# Patient Record
Sex: Female | Born: 1970 | Race: White | Hispanic: No | Marital: Married | Smoking: Former smoker
Health system: Southern US, Community
[De-identification: ages and names within clinical notes are randomized; demographics above are authoritative.]

## PROBLEM LIST (undated history)

## (undated) DIAGNOSIS — F329 Major depressive disorder, single episode, unspecified: Secondary | ICD-10-CM

## (undated) DIAGNOSIS — T7840XA Allergy, unspecified, initial encounter: Secondary | ICD-10-CM

## (undated) DIAGNOSIS — E1169 Type 2 diabetes mellitus with other specified complication: Secondary | ICD-10-CM

## (undated) DIAGNOSIS — R519 Headache, unspecified: Secondary | ICD-10-CM

## (undated) DIAGNOSIS — I671 Cerebral aneurysm, nonruptured: Secondary | ICD-10-CM

## (undated) DIAGNOSIS — Q282 Arteriovenous malformation of cerebral vessels: Secondary | ICD-10-CM

## (undated) DIAGNOSIS — F419 Anxiety disorder, unspecified: Secondary | ICD-10-CM

## (undated) DIAGNOSIS — Z8619 Personal history of other infectious and parasitic diseases: Secondary | ICD-10-CM

## (undated) DIAGNOSIS — K219 Gastro-esophageal reflux disease without esophagitis: Secondary | ICD-10-CM

## (undated) DIAGNOSIS — R51 Headache: Secondary | ICD-10-CM

## (undated) DIAGNOSIS — E039 Hypothyroidism, unspecified: Secondary | ICD-10-CM

## (undated) DIAGNOSIS — M179 Osteoarthritis of knee, unspecified: Secondary | ICD-10-CM

## (undated) DIAGNOSIS — E079 Disorder of thyroid, unspecified: Secondary | ICD-10-CM

## (undated) DIAGNOSIS — G4730 Sleep apnea, unspecified: Secondary | ICD-10-CM

## (undated) DIAGNOSIS — G43909 Migraine, unspecified, not intractable, without status migrainosus: Secondary | ICD-10-CM

## (undated) DIAGNOSIS — H5440 Blindness, one eye, unspecified eye: Secondary | ICD-10-CM

## (undated) DIAGNOSIS — F32A Depression, unspecified: Secondary | ICD-10-CM

## (undated) DIAGNOSIS — G629 Polyneuropathy, unspecified: Secondary | ICD-10-CM

## (undated) DIAGNOSIS — I10 Essential (primary) hypertension: Secondary | ICD-10-CM

## (undated) DIAGNOSIS — M1710 Unilateral primary osteoarthritis, unspecified knee: Secondary | ICD-10-CM

## (undated) HISTORY — DX: Allergy, unspecified, initial encounter: T78.40XA

## (undated) HISTORY — PX: OTHER SURGICAL HISTORY: SHX169

## (undated) HISTORY — DX: Polyneuropathy, unspecified: G62.9

## (undated) HISTORY — DX: Personal history of other infectious and parasitic diseases: Z86.19

## (undated) HISTORY — DX: Disorder of thyroid, unspecified: E07.9

## (undated) HISTORY — DX: Migraine, unspecified, not intractable, without status migrainosus: G43.909

---

## 2000-07-26 ENCOUNTER — Encounter: Payer: Self-pay | Admitting: Orthopedic Surgery

## 2000-07-26 ENCOUNTER — Ambulatory Visit (HOSPITAL_COMMUNITY): Admission: RE | Admit: 2000-07-26 | Discharge: 2000-07-26 | Payer: Self-pay | Admitting: Orthopedic Surgery

## 2000-08-06 ENCOUNTER — Encounter: Admission: RE | Admit: 2000-08-06 | Discharge: 2000-09-17 | Payer: Self-pay | Admitting: Internal Medicine

## 2000-12-10 ENCOUNTER — Encounter: Payer: Self-pay | Admitting: Internal Medicine

## 2000-12-10 ENCOUNTER — Ambulatory Visit (HOSPITAL_COMMUNITY): Admission: RE | Admit: 2000-12-10 | Discharge: 2000-12-10 | Payer: Self-pay | Admitting: Internal Medicine

## 2002-01-07 ENCOUNTER — Encounter: Payer: Self-pay | Admitting: Emergency Medicine

## 2002-01-07 ENCOUNTER — Emergency Department (HOSPITAL_COMMUNITY): Admission: EM | Admit: 2002-01-07 | Discharge: 2002-01-07 | Payer: Self-pay | Admitting: Emergency Medicine

## 2002-01-20 ENCOUNTER — Encounter: Admission: RE | Admit: 2002-01-20 | Discharge: 2002-04-04 | Payer: Self-pay | Admitting: Sports Medicine

## 2002-06-07 HISTORY — PX: DILATION AND CURETTAGE OF UTERUS: SHX78

## 2004-01-11 ENCOUNTER — Emergency Department (HOSPITAL_COMMUNITY): Admission: EM | Admit: 2004-01-11 | Discharge: 2004-01-11 | Payer: Self-pay | Admitting: Emergency Medicine

## 2006-05-26 ENCOUNTER — Encounter: Admission: RE | Admit: 2006-05-26 | Discharge: 2006-06-08 | Payer: Self-pay | Admitting: Orthopedic Surgery

## 2009-03-03 LAB — HM DIABETES EYE EXAM

## 2009-11-23 ENCOUNTER — Observation Stay (HOSPITAL_COMMUNITY): Admission: EM | Admit: 2009-11-23 | Discharge: 2009-11-24 | Payer: Self-pay | Admitting: Emergency Medicine

## 2009-11-23 ENCOUNTER — Ambulatory Visit: Payer: Self-pay | Admitting: Family Medicine

## 2009-12-12 ENCOUNTER — Telehealth: Payer: Self-pay | Admitting: Family Medicine

## 2010-04-02 NOTE — Progress Notes (Signed)
  Phone Note From Pharmacy   Caller: Kalman Jewels- Pharmacist @ [facility] Summary of Call: Karrie Meres pharmacist from [place] called about a rx written by you on this patient. Isosobide Mononitrate is written as needed, pharmacist states that that is usually dosed 2 times per day. SAhe was wondering if you wanted the sublingual type that is instant relief or if you are wanting the 2 times a day dose. phone number is [phone]. Any pharmacist can take the rx info. Referre3nce # is [id number]. Initial call taken by: Jimmy Footman, CMA,  December 12, 2009 4:12 PM  Follow-up for Phone Call        I texted md as [facility] has called again & they want an answer asap Follow-up by: Golden Circle RN,  December 13, 2009 3:48 PM    New/Updated Medications: NITROSTAT 0.4 MG SUBL (NITROGLYCERIN) take up to 3 tab q28min for chest pain. Prescriptions: NITROSTAT 0.4 MG SUBL (NITROGLYCERIN) take up to 3 tab q7min for chest pain.  #3 x 0   Entered and Authorized by:   Khamron Gellert de Lawson Radar  MD   Signed by:   Barnabas Lister  MD on 12/14/2009   Method used:   Telephoned to ...         RxID:   [id number]

## 2010-05-16 LAB — URINALYSIS, ROUTINE W REFLEX MICROSCOPIC
Glucose, UA: NEGATIVE mg/dL
Nitrite: NEGATIVE
pH: 8 (ref 5.0–8.0)

## 2010-05-16 LAB — CBC
HCT: 39.5 % (ref 36.0–46.0)
HCT: 41.6 % (ref 36.0–46.0)
Hemoglobin: 12.3 g/dL (ref 12.0–15.0)
MCH: 24.5 pg — ABNORMAL LOW (ref 26.0–34.0)
MCHC: 31.1 g/dL (ref 30.0–36.0)
MCV: 75.4 fL — ABNORMAL LOW (ref 78.0–100.0)
Platelets: 273 10*3/uL (ref 150–400)
RBC: 5.18 MIL/uL — ABNORMAL HIGH (ref 3.87–5.11)
RBC: 5.52 MIL/uL — ABNORMAL HIGH (ref 3.87–5.11)
RDW: 18.5 % — ABNORMAL HIGH (ref 11.5–15.5)
WBC: 9.2 10*3/uL (ref 4.0–10.5)

## 2010-05-16 LAB — POCT CARDIAC MARKERS
CKMB, poc: <1 ng/mL — ABNORMAL LOW (ref 1.0–8.0)
Myoglobin, poc: 101 ng/mL (ref 12–200)
Troponin i, poc: <0.05 ng/mL (ref 0.00–0.09)
Troponin i, poc: <0.05 ng/mL (ref 0.00–0.09)

## 2010-05-16 LAB — RAPID URINE DRUG SCREEN, HOSP PERFORMED
Barbiturates: NOT DETECTED
Benzodiazepines: POSITIVE — AB
Cocaine: NOT DETECTED

## 2010-05-16 LAB — TSH: TSH: 4.678 u[IU]/mL — ABNORMAL HIGH (ref 0.350–4.500)

## 2010-05-16 LAB — CARDIAC PANEL(CRET KIN+CKTOT+MB+TROPI)
CK, MB: 0.5 ng/mL (ref 0.3–4.0)
CK, MB: 0.6 ng/mL (ref 0.3–4.0)
Total CK: 50 U/L (ref 7–177)
Total CK: 57 U/L (ref 7–177)
Troponin I: 0.01 ng/mL (ref 0.00–0.06)

## 2010-05-16 LAB — LIPID PANEL
LDL Cholesterol: 86 mg/dL (ref 0–99)
VLDL: 22 mg/dL (ref 0–40)

## 2010-05-16 LAB — BASIC METABOLIC PANEL
BUN: 12 mg/dL (ref 6–23)
CO2: 25 mEq/L (ref 19–32)
Chloride: 109 mEq/L (ref 96–112)
GFR calc non Af Amer: >60 mL/min (ref >60)
Glucose, Bld: 136 mg/dL — ABNORMAL HIGH (ref 70–99)
Potassium: 4.2 mEq/L (ref 3.5–5.1)
Sodium: 140 mEq/L (ref 135–145)

## 2010-05-16 LAB — POCT I-STAT, CHEM 8
BUN: 10 mg/dL (ref 6–23)
Calcium, Ion: 1.04 mmol/L — ABNORMAL LOW (ref 1.12–1.32)
Chloride: 111 mEq/L (ref 96–112)
Glucose, Bld: 97 mg/dL (ref 70–99)
TCO2: 20 mmol/L (ref 0–100)

## 2010-05-16 LAB — HEMOGLOBIN A1C
Hgb A1c MFr Bld: 6.6 % — ABNORMAL HIGH (ref <5.7)
Mean Plasma Glucose: 143 mg/dL — ABNORMAL HIGH (ref <117)

## 2010-05-16 LAB — DIFFERENTIAL
Eosinophils Absolute: 0.1 10*3/uL (ref 0.0–0.7)
Eosinophils Relative: 1 % (ref 0–5)
Lymphocytes Relative: 24 % (ref 12–46)
Lymphs Abs: 2.1 10*3/uL (ref 0.7–4.0)
Monocytes Absolute: 0.6 10*3/uL (ref 0.1–1.0)

## 2010-05-16 LAB — POCT PREGNANCY, URINE: Preg Test, Ur: NEGATIVE

## 2010-05-16 LAB — D-DIMER, QUANTITATIVE: D-Dimer, Quant: 0.42 ug/mL-FEU (ref 0.00–0.48)

## 2011-07-11 ENCOUNTER — Ambulatory Visit: Payer: Self-pay | Admitting: Endocrinology

## 2011-07-23 ENCOUNTER — Ambulatory Visit: Payer: Self-pay | Admitting: Endocrinology

## 2011-08-01 ENCOUNTER — Ambulatory Visit (INDEPENDENT_AMBULATORY_CARE_PROVIDER_SITE_OTHER): Payer: Managed Care, Other (non HMO) | Admitting: Endocrinology

## 2011-08-01 ENCOUNTER — Encounter: Payer: Self-pay | Admitting: Endocrinology

## 2011-08-01 VITALS — BP 124/72 | HR 97 | Temp 98.0°F | Ht 66.0 in | Wt 356.0 lb

## 2011-08-01 DIAGNOSIS — E039 Hypothyroidism, unspecified: Secondary | ICD-10-CM | POA: Insufficient documentation

## 2011-08-01 DIAGNOSIS — F411 Generalized anxiety disorder: Secondary | ICD-10-CM | POA: Insufficient documentation

## 2011-08-01 DIAGNOSIS — R635 Abnormal weight gain: Secondary | ICD-10-CM

## 2011-08-01 DIAGNOSIS — E559 Vitamin D deficiency, unspecified: Secondary | ICD-10-CM | POA: Insufficient documentation

## 2011-08-01 DIAGNOSIS — H579 Unspecified disorder of eye and adnexa: Secondary | ICD-10-CM

## 2011-08-01 DIAGNOSIS — E1139 Type 2 diabetes mellitus with other diabetic ophthalmic complication: Secondary | ICD-10-CM | POA: Insufficient documentation

## 2011-08-01 LAB — HM MAMMOGRAPHY

## 2011-08-01 MED ORDER — DEXAMETHASONE 1 MG PO TABS: ORAL_TABLET | ORAL | Status: DC

## 2011-08-01 NOTE — Progress Notes (Signed)
Subjective:    Patient ID: Olivia Payne, female    DOB: December 09, 1970, 41 y.o.   MRN: [id number]  HPI pt states 2 years h/o dm, complicated by peripheral sensory neuropathy.  she has never been on insulin.  pt says her diet is good, but exercise is poor.  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  she brings a record of her cbg's which i have reviewed today.  It varies from 150-220.   Pt states few years of intermittent severe anxiety, worse in the context of work, and assoc chest pain.   Past Medical History  Diagnosis Date  . Asthma   . History of chicken pox   . Migraine   . Allergy   . Thyroid disease   . Neuropathy     Past Surgical History  Procedure Date  . Dilation and curettage of uterus 06/07/2002    History   Social History  . Marital Status: Married    Spouse Name: N/A    Number of Children: 2  . Years of Education: N/A   Occupational History  . ANALYST [employer]   Social History Main Topics  . Smoking status: Former Games developer  . Smokeless tobacco: Not on file  . Alcohol Use: No  . Drug Use: No  . Sexually Active: Not on file   Other Topics Concern  . Not on file   Social History Narrative   Caffeine Use-no    Current Outpatient Prescriptions on File Prior to Visit  Medication Sig Dispense Refill  . levothyroxine (SYNTHROID, LEVOTHROID) 50 MCG tablet Take 50 mcg by mouth daily.       . medroxyPROGESTERone (DEPO-PROVERA) 150 MG/ML injection Inject 150 mg into the muscle every 3 (three) months.       . pregabalin (LYRICA) 50 MG capsule Take 50 mg by mouth as needed.      . topiramate (TOPAMAX) 50 MG tablet Take 50 mg by mouth daily.         Allergies  Allergen Reactions  . Latex     Family History  Problem Relation Age of Onset  . Cancer Mother     Breast Cancer  . Diabetes Father   . Heart disease Father   . Heart attack Father 41  . Cancer Paternal Aunt     Ovarian Cancer  . Diabetes Paternal  Grandmother   . Cancer Other     Lung Cancer-Maternal side of family    BP 124/72  Pulse 97  Temp(Src) 98 F (36.7 C) (Oral)  Ht 5\' 6"  (1.676 m)  Wt 356 lb (161.481 kg)  BMI 57.46 kg/m2  SpO2 97%  Review of Systems denies chest pain, sob, n/v, urinary frequency, excessive diaphoresis, memory loss, depression.  She has parestheias of the feet, muscle cramps, rhinorrhea, easy bruising, headache, and blurry vsion.  She has lost weight, due to her efforts.  No menses due to depo-provera.    Objective:   Physical Exam VS: see vs page GEN: no distress.  morbid obesity HEAD: head: no deformity eyes: no periorbital swelling, no proptosis external nose and ears are normal mouth: no lesion seen NECK: supple, thyroid is not enlarged CHEST WALL: no deformity LUNGS:  Clear to auscultation CV: reg rate and rhythm, no murmur ABD: abdomen is soft, nontender.  no hepatosplenomegaly.  not distended.  no hernia MUSCULOSKELETAL: muscle bulk and strength are grossly normal.  no obvious joint swelling.  gait is normal  and steady EXTEMITIES: no deformity.  no ulcer on the feet.  feet are of normal color and temp.  1+ bilat leg edema PULSES: dorsalis pedis intact bilat.  no carotid bruit NEURO:  cn 2-12 grossly intact.   readily moves all 4's.  sensation is intact to touch on the feet, but slightly decreased from normal SKIN:  Normal texture and temperature.  No rash or suspicious lesion is visible.   NODES:  None palpable at the neck PSYCH: alert, oriented x3.  Does not appear anxious nor depressed.  (i reviewed records from dr Reuel Boom).    Assessment & Plan:  DM, with apparently worsening control Chronic weight gain, uncertain etiology Anxiety.  This limits the rx of DM Hypothyroidism.  Well-replaced as of most recent labs from dr Reuel Boom.

## 2011-08-01 NOTE — Patient Instructions (Addendum)
good diet and exercise habits significanly improve the control of your diabetes.  please let me know if you wish to be referred to a dietician.  high blood sugar is very risky to your health.  you should see an eye doctor every year. controlling your blood pressure and cholesterol drastically reduces the damage diabetes does to your body.  this also applies to quitting smoking.  please discuss these with your doctor.  you should take an aspirin every day, unless you have been advised by a doctor not to. check your blood sugar once a day.  vary the time of day when you check, between before the 3 meals, and at bedtime.  also check if you have symptoms of your blood sugar being too high or too low.  please keep a record of the readings and bring it to your next appointment here.  please call us sooner if your blood sugar goes below 70, or if it stays over 200. blood tests are being requested for you today.  You will receive a letter with results. you should do a "dexamethasone suppression test."  for this, you would take dexamethasone 1 mg at 10 pm, then come in for a "cortisol" blood test the next morning before 9 am.  you do not need to be fasting for this test.

## 2011-08-22 ENCOUNTER — Other Ambulatory Visit (INDEPENDENT_AMBULATORY_CARE_PROVIDER_SITE_OTHER): Payer: Managed Care, Other (non HMO)

## 2011-08-22 ENCOUNTER — Other Ambulatory Visit: Payer: Self-pay | Admitting: *Deleted

## 2011-08-22 DIAGNOSIS — R635 Abnormal weight gain: Secondary | ICD-10-CM

## 2011-08-22 DIAGNOSIS — H579 Unspecified disorder of eye and adnexa: Secondary | ICD-10-CM

## 2011-08-22 DIAGNOSIS — E1139 Type 2 diabetes mellitus with other diabetic ophthalmic complication: Secondary | ICD-10-CM

## 2011-08-22 DIAGNOSIS — E559 Vitamin D deficiency, unspecified: Secondary | ICD-10-CM

## 2011-08-22 DIAGNOSIS — E039 Hypothyroidism, unspecified: Secondary | ICD-10-CM

## 2011-08-22 LAB — HEMOGLOBIN A1C: Hgb A1c MFr Bld: 7.5 % — ABNORMAL HIGH (ref 4.6–6.5)

## 2011-08-22 LAB — TSH: TSH: 1.65 u[IU]/mL (ref 0.35–5.50)

## 2011-08-22 LAB — CORTISOL: Cortisol, Plasma: 0.6 ug/dL

## 2011-08-23 LAB — VITAMIN D 25 HYDROXY (VIT D DEFICIENCY, FRACTURES): Vit D, 25-Hydroxy: 21 ng/mL — ABNORMAL LOW (ref 30–89)

## 2011-08-25 ENCOUNTER — Encounter: Payer: Self-pay | Admitting: Endocrinology

## 2011-08-25 ENCOUNTER — Other Ambulatory Visit: Payer: Self-pay | Admitting: Endocrinology

## 2011-08-25 MED ORDER — METFORMIN HCL ER 500 MG PO TB24: 500.0000 mg | ORAL_TABLET | Freq: Every day | ORAL | Status: DC

## 2011-08-26 ENCOUNTER — Other Ambulatory Visit: Payer: Self-pay

## 2011-08-26 MED ORDER — METFORMIN HCL ER 500 MG PO TB24: 500.0000 mg | ORAL_TABLET | Freq: Every day | ORAL | Status: DC

## 2011-08-26 NOTE — Telephone Encounter (Signed)
Rx for Metformin faxed to [facility], pt informed to contact PCP for Lyrica refill, as SAE refuses to refill.

## 2011-08-26 NOTE — Telephone Encounter (Signed)
Pt called requesting Rx refill of Lyrica to treat neuropathy related to Diabetes, please advise.

## 2011-08-26 NOTE — Addendum Note (Signed)
Addended by: Anselm Jungling on: 08/26/2011 11:50 AM   Modules accepted: Orders

## 2011-09-11 ENCOUNTER — Telehealth: Payer: Self-pay | Admitting: *Deleted

## 2011-09-11 NOTE — Telephone Encounter (Signed)
Pt states that her last Cortisol levels are not normal and she wants to know why an ACTH level wasn't done when she came in for her paperwork. Pt was very combative and agitated on the phone and would like a callback from MD explaining this.

## 2011-09-11 NOTE — Telephone Encounter (Signed)
i called pt 09/11/11.  i explained to pt that a normal cortisol the am after decadron is less than 5.  This excludes cushing's, and no further testing is needed.

## 2012-03-17 ENCOUNTER — Encounter (HOSPITAL_COMMUNITY): Payer: Self-pay | Admitting: Emergency Medicine

## 2012-03-17 ENCOUNTER — Emergency Department (INDEPENDENT_AMBULATORY_CARE_PROVIDER_SITE_OTHER): Payer: Managed Care, Other (non HMO)

## 2012-03-17 ENCOUNTER — Emergency Department (HOSPITAL_COMMUNITY)
Admission: EM | Admit: 2012-03-17 | Discharge: 2012-03-17 | Disposition: A | Discharge status: Home / Self Care | Payer: Managed Care, Other (non HMO) | Source: Home / Self Care | Attending: Family Medicine | Admitting: Family Medicine

## 2012-03-17 DIAGNOSIS — J45901 Unspecified asthma with (acute) exacerbation: Secondary | ICD-10-CM

## 2012-03-17 MED ORDER — METHYLPREDNISOLONE SODIUM SUCC 125 MG IJ SOLR
125.0000 mg | Freq: Once | INTRAMUSCULAR | Status: AC
  Administered 2012-03-17: 125 mg via INTRAMUSCULAR

## 2012-03-17 MED ORDER — ALBUTEROL SULFATE (5 MG/ML) 0.5% IN NEBU
INHALATION_SOLUTION | RESPIRATORY_TRACT | Status: AC
  Filled 2012-03-17: qty 1

## 2012-03-17 MED ORDER — MOXIFLOXACIN HCL 400 MG PO TABS: 400.0000 mg | ORAL_TABLET | Freq: Every day | ORAL | Status: DC

## 2012-03-17 MED ORDER — ALBUTEROL SULFATE (5 MG/ML) 0.5% IN NEBU
5.0000 mg | INHALATION_SOLUTION | Freq: Once | RESPIRATORY_TRACT | Status: AC
  Administered 2012-03-17: 5 mg via RESPIRATORY_TRACT

## 2012-03-17 MED ORDER — IPRATROPIUM BROMIDE 0.02 % IN SOLN
0.5000 mg | Freq: Once | RESPIRATORY_TRACT | Status: AC
  Administered 2012-03-17: 0.5 mg via RESPIRATORY_TRACT

## 2012-03-17 MED ORDER — METHYLPREDNISOLONE SODIUM SUCC 125 MG IJ SOLR
INTRAMUSCULAR | Status: AC
  Filled 2012-03-17: qty 2

## 2012-03-17 NOTE — ED Notes (Signed)
Pt c/o asthma/cough since yest Sx include: SOB, vomiting, cough w/yellow mucous, ribs hurting due to cough Denies: fevers, diarrhea  She is alert w/mild discomfort due to cough

## 2012-03-17 NOTE — ED Provider Notes (Signed)
History     CSN: [id number]  Arrival date & time 03/17/12  4098   First MD Initiated Contact with Patient 03/17/12 1908      Chief Complaint  Patient presents with  . Asthma    (Consider location/radiation/quality/duration/timing/severity/associated sxs/prior treatment) Patient is a 42 y.o. female presenting with asthma. The history is provided by the patient.  Asthma This is a new problem. The current episode started more than 2 days ago. The problem has been gradually worsening. Associated symptoms include shortness of breath. Pertinent negatives include no chest pain. The symptoms are aggravated by coughing.    Past Medical History  Diagnosis Date  . Asthma   . History of chicken pox   . Migraine   . Allergy   . Thyroid disease   . Neuropathy     Past Surgical History  Procedure Date  . Dilation and curettage of uterus 06/07/2002    Family History  Problem Relation Age of Onset  . Cancer Mother     Breast Cancer  . Diabetes Father   . Heart disease Father   . Heart attack Father 72  . Cancer Paternal Aunt     Ovarian Cancer  . Diabetes Paternal Grandmother   . Cancer Other     Lung Cancer-Maternal side of family    History  Substance Use Topics  . Smoking status: Former Games developer  . Smokeless tobacco: Not on file  . Alcohol Use: No    OB History    Grav Para Term Preterm Abortions TAB SAB Ect Mult Living                  Review of Systems  Constitutional: Negative.   HENT: Positive for congestion and rhinorrhea. Negative for sore throat.   Respiratory: Positive for cough, shortness of breath and wheezing.   Cardiovascular: Negative for chest pain and leg swelling.  Gastrointestinal: Positive for vomiting. Negative for nausea.    Allergies  Latex  Home Medications   Current Outpatient Rx  Name  Route  Sig  Dispense  Refill  . DEXAMETHASONE 1 MG PO TABS      Take at 10 pm, the night before blood test   1 tablet   0   . LEVOTHYROXINE  SODIUM 50 MCG PO TABS   Oral   Take 50 mcg by mouth daily.          Marland Kitchen LORAZEPAM 1 MG PO TABS   Oral   Take 1 mg by mouth 4 (four) times daily as needed.          Marland Kitchen MEDROXYPROGESTERONE ACETATE 150 MG/ML IM SUSP   Intramuscular   Inject 150 mg into the muscle every 3 (three) months.          . METFORMIN HCL ER 500 MG PO TB24   Oral   Take 1 tablet (500 mg total) by mouth daily with breakfast.   90 tablet   3   . MOXIFLOXACIN HCL 400 MG PO TABS   Oral   Take 1 tablet (400 mg total) by mouth daily. One tab daily   7 tablet   0   . PREGABALIN 50 MG PO CAPS   Oral   Take 50 mg by mouth as needed.         . TOPIRAMATE 50 MG PO TABS   Oral   Take 50 mg by mouth daily.            BP 113/56  Pulse  92  Temp 98.6 F (37 C) (Oral)  Resp 18  SpO2 97%  Physical Exam  Nursing note and vitals reviewed. Constitutional: She is oriented to person, place, and time. She appears well-developed and well-nourished. She appears distressed.       Harsh spasmodic cough.  HENT:  Head: Normocephalic.  Right Ear: External ear normal.  Left Ear: External ear normal.  Mouth/Throat: Oropharynx is clear and moist.  Eyes: Conjunctivae normal are normal. Pupils are equal, round, and reactive to light.  Neck: Normal range of motion. Neck supple.  Cardiovascular: Normal rate, regular rhythm, normal heart sounds and intact distal pulses.   Pulmonary/Chest: She has wheezes. She has rhonchi.  Lymphadenopathy:    She has no cervical adenopathy.  Neurological: She is alert and oriented to person, place, and time.  Skin: Skin is warm and dry.    ED Course  Procedures (including critical care time)  Labs Reviewed - No data to display Dg Chest 2 View  03/17/2012  *RADIOLOGY REPORT*  Clinical Data: Cough.  Asthma.  CHEST - 2 VIEW  Comparison: None.  Findings: Heart size is upper normal.  There is a prominent right paratracheal stripe.  There are no prior chest imaging studies for  comparison.  There is bilateral peribronchial thickening.  The lung volumes appear normal.  No focal airspace opacity, pneumothorax, or pleural effusion.  Negative for pneumomediastinum.  No acute bony abnormality.  IMPRESSION: 1. Prominent right paratracheal stripe.  Considerations include paratracheal lymphadenopathy, prominent vascular structures, or possibly prominent focal atelectasis.  Consider comparison with prior outside chest radiographs if available or short-term follow- up chest radiograph after the patient's current illness resolves. 2.  Bilateral peribronchial thickening.  This can be seen in the setting of asthma, smoking, or bronchitis.   Original Report Authenticated By: Britta Mccreedy, M.D.      1. Asthmatic bronchitis with exacerbation       MDM  X-rays reviewed and report per radiologist. Cough resolved and lungs clear after neb at time of d/c.feels much better.        Linna Hoff, MD 03/17/12 2011

## 2012-03-19 ENCOUNTER — Other Ambulatory Visit (HOSPITAL_COMMUNITY): Payer: Self-pay | Admitting: Interventional Radiology

## 2012-03-19 ENCOUNTER — Encounter (HOSPITAL_COMMUNITY): Payer: Self-pay | Admitting: Pharmacy Technician

## 2012-03-19 DIAGNOSIS — Q2730 Arteriovenous malformation, site unspecified: Secondary | ICD-10-CM

## 2012-03-22 ENCOUNTER — Other Ambulatory Visit: Payer: Self-pay | Admitting: Radiology

## 2012-03-23 ENCOUNTER — Inpatient Hospital Stay (HOSPITAL_COMMUNITY): Admission: RE | Admit: 2012-03-23 | Payer: Managed Care, Other (non HMO) | Source: Ambulatory Visit

## 2012-03-30 ENCOUNTER — Other Ambulatory Visit: Payer: Self-pay | Admitting: Radiology

## 2012-04-01 ENCOUNTER — Other Ambulatory Visit: Payer: Self-pay | Admitting: Radiology

## 2012-04-02 ENCOUNTER — Other Ambulatory Visit (HOSPITAL_COMMUNITY): Payer: Self-pay | Admitting: Interventional Radiology

## 2012-04-02 ENCOUNTER — Encounter (HOSPITAL_COMMUNITY): Payer: Self-pay

## 2012-04-02 ENCOUNTER — Ambulatory Visit (HOSPITAL_COMMUNITY)
Admission: RE | Admit: 2012-04-02 | Discharge: 2012-04-02 | Disposition: A | Discharge status: Home / Self Care | Payer: Managed Care, Other (non HMO) | Source: Ambulatory Visit | Attending: Interventional Radiology | Admitting: Interventional Radiology

## 2012-04-02 DIAGNOSIS — Q2730 Arteriovenous malformation, site unspecified: Secondary | ICD-10-CM

## 2012-04-02 DIAGNOSIS — R51 Headache: Secondary | ICD-10-CM | POA: Insufficient documentation

## 2012-04-02 DIAGNOSIS — Q283 Other malformations of cerebral vessels: Secondary | ICD-10-CM | POA: Insufficient documentation

## 2012-04-02 LAB — BASIC METABOLIC PANEL
CO2: 22 mEq/L (ref 19–32)
Calcium: 9 mg/dL (ref 8.4–10.5)
GFR calc non Af Amer: >90 mL/min (ref >90)
Glucose, Bld: 148 mg/dL — ABNORMAL HIGH (ref 70–99)
Potassium: 3.7 mEq/L (ref 3.5–5.1)
Sodium: 134 mEq/L — ABNORMAL LOW (ref 135–145)

## 2012-04-02 LAB — CBC WITH DIFFERENTIAL/PLATELET
Eosinophils Absolute: 0.2 10*3/uL (ref 0.0–0.7)
Hemoglobin: 13.9 g/dL (ref 12.0–15.0)
Lymphocytes Relative: 30 % (ref 12–46)
Lymphs Abs: 3.6 10*3/uL (ref 0.7–4.0)
MCH: 25 pg — ABNORMAL LOW (ref 26.0–34.0)
MCV: 75.5 fL — ABNORMAL LOW (ref 78.0–100.0)
Monocytes Relative: 7 % (ref 3–12)
Neutrophils Relative %: 62 % (ref 43–77)
Platelets: 246 10*3/uL (ref 150–400)
RBC: 5.55 MIL/uL — ABNORMAL HIGH (ref 3.87–5.11)
WBC: 12 10*3/uL — ABNORMAL HIGH (ref 4.0–10.5)

## 2012-04-02 LAB — PROTIME-INR
INR: 0.96 (ref 0.00–1.49)
Prothrombin Time: 12.7 seconds (ref 11.6–15.2)

## 2012-04-02 LAB — APTT: aPTT: 25 seconds (ref 24–37)

## 2012-04-02 MED ORDER — HYDROCODONE-ACETAMINOPHEN 5-325 MG PO TABS
ORAL_TABLET | ORAL | Status: AC
  Administered 2012-04-02: 1 via ORAL
  Filled 2012-04-02: qty 1

## 2012-04-02 MED ORDER — FENTANYL CITRATE 0.05 MG/ML IJ SOLN
INTRAMUSCULAR | Status: AC | PRN
  Administered 2012-04-02 (×2): 25 ug via INTRAVENOUS

## 2012-04-02 MED ORDER — HYDROCODONE-ACETAMINOPHEN 5-325 MG PO TABS: 1.0000 | ORAL_TABLET | Freq: Once | ORAL | Status: DC

## 2012-04-02 MED ORDER — SODIUM CHLORIDE 0.9 % IV SOLN: Freq: Once | INTRAVENOUS | Status: DC

## 2012-04-02 MED ORDER — SODIUM CHLORIDE 0.9 % IV SOLN: INTRAVENOUS | Status: AC

## 2012-04-02 MED ORDER — MIDAZOLAM HCL 2 MG/2ML IJ SOLN
INTRAMUSCULAR | Status: AC | PRN
  Administered 2012-04-02: 1 mg via INTRAVENOUS

## 2012-04-02 MED ORDER — HEPARIN SODIUM (PORCINE) 1000 UNIT/ML IJ SOLN
INTRAMUSCULAR | Status: AC | PRN
  Administered 2012-04-02: 1000 [IU] via INTRAVENOUS

## 2012-04-02 MED ORDER — IOHEXOL 300 MG/ML  SOLN
150.0000 mL | Freq: Once | INTRAMUSCULAR | Status: AC | PRN
  Administered 2012-04-02: 80 mL via INTRA_ARTERIAL

## 2012-04-02 NOTE — H&P (Signed)
Olivia Payne is an 42 y.o. female.   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 Asthma   . History of chicken pox   . Migraine   . Allergy   . Thyroid disease   . Neuropathy     Past Surgical History  Procedure Date  . Dilation and curettage of uterus 06/07/2002    Family History  Problem Relation Age of Onset  . Cancer Mother     Breast Cancer  . Diabetes Father   . Heart disease Father   . Heart attack Father 29  . Cancer Paternal Aunt     Ovarian Cancer  . Diabetes Paternal Grandmother   . Cancer Other     Lung Cancer-Maternal side of family   Social History:  reports that she has quit smoking. She does not have any smokeless tobacco history on file. She reports that she does not drink alcohol or use illicit drugs.  Allergies:  Allergies  Allergen Reactions  . Latex Other (See Comments)    "SEVERE REACTION"     (Not in a hospital admission)  Results for orders placed during the hospital encounter of 04/02/12 (from the past 48 hour(s))  APTT     Status: Normal   Collection Time   04/02/12  7:51 AM      Component Value Range Comment   aPTT 25  24 - 37 seconds   BASIC METABOLIC PANEL     Status: Abnormal   Collection Time   04/02/12  7:51 AM      Component Value Range Comment   Sodium 134 (*) 135 - 145 mEq/L    Potassium 3.7  3.5 - 5.1 mEq/L    Chloride 101  96 - 112 mEq/L    CO2 22  19 - 32 mEq/L    Glucose, Bld 148 (*) 70 - 99 mg/dL    BUN 13  6 - 23 mg/dL    Creatinine, Ser 4.09  0.50 - 1.10 mg/dL    Calcium 9.0  8.4 - 81.1 mg/dL    GFR calc non Af Amer >90  >90  mL/min    GFR calc Af Amer >90  >90 mL/min   CBC WITH DIFFERENTIAL     Status: Abnormal   Collection Time   04/02/12  7:51 AM      Component Value Range Comment   WBC 12.0 (*) 4.0 - 10.5 K/uL    RBC 5.55 (*) 3.87 - 5.11 MIL/uL    Hemoglobin 13.9  12.0 - 15.0 g/dL    HCT 91.4  78.2 - 95.6 %    MCV 75.5 (*) 78.0 - 100.0 fL    MCH 25.0 (*) 26.0 - 34.0 pg    MCHC 33.2  30.0 - 36.0 g/dL    RDW 21.3 (*) 08.6 - 15.5 %    Platelets 246  150 - 400 K/uL    Neutrophils Relative 62  43 - 77 %    Neutro Abs 7.4  1.7 - 7.7 K/uL    Lymphocytes Relative 30  12 - 46 %    Lymphs Abs 3.6  0.7 - 4.0 K/uL    Monocytes  Relative 7  3 - 12 %    Monocytes Absolute 0.8  0.1 - 1.0 K/uL    Eosinophils Relative 1  0 - 5 %    Eosinophils Absolute 0.2  0.0 - 0.7 K/uL    Basophils Relative 0  0 - 1 %    Basophils Absolute 0.0  0.0 - 0.1 K/uL   PROTIME-INR     Status: Normal   Collection Time   04/02/12  7:51 AM      Component Value Range Comment   Prothrombin Time 12.7  11.6 - 15.2 seconds    INR 0.96  0.00 - 1.49   GLUCOSE, CAPILLARY     Status: Abnormal   Collection Time   04/02/12  7:57 AM      Component Value Range Comment   Glucose-Capillary 142 (*) 70 - 99 mg/dL    No results found.  Review of Systems  Constitutional: Negative for fever and chills.  Eyes: Positive for blurred vision and pain.  Respiratory: Positive for cough and sputum production.   Cardiovascular: Negative for chest pain.  Gastrointestinal: Negative for nausea, vomiting and abdominal pain.  Musculoskeletal: Positive for back pain.  Neurological: Positive for headaches.    Blood pressure 123/75, pulse 107, temperature 96.8 F (36 C), temperature source Oral, resp. rate 18, height 5\' 7"  (1.702 m), weight 333 lb (151.048 kg), SpO2 95.00%. Physical Exam  Constitutional: She is oriented to person, place, and time. She appears well-nourished.       obese  Cardiovascular: Normal rate, regular rhythm and normal heart sounds.   No  murmur heard. Respiratory: Effort normal and breath sounds normal. She has no wheezes.  GI: Soft. Bowel sounds are normal. She exhibits distension. There is no tenderness.  Musculoskeletal: Normal range of motion.  Neurological: She is alert and oriented to person, place, and time.  Skin: Skin is warm and dry.  Psychiatric: She has a normal mood and affect. Her behavior is normal. Judgment and thought content normal.     Assessment/Plan Headaches worsening x 2-3 months MRI reveals Rt temporal AVM Scheduled for Cer Arteriogram Pt aware of procedure benefits and risks and agreeable to proceed consent signed and in chart   Lizanne Erker A 04/02/2012, 8:52 AM

## 2012-04-02 NOTE — ED Notes (Signed)
MD informed last NPO of 20 oz water at 0615.  Ok to continue with procedure and sedation.

## 2012-04-02 NOTE — Procedures (Signed)
S/P 4 vessel cerebral arteriogram RT CFA approach. 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

## 2012-04-02 NOTE — Discharge Instructions (Signed)

## 2012-04-05 ENCOUNTER — Telehealth (HOSPITAL_COMMUNITY): Payer: Self-pay

## 2012-04-06 ENCOUNTER — Other Ambulatory Visit: Payer: Self-pay | Admitting: Neurosurgery

## 2012-04-06 ENCOUNTER — Encounter (HOSPITAL_COMMUNITY): Payer: Self-pay | Admitting: Pharmacy Technician

## 2012-04-07 ENCOUNTER — Other Ambulatory Visit (HOSPITAL_COMMUNITY): Payer: Self-pay | Admitting: Interventional Radiology

## 2012-04-07 DIAGNOSIS — Q2730 Arteriovenous malformation, site unspecified: Secondary | ICD-10-CM

## 2012-04-13 ENCOUNTER — Other Ambulatory Visit: Payer: Self-pay | Admitting: Radiology

## 2012-04-15 ENCOUNTER — Encounter (HOSPITAL_COMMUNITY): Payer: Managed Care, Other (non HMO)

## 2012-04-19 ENCOUNTER — Ambulatory Visit (HOSPITAL_COMMUNITY)
Admission: RE | Admit: 2012-04-19 | Discharge: 2012-04-19 | Disposition: A | Discharge status: Home / Self Care | Payer: Managed Care, Other (non HMO) | Source: Ambulatory Visit | Attending: Anesthesiology | Admitting: Anesthesiology

## 2012-04-19 ENCOUNTER — Encounter (HOSPITAL_COMMUNITY): Payer: Self-pay

## 2012-04-19 ENCOUNTER — Encounter (HOSPITAL_COMMUNITY)
Admission: RE | Admit: 2012-04-19 | Discharge: 2012-04-19 | Disposition: A | Discharge status: Home / Self Care | Payer: Managed Care, Other (non HMO) | Source: Ambulatory Visit | Attending: Interventional Radiology | Admitting: Interventional Radiology

## 2012-04-19 HISTORY — DX: Sleep apnea, unspecified: G47.30

## 2012-04-19 HISTORY — DX: Cerebral aneurysm, nonruptured: I67.1

## 2012-04-19 HISTORY — DX: Anxiety disorder, unspecified: F41.9

## 2012-04-19 HISTORY — DX: Hypothyroidism, unspecified: E03.9

## 2012-04-19 HISTORY — DX: Depression, unspecified: F32.A

## 2012-04-19 HISTORY — DX: Essential (primary) hypertension: I10

## 2012-04-19 HISTORY — DX: Major depressive disorder, single episode, unspecified: F32.9

## 2012-04-19 HISTORY — DX: Arteriovenous malformation of cerebral vessels: Q28.2

## 2012-04-19 LAB — COMPREHENSIVE METABOLIC PANEL
AST: 11 U/L (ref 0–37)
BUN: 8 mg/dL (ref 6–23)
CO2: 28 mEq/L (ref 19–32)
Calcium: 9.2 mg/dL (ref 8.4–10.5)
Chloride: 103 mEq/L (ref 96–112)
Creatinine, Ser: 0.72 mg/dL (ref 0.50–1.10)
GFR calc Af Amer: >90 mL/min (ref >90)
GFR calc non Af Amer: >90 mL/min (ref >90)
Total Bilirubin: 0.6 mg/dL (ref 0.3–1.2)

## 2012-04-19 LAB — CBC WITH DIFFERENTIAL/PLATELET
Basophils Absolute: 0 10*3/uL (ref 0.0–0.1)
Eosinophils Relative: 1 % (ref 0–5)
HCT: 40.6 % (ref 36.0–46.0)
Hemoglobin: 13.1 g/dL (ref 12.0–15.0)
Lymphocytes Relative: 28 % (ref 12–46)
MCHC: 32.3 g/dL (ref 30.0–36.0)
MCV: 76.2 fL — ABNORMAL LOW (ref 78.0–100.0)
Monocytes Absolute: 0.6 10*3/uL (ref 0.1–1.0)
Monocytes Relative: 5 % (ref 3–12)
Neutro Abs: 8.6 10*3/uL — ABNORMAL HIGH (ref 1.7–7.7)
RDW: 17.3 % — ABNORMAL HIGH (ref 11.5–15.5)
WBC: 13 10*3/uL — ABNORMAL HIGH (ref 4.0–10.5)

## 2012-04-19 LAB — PROTIME-INR
INR: 0.96 (ref 0.00–1.49)
Prothrombin Time: 12.7 seconds (ref 11.6–15.2)

## 2012-04-19 MED ORDER — DEXTROSE 5 % IV SOLN
3.0000 g | INTRAVENOUS | Status: DC
  Filled 2012-04-19: qty 3000

## 2012-04-19 NOTE — Pre-Procedure Instructions (Signed)
Olivia Payne  04/19/2012   Your procedure is scheduled on:  Tuesday, Feb 18  Report to [facility] at 0600 AM.  Call this number if you have problems the morning of surgery: [phone]   Remember:   Do not eat food or drink liquids after midnight.Monday night   Take these medicines the morning of surgery with A SIP OF WATER:lorazepam    Do not wear jewelry, make-up or nail polish.  Do not wear lotions, powders, or perfumes. You may wear deodorant.  Do not shave 48 hours prior to surgery. Men may shave face and neck.  Do not bring valuables to the hospital.  Contacts, dentures or bridgework may not be worn into surgery.  Leave suitcase in the car. After surgery it may be brought to your room.  For patients admitted to the hospital, checkout time is 11:00 AM the day of  discharge.     Special Instructions: Shower using CHG 2 nights before surgery and the night before surgery.  If you shower the day of surgery use CHG.  Use special wash - you have one bottle of CHG for all showers.  You should use approximately 1/3 of the bottle for each shower.   Please read over the following fact sheets that you were given: Pain Booklet, Coughing and Deep Breathing and Surgical Site Infection Prevention

## 2012-04-19 NOTE — Progress Notes (Deleted)
Needs new blood sample for type and screen due to antibody per diana girguis in blood bank

## 2012-04-19 NOTE — Progress Notes (Signed)
Sleep study requested from [facility]; on chart; Patient refuses to wear CPAP

## 2012-04-20 ENCOUNTER — Encounter (HOSPITAL_COMMUNITY): Payer: Self-pay | Admitting: *Deleted

## 2012-04-20 ENCOUNTER — Encounter (HOSPITAL_COMMUNITY): Payer: Self-pay | Admitting: Anesthesiology

## 2012-04-20 ENCOUNTER — Encounter (HOSPITAL_COMMUNITY)
Admission: RE | Disposition: A | Discharge status: Home / Self Care | Payer: Self-pay | Source: Ambulatory Visit | Attending: Interventional Radiology

## 2012-04-20 ENCOUNTER — Ambulatory Visit (HOSPITAL_COMMUNITY)
Admission: RE | Admit: 2012-04-20 | Discharge: 2012-04-20 | Disposition: A | Discharge status: Home / Self Care | Payer: Managed Care, Other (non HMO) | Source: Ambulatory Visit | Attending: Interventional Radiology | Admitting: Interventional Radiology

## 2012-04-20 DIAGNOSIS — Z01818 Encounter for other preprocedural examination: Secondary | ICD-10-CM | POA: Insufficient documentation

## 2012-04-20 DIAGNOSIS — Q2730 Arteriovenous malformation, site unspecified: Secondary | ICD-10-CM

## 2012-04-20 DIAGNOSIS — Z538 Procedure and treatment not carried out for other reasons: Secondary | ICD-10-CM | POA: Insufficient documentation

## 2012-04-20 DIAGNOSIS — Q283 Other malformations of cerebral vessels: Secondary | ICD-10-CM | POA: Insufficient documentation

## 2012-04-20 DIAGNOSIS — Z01812 Encounter for preprocedural laboratory examination: Secondary | ICD-10-CM | POA: Insufficient documentation

## 2012-04-20 DIAGNOSIS — Z01810 Encounter for preprocedural cardiovascular examination: Secondary | ICD-10-CM | POA: Insufficient documentation

## 2012-04-20 HISTORY — PX: RADIOLOGY WITH ANESTHESIA: SHX6223

## 2012-04-20 LAB — URINALYSIS, ROUTINE W REFLEX MICROSCOPIC
Ketones, ur: NEGATIVE mg/dL
Nitrite: NEGATIVE
Protein, ur: NEGATIVE mg/dL
Urobilinogen, UA: 1 mg/dL (ref 0.0–1.0)

## 2012-04-20 SURGERY — RADIOLOGY WITH ANESTHESIA
Anesthesia: General

## 2012-04-20 MED ORDER — CEFAZOLIN SODIUM 1-5 GM-% IV SOLN
1.0000 g | Freq: Once | INTRAVENOUS | Status: DC
Start: 2012-04-20 — End: 2012-04-20
  Filled 2012-04-20: qty 50

## 2012-04-20 MED ORDER — LACTATED RINGERS IV SOLN: INTRAVENOUS | Status: DC

## 2012-04-20 MED ORDER — SODIUM CHLORIDE 0.9 % IV SOLN: Freq: Once | INTRAVENOUS | Status: DC

## 2012-04-20 MED ORDER — ASPIRIN EC 325 MG PO TBEC
325.0000 mg | DELAYED_RELEASE_TABLET | Freq: Once | ORAL | Status: AC
  Administered 2012-04-20: 325 mg via ORAL
  Filled 2012-04-20 (×2): qty 1

## 2012-04-20 MED ORDER — NIMODIPINE 30 MG PO CAPS
60.0000 mg | ORAL_CAPSULE | ORAL | Status: DC
  Filled 2012-04-20: qty 2

## 2012-04-20 NOTE — Preoperative (Signed)
Beta Blockers   Reason not to administer Beta Blockers:Not Applicable 

## 2012-04-20 NOTE — H&P (Signed)
Olivia Payne is an 42 y.o. female.   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 Asthma   . History of chicken pox   . Migraine   . Allergy   . Thyroid disease   . Neuropathy   . Hypertension   . Hypothyroidism   . Anxiety   . Depression   . Cerebral aneurysm   . Cerebral AVM   . Sleep apnea     does not use Cpap    Past Surgical History  Procedure Laterality Date  . Dilation and curettage of uterus  06/07/2002  . Eye surgery  01/12/2013  . Cesarean section  1995  . Cerebral arteriogram      Family History  Problem Relation Age of Onset  . Cancer Mother     Breast Cancer  . Diabetes Father   . Heart disease Father   . Heart attack Father 67  . Cancer Paternal Aunt     Ovarian Cancer  . Diabetes Paternal Grandmother   . Cancer Other     Lung Cancer-Maternal side of family   Social History:  reports that she has quit smoking. She does not have any smokeless tobacco history on file. She reports that she does not drink alcohol or use illicit drugs.  Allergies:  Allergies  Allergen Reactions  . Latex Other (See Comments)    "SEVERE REACTION"     (Not in a hospital admission)  Results for orders placed during the hospital encounter of 04/19/12 (from the past 48 hour(s))  APTT     Status: None   Collection Time    04/19/12  4:30 PM      Result Value Range   aPTT 32  24 - 37 seconds  CBC WITH DIFFERENTIAL     Status: Abnormal   Collection Time    04/19/12  4:30 PM      Result Value Range   WBC 13.0 (*) 4.0 - 10.5 K/uL   RBC 5.33 (*) 3.87 - 5.11 MIL/uL   Hemoglobin 13.1  12.0 - 15.0 g/dL   HCT 16.1  09.6 - 04.5 %   MCV 76.2 (*) 78.0 - 100.0 fL   MCH 24.6 (*) 26.0 - 34.0 pg   MCHC 32.3  30.0 - 36.0  g/dL   RDW 40.9 (*) 81.1 - 91.4 %   Platelets 258  150 - 400 K/uL   Neutrophils Relative 66  43 - 77 %   Neutro Abs 8.6 (*) 1.7 - 7.7 K/uL   Lymphocytes Relative 28  12 - 46 %   Lymphs Abs 3.6  0.7 - 4.0 K/uL   Monocytes Relative 5  3 - 12 %   Monocytes Absolute 0.6  0.1 - 1.0 K/uL   Eosinophils Relative 1  0 - 5 %   Eosinophils Absolute 0.1  0.0 - 0.7 K/uL   Basophils Relative 0  0 - 1 %   Basophils Absolute 0.0  0.0 - 0.1 K/uL  COMPREHENSIVE METABOLIC PANEL     Status: Abnormal   Collection Time    04/19/12  4:30 PM      Result Value Range   Sodium 139  135 - 145 mEq/L   Potassium 4.2  3.5 - 5.1 mEq/L   Chloride 103  96 - 112 mEq/L  CO2 28  19 - 32 mEq/L   Glucose, Bld 116 (*) 70 - 99 mg/dL   BUN 8  6 - 23 mg/dL   Creatinine, Ser 1.32  0.50 - 1.10 mg/dL   Calcium 9.2  8.4 - 44.0 mg/dL   Total Protein 7.0  6.0 - 8.3 g/dL   Albumin 3.2 (*) 3.5 - 5.2 g/dL   AST 11  0 - 37 U/L   ALT 14  0 - 35 U/L   Alkaline Phosphatase 113  39 - 117 U/L   Total Bilirubin 0.6  0.3 - 1.2 mg/dL   GFR calc non Af Amer >90  >90 mL/min   GFR calc Af Amer >90  >90 mL/min   Comment:            The eGFR has been calculated     using the CKD EPI equation.     This calculation has not been     validated in all clinical     situations.     eGFR's persistently     <90 mL/min signify     possible Chronic Kidney Disease.  PROTIME-INR     Status: None   Collection Time    04/19/12  4:30 PM      Result Value Range   Prothrombin Time 12.7  11.6 - 15.2 seconds   INR 0.96  0.00 - 1.49   Dg Chest 2 View  04/19/2012  *RADIOLOGY REPORT*  Clinical Data: Preop evaluation cerebral AVM  CHEST - 2 VIEW  Comparison: 03/17/2012  Findings: Mild cardiac enlargement without heart failure.  Coarse lung markings are unchanged and may be due to chronic lung disease. Negative for pneumonia or effusion.  Negative for mass lesion. Density overlying the right medial lung apex is unchanged and  may be overlying vascular  structures such as the SVC.  IMPRESSION: Prominent lung markings consistent with chronic lung disease.  No acute abnormality and no change from the  prior study.   Original Report Authenticated By: Janeece Riggers, M.D.     Review of Systems  Constitutional: Negative for fever and chills.  Eyes: Negative for blurred vision and double vision.  Respiratory: Positive for cough. Negative for shortness of breath and wheezing.   Cardiovascular: Negative for chest pain.  Gastrointestinal: Negative for nausea and vomiting.  Genitourinary: Negative for dysuria, urgency and frequency.  Musculoskeletal: Positive for back pain.  Neurological: Positive for headaches. Negative for dizziness.  Psychiatric/Behavioral: The patient is nervous/anxious.     There were no vitals taken for this visit. Physical Exam  Constitutional: She is oriented to person, place, and time.  HENT:  Head: Atraumatic.  Eyes: EOM are normal.  Neck: Normal range of motion.  Cardiovascular: Normal rate, regular rhythm and normal heart sounds.   No murmur heard. Respiratory: Effort normal and breath sounds normal. She has no wheezes.  GI: Soft. Bowel sounds are normal. She exhibits distension. There is no tenderness.  Musculoskeletal: Normal range of motion.  Neurological: She is alert and oriented to person, place, and time. No cranial nerve deficit. Coordination normal.  Skin: Skin is warm and dry.  Psychiatric: She has a normal mood and affect. Her behavior is normal. Judgment and thought content normal.     Assessment/Plan Cerebral AVM  Worsening headaches Scheduled for embolization today in IR Pt aware of procedure benefits and risks and agreeable to proceed Consent signed and in chart Pt scheduled for OR with Dr Mikal Plane in am She will spend night  in Neuro ICU Wbc 13 today- checking UA  Julia Alkhatib A 04/20/2012, 7:32 AM

## 2012-04-21 ENCOUNTER — Ambulatory Visit (HOSPITAL_COMMUNITY)
Admission: RE | Admit: 2012-04-21 | Payer: Managed Care, Other (non HMO) | Source: Ambulatory Visit | Admitting: Interventional Radiology

## 2012-04-21 ENCOUNTER — Encounter (HOSPITAL_COMMUNITY): Admission: RE | Payer: Self-pay | Source: Ambulatory Visit

## 2012-04-21 SURGERY — CRANIOTOMY INTRACRANIAL ARTERIO-VENOUS MALFORMATION DURAL COMPLEX (AVM)
Anesthesia: General | Laterality: Right

## 2012-04-22 ENCOUNTER — Encounter (HOSPITAL_COMMUNITY): Payer: Self-pay | Admitting: Interventional Radiology

## 2012-04-28 ENCOUNTER — Other Ambulatory Visit: Payer: Self-pay | Admitting: Neurosurgery

## 2012-04-28 ENCOUNTER — Telehealth (HOSPITAL_COMMUNITY): Payer: Self-pay | Admitting: Interventional Radiology

## 2012-04-30 ENCOUNTER — Other Ambulatory Visit: Payer: Self-pay | Admitting: Radiology

## 2012-05-03 ENCOUNTER — Encounter (HOSPITAL_COMMUNITY): Payer: Self-pay | Admitting: Pharmacy Technician

## 2012-05-07 ENCOUNTER — Telehealth (HOSPITAL_COMMUNITY): Payer: Self-pay | Admitting: Interventional Radiology

## 2012-05-10 ENCOUNTER — Encounter (HOSPITAL_COMMUNITY): Admission: RE | Admit: 2012-05-10 | Payer: Managed Care, Other (non HMO) | Source: Ambulatory Visit

## 2012-05-12 ENCOUNTER — Other Ambulatory Visit: Payer: Self-pay | Admitting: Radiology

## 2012-05-13 ENCOUNTER — Ambulatory Visit (HOSPITAL_COMMUNITY): Admission: RE | Admit: 2012-05-13 | Payer: Managed Care, Other (non HMO) | Source: Ambulatory Visit

## 2012-05-21 ENCOUNTER — Other Ambulatory Visit: Payer: Self-pay | Admitting: Radiology

## 2012-05-24 ENCOUNTER — Encounter (HOSPITAL_COMMUNITY)
Admission: RE | Admit: 2012-05-24 | Discharge: 2012-05-24 | Disposition: A | Discharge status: Home / Self Care | Payer: Managed Care, Other (non HMO) | Source: Ambulatory Visit | Attending: Interventional Radiology | Admitting: Interventional Radiology

## 2012-05-24 ENCOUNTER — Encounter (HOSPITAL_COMMUNITY): Payer: Self-pay

## 2012-05-24 LAB — COMPREHENSIVE METABOLIC PANEL
ALT: 12 U/L (ref 0–35)
Alkaline Phosphatase: 114 U/L (ref 39–117)
CO2: 26 mEq/L (ref 19–32)
Calcium: 9.1 mg/dL (ref 8.4–10.5)
Chloride: 102 mEq/L (ref 96–112)
GFR calc Af Amer: >90 mL/min (ref >90)
GFR calc non Af Amer: >90 mL/min (ref >90)
Glucose, Bld: 217 mg/dL — ABNORMAL HIGH (ref 70–99)
Sodium: 138 mEq/L (ref 135–145)
Total Bilirubin: 0.7 mg/dL (ref 0.3–1.2)

## 2012-05-24 LAB — CBC WITH DIFFERENTIAL/PLATELET
Eosinophils Relative: 1 % (ref 0–5)
HCT: 40 % (ref 36.0–46.0)
Lymphocytes Relative: 22 % (ref 12–46)
Lymphs Abs: 2.4 10*3/uL (ref 0.7–4.0)
MCV: 73.8 fL — ABNORMAL LOW (ref 78.0–100.0)
Monocytes Absolute: 0.4 10*3/uL (ref 0.1–1.0)
Platelets: 276 10*3/uL (ref 150–400)
RBC: 5.42 MIL/uL — ABNORMAL HIGH (ref 3.87–5.11)
WBC: 10.7 10*3/uL — ABNORMAL HIGH (ref 4.0–10.5)

## 2012-05-24 LAB — HCG, SERUM, QUALITATIVE: Preg, Serum: NEGATIVE

## 2012-05-24 LAB — ABO/RH: ABO/RH(D): O NEG

## 2012-05-24 NOTE — Pre-Procedure Instructions (Signed)
Olivia Payne  05/24/2012   Your procedure is scheduled on:  May 26, 2012  Report to [facility] at 6:30 AM.  Call this number if you have problems the morning of surgery: [phone]   Remember:   Do not eat food or drink liquids after midnight.   Take these medicines the morning of surgery with A SIP OF WATER: duloxetine(cymbalta), escitalopram(lexapro), pain pill, levothyroxine(synthroid), lorazepam(ativan),    Do not wear jewelry, make-up or nail polish.  Do not wear lotions, powders, or perfumes. You may wear deodorant.  Do not shave 48 hours prior to surgery. Men may shave face and neck.  Do not bring valuables to the hospital.  Contacts, dentures or bridgework may not be worn into surgery.  Leave suitcase in the car. After surgery it may be brought to your room.  For patients admitted to the hospital, checkout time is 11:00 AM the day of  discharge.   Patients discharged the day of surgery will not be allowed to drive  home.  Name and phone number of your driver:   Special Instructions: Shower using CHG 2 nights before surgery and the night before surgery.  If you shower the day of surgery use CHG.  Use special wash - you have one bottle of CHG for all showers.  You should use approximately 1/3 of the bottle for each shower.   Please read over the following fact sheets that you were given: Pain Booklet, Coughing and Deep Breathing, Blood Transfusion Information and Surgical Site Infection Prevention

## 2012-05-25 ENCOUNTER — Other Ambulatory Visit: Payer: Self-pay | Admitting: Radiology

## 2012-05-25 MED ORDER — DEXTROSE 5 % IV SOLN
3.0000 g | INTRAVENOUS | Status: DC
  Filled 2012-05-25: qty 3000

## 2012-05-25 NOTE — Progress Notes (Signed)
Anesthesia chart review: Patient is a 42 year old female scheduled for through arteriogram with right temporal arteriovenous confirmation embolization by Dr. Corliss Skains and right craniotomy for arteriovenous malformation by Dr. Franky Macho on 05/26/12. Other history includes morbid obesity, former smoker, asthma, hypertension, depression, obstructive sleep apnea with CPAP refusal, hypothyroidism, diabetes mellitus type 2, migraines, anxiety. PCP is listed as Dr. Donzetta Sprung.  EKG on 04/19/2012 showed normal sinus rhythm with sinus arrhythmia.  Chest x-ray on 04/19/2012 showed prominent lung markings consistent with chronic lung disease. No acute abnormality and no change from prior study on 03/17/2012.  Preoperative labs noted.  Preoperative testing appears acceptable.  She is morbidly obese with OSA, so she will need close post-operative monitoring.  She will be evaluated by her assigned anesthesiologist on the day of surgery to discuss the definitive anesthesia plan.  Velna Ochs [facility]/Anesthesiology Phone [phone] 05/25/2012 11:04 AM

## 2012-05-26 ENCOUNTER — Inpatient Hospital Stay (HOSPITAL_COMMUNITY): Payer: Managed Care, Other (non HMO)

## 2012-05-26 ENCOUNTER — Encounter (HOSPITAL_COMMUNITY): Payer: Self-pay

## 2012-05-26 ENCOUNTER — Encounter (HOSPITAL_COMMUNITY)
Admission: RE | Disposition: A | Discharge status: Home Health | Payer: Self-pay | Source: Ambulatory Visit | Attending: Neurosurgery

## 2012-05-26 ENCOUNTER — Inpatient Hospital Stay (HOSPITAL_COMMUNITY)
Admission: RE | Admit: 2012-05-26 | Discharge: 2012-06-02 | DRG: 026 | Disposition: A | Discharge status: Home Health | Payer: Managed Care, Other (non HMO) | Source: Ambulatory Visit | Attending: Neurosurgery | Admitting: Neurosurgery

## 2012-05-26 ENCOUNTER — Inpatient Hospital Stay: Admit: 2012-05-26 | Payer: Self-pay | Admitting: Interventional Radiology

## 2012-05-26 ENCOUNTER — Inpatient Hospital Stay (HOSPITAL_COMMUNITY): Payer: Managed Care, Other (non HMO) | Admitting: *Deleted

## 2012-05-26 ENCOUNTER — Encounter (HOSPITAL_COMMUNITY): Payer: Self-pay | Admitting: Vascular Surgery

## 2012-05-26 ENCOUNTER — Ambulatory Visit (HOSPITAL_COMMUNITY)
Admission: RE | Admit: 2012-05-26 | Payer: Managed Care, Other (non HMO) | Source: Ambulatory Visit | Admitting: Interventional Radiology

## 2012-05-26 ENCOUNTER — Inpatient Hospital Stay (HOSPITAL_COMMUNITY): Payer: Managed Care, Other (non HMO) | Admitting: Certified Registered"

## 2012-05-26 ENCOUNTER — Ambulatory Visit (HOSPITAL_COMMUNITY)
Admission: RE | Admit: 2012-05-26 | Discharge: 2012-05-26 | Disposition: A | Discharge status: Home / Self Care | Payer: Managed Care, Other (non HMO) | Source: Ambulatory Visit | Attending: Interventional Radiology | Admitting: Interventional Radiology

## 2012-05-26 ENCOUNTER — Encounter (HOSPITAL_COMMUNITY): Payer: Self-pay | Admitting: *Deleted

## 2012-05-26 DIAGNOSIS — E1139 Type 2 diabetes mellitus with other diabetic ophthalmic complication: Secondary | ICD-10-CM | POA: Diagnosis present

## 2012-05-26 DIAGNOSIS — J95821 Acute postprocedural respiratory failure: Secondary | ICD-10-CM

## 2012-05-26 DIAGNOSIS — G43909 Migraine, unspecified, not intractable, without status migrainosus: Secondary | ICD-10-CM | POA: Diagnosis present

## 2012-05-26 DIAGNOSIS — Q283 Other malformations of cerebral vessels: Principal | ICD-10-CM

## 2012-05-26 DIAGNOSIS — Q282 Arteriovenous malformation of cerebral vessels: Secondary | ICD-10-CM

## 2012-05-26 DIAGNOSIS — F329 Major depressive disorder, single episode, unspecified: Secondary | ICD-10-CM | POA: Diagnosis present

## 2012-05-26 DIAGNOSIS — J45909 Unspecified asthma, uncomplicated: Secondary | ICD-10-CM | POA: Diagnosis present

## 2012-05-26 DIAGNOSIS — E119 Type 2 diabetes mellitus without complications: Secondary | ICD-10-CM | POA: Diagnosis present

## 2012-05-26 DIAGNOSIS — Z79899 Other long term (current) drug therapy: Secondary | ICD-10-CM

## 2012-05-26 DIAGNOSIS — G4730 Sleep apnea, unspecified: Secondary | ICD-10-CM | POA: Diagnosis present

## 2012-05-26 DIAGNOSIS — F411 Generalized anxiety disorder: Secondary | ICD-10-CM | POA: Diagnosis present

## 2012-05-26 DIAGNOSIS — E039 Hypothyroidism, unspecified: Secondary | ICD-10-CM | POA: Diagnosis present

## 2012-05-26 DIAGNOSIS — F3289 Other specified depressive episodes: Secondary | ICD-10-CM | POA: Diagnosis present

## 2012-05-26 DIAGNOSIS — Z6841 Body Mass Index (BMI) 40.0 and over, adult: Secondary | ICD-10-CM

## 2012-05-26 DIAGNOSIS — I10 Essential (primary) hypertension: Secondary | ICD-10-CM | POA: Diagnosis present

## 2012-05-26 HISTORY — PX: RADIOLOGY WITH ANESTHESIA: SHX6223

## 2012-05-26 HISTORY — PX: CRANIOTOMY: SHX93

## 2012-05-26 LAB — CBC WITH DIFFERENTIAL/PLATELET
Eosinophils Absolute: 0.2 10*3/uL (ref 0.0–0.7)
Eosinophils Relative: 2 % (ref 0–5)
HCT: 41.3 % (ref 36.0–46.0)
Hemoglobin: 13.5 g/dL (ref 12.0–15.0)
Lymphocytes Relative: 24 % (ref 12–46)
Lymphs Abs: 3.1 10*3/uL (ref 0.7–4.0)
MCH: 24.6 pg — ABNORMAL LOW (ref 26.0–34.0)
MCV: 75.4 fL — ABNORMAL LOW (ref 78.0–100.0)
Monocytes Absolute: 0.7 10*3/uL (ref 0.1–1.0)
Monocytes Relative: 5 % (ref 3–12)
Platelets: 273 10*3/uL (ref 150–400)
RBC: 5.48 MIL/uL — ABNORMAL HIGH (ref 3.87–5.11)
WBC: 12.7 10*3/uL — ABNORMAL HIGH (ref 4.0–10.5)

## 2012-05-26 LAB — BLOOD GAS, ARTERIAL
Acid-base deficit: 5.8 mmol/L — ABNORMAL HIGH (ref 0.0–2.0)
Bicarbonate: 18.5 mEq/L — ABNORMAL LOW (ref 20.0–24.0)
Drawn by: 283401
FIO2: 0.4 %
MECHVT: 530 mL
O2 Saturation: 95.6 %
RATE: 24 resp/min
TCO2: 19.6 mmol/L (ref 0–100)
TCO2: 20.6 mmol/L (ref 0–100)
pCO2 arterial: 44.2 mmHg (ref 35.0–45.0)
pH, Arterial: 7.261 — ABNORMAL LOW (ref 7.350–7.450)
pO2, Arterial: 80.8 mmHg (ref 80.0–100.0)

## 2012-05-26 LAB — POCT I-STAT 7, (LYTES, BLD GAS, ICA,H+H)
Acid-base deficit: 6 mmol/L — ABNORMAL HIGH (ref 0.0–2.0)
Bicarbonate: 21.1 mEq/L (ref 20.0–24.0)
Hemoglobin: 11.6 g/dL — ABNORMAL LOW (ref 12.0–15.0)
O2 Saturation: 100 %
Potassium: 4.3 mEq/L (ref 3.5–5.1)
Sodium: 136 mEq/L (ref 135–145)
TCO2: 23 mmol/L (ref 0–100)
pH, Arterial: 7.337 — ABNORMAL LOW (ref 7.350–7.450)
pO2, Arterial: 340 mmHg — ABNORMAL HIGH (ref 80.0–100.0)

## 2012-05-26 LAB — POCT I-STAT 4, (NA,K, GLUC, HGB,HCT)
Glucose, Bld: 183 mg/dL — ABNORMAL HIGH (ref 70–99)
HCT: 33 % — ABNORMAL LOW (ref 36.0–46.0)
Hemoglobin: 11.6 g/dL — ABNORMAL LOW (ref 12.0–15.0)
Potassium: 4.4 mEq/L (ref 3.5–5.1)
Sodium: 138 mEq/L (ref 135–145)

## 2012-05-26 LAB — URINALYSIS, ROUTINE W REFLEX MICROSCOPIC
Bilirubin Urine: NEGATIVE
Glucose, UA: NEGATIVE mg/dL
Hgb urine dipstick: NEGATIVE
Specific Gravity, Urine: 1.019 (ref 1.005–1.030)
Urobilinogen, UA: 0.2 mg/dL (ref 0.0–1.0)

## 2012-05-26 LAB — MRSA PCR SCREENING: MRSA by PCR: NEGATIVE

## 2012-05-26 SURGERY — RADIOLOGY WITH ANESTHESIA
Anesthesia: General

## 2012-05-26 SURGERY — CRANIOTOMY INTRACRANIAL ARTERIO-VENOUS MALFORMATION DURAL COMPLEX (AVM)
Anesthesia: General | Laterality: Right | Wound class: Clean

## 2012-05-26 MED ORDER — NITROGLYCERIN IN D5W 200-5 MCG/ML-% IV SOLN
2.0000 ug/min | INTRAVENOUS | Status: DC
  Filled 2012-05-26: qty 250

## 2012-05-26 MED ORDER — DEXAMETHASONE SODIUM PHOSPHATE 4 MG/ML IJ SOLN
4.0000 mg | Freq: Three times a day (TID) | INTRAMUSCULAR | Status: DC
  Filled 2012-05-26 (×2): qty 1

## 2012-05-26 MED ORDER — SODIUM CHLORIDE 0.9 % IV SOLN
500.0000 mg | Freq: Once | INTRAVENOUS | Status: DC
  Filled 2012-05-26: qty 5

## 2012-05-26 MED ORDER — IOHEXOL 300 MG/ML  SOLN
150.0000 mL | Freq: Once | INTRAMUSCULAR | Status: AC | PRN
  Administered 2012-05-26: 120 mL via INTRA_ARTERIAL

## 2012-05-26 MED ORDER — FENTANYL CITRATE 0.05 MG/ML IJ SOLN
INTRAMUSCULAR | Status: DC | PRN
  Administered 2012-05-26: 50 ug via INTRAVENOUS
  Administered 2012-05-26: 150 ug via INTRAVENOUS
  Administered 2012-05-26: 100 ug via INTRAVENOUS
  Administered 2012-05-26 (×2): 50 ug via INTRAVENOUS
  Administered 2012-05-26: 100 ug via INTRAVENOUS
  Administered 2012-05-26 (×4): 50 ug via INTRAVENOUS
  Administered 2012-05-26 (×2): 100 ug via INTRAVENOUS
  Administered 2012-05-26: 50 ug via INTRAVENOUS
  Administered 2012-05-26: 200 ug via INTRAVENOUS
  Administered 2012-05-26: 50 ug via INTRAVENOUS

## 2012-05-26 MED ORDER — SODIUM CHLORIDE 0.9 % IV SOLN
500.0000 mg | Freq: Two times a day (BID) | INTRAVENOUS | Status: AC
  Administered 2012-05-26 – 2012-05-28 (×4): 500 mg via INTRAVENOUS
  Filled 2012-05-26 (×5): qty 5

## 2012-05-26 MED ORDER — HEPARIN SODIUM (PORCINE) 1000 UNIT/ML IJ SOLN
INTRAMUSCULAR | Status: DC | PRN
  Administered 2012-05-26: .5 mL via INTRAVENOUS
  Administered 2012-05-26: 3 mL via INTRAVENOUS

## 2012-05-26 MED ORDER — LACTATED RINGERS IV SOLN
INTRAVENOUS | Status: DC | PRN
  Administered 2012-05-26: 14:00:00 via INTRAVENOUS

## 2012-05-26 MED ORDER — PROPOFOL 10 MG/ML IV EMUL
INTRAVENOUS | Status: AC
  Administered 2012-05-26: 1000 mg
  Filled 2012-05-26: qty 100

## 2012-05-26 MED ORDER — CEFAZOLIN SODIUM-DEXTROSE 2-3 GM-% IV SOLR
2.0000 g | Freq: Three times a day (TID) | INTRAVENOUS | Status: AC
  Administered 2012-05-26 – 2012-05-27 (×2): 2 g via INTRAVENOUS
  Filled 2012-05-26 (×2): qty 50

## 2012-05-26 MED ORDER — SODIUM CHLORIDE 0.9 % IV SOLN
500.0000 mg | INTRAVENOUS | Status: DC | PRN
  Administered 2012-05-26: 500 mg via INTRAVENOUS

## 2012-05-26 MED ORDER — NICARDIPINE HCL IN NACL 20-0.86 MG/200ML-% IV SOLN
5.0000 mg/h | INTRAVENOUS | Status: DC
  Administered 2012-05-26: 13.5 mg/h via INTRAVENOUS
  Administered 2012-05-26: 5 mg/h via INTRAVENOUS
  Administered 2012-05-27 (×3): 13.5 mg/h via INTRAVENOUS
  Filled 2012-05-26 (×5): qty 200

## 2012-05-26 MED ORDER — ACETAMINOPHEN 650 MG RE SUPP: 650.0000 mg | Freq: Four times a day (QID) | RECTAL | Status: DC | PRN

## 2012-05-26 MED ORDER — SODIUM CHLORIDE 0.9 % IV SOLN: Freq: Once | INTRAVENOUS | Status: DC

## 2012-05-26 MED ORDER — CEFAZOLIN SODIUM-DEXTROSE 2-3 GM-% IV SOLR
2.0000 g | Freq: Once | INTRAVENOUS | Status: AC
  Administered 2012-05-26 (×2): 2 g via INTRAVENOUS

## 2012-05-26 MED ORDER — SODIUM CHLORIDE 0.9 % IV SOLN
INTRAVENOUS | Status: DC
  Administered 2012-05-27: 12:00:00 via INTRAVENOUS

## 2012-05-26 MED ORDER — SODIUM CHLORIDE 0.9 % IV SOLN
INTRAVENOUS | Status: DC | PRN
  Administered 2012-05-26 (×2): via INTRAVENOUS

## 2012-05-26 MED ORDER — ONDANSETRON HCL 4 MG PO TABS: 4.0000 mg | ORAL_TABLET | ORAL | Status: DC | PRN

## 2012-05-26 MED ORDER — PROPOFOL 10 MG/ML IV BOLUS
INTRAVENOUS | Status: DC | PRN
  Administered 2012-05-26: 200 mg via INTRAVENOUS
  Administered 2012-05-26: 100 mg via INTRAVENOUS

## 2012-05-26 MED ORDER — BISACODYL 5 MG PO TBEC
5.0000 mg | DELAYED_RELEASE_TABLET | Freq: Every day | ORAL | Status: DC | PRN
  Filled 2012-05-26: qty 1

## 2012-05-26 MED ORDER — MANNITOL 25 % IV SOLN
INTRAVENOUS | Status: DC | PRN
  Administered 2012-05-26: 37.5 g via INTRAVENOUS

## 2012-05-26 MED ORDER — ACETAMINOPHEN 500 MG PO TABS
1000.0000 mg | ORAL_TABLET | Freq: Four times a day (QID) | ORAL | Status: DC | PRN
  Administered 2012-05-31: 1000 mg via ORAL
  Filled 2012-05-26: qty 2

## 2012-05-26 MED ORDER — DEXAMETHASONE SODIUM PHOSPHATE 4 MG/ML IJ SOLN
INTRAMUSCULAR | Status: DC | PRN
  Administered 2012-05-26 (×2): 10 mg via INTRAVENOUS

## 2012-05-26 MED ORDER — ONDANSETRON HCL 4 MG/2ML IJ SOLN: 4.0000 mg | Freq: Once | INTRAMUSCULAR | Status: DC | PRN

## 2012-05-26 MED ORDER — LACTATED RINGERS IV SOLN
INTRAVENOUS | Status: DC | PRN
  Administered 2012-05-26: 09:00:00 via INTRAVENOUS

## 2012-05-26 MED ORDER — MICROFIBRILLAR COLL HEMOSTAT EX PADS
MEDICATED_PAD | CUTANEOUS | Status: DC | PRN
  Administered 2012-05-26: 1 via TOPICAL

## 2012-05-26 MED ORDER — LIDOCAINE-EPINEPHRINE 0.5 %-1:200000 IJ SOLN
INTRAMUSCULAR | Status: DC | PRN
  Administered 2012-05-26: 10 mL

## 2012-05-26 MED ORDER — PROMETHAZINE HCL 25 MG PO TABS: 12.5000 mg | ORAL_TABLET | ORAL | Status: DC | PRN

## 2012-05-26 MED ORDER — DEXAMETHASONE SODIUM PHOSPHATE 4 MG/ML IJ SOLN
4.0000 mg | Freq: Four times a day (QID) | INTRAMUSCULAR | Status: DC
  Administered 2012-05-27 – 2012-05-28 (×2): 4 mg via INTRAVENOUS
  Filled 2012-05-26 (×4): qty 1

## 2012-05-26 MED ORDER — NALOXONE HCL 0.4 MG/ML IJ SOLN: 0.0800 mg | INTRAMUSCULAR | Status: DC | PRN

## 2012-05-26 MED ORDER — ROCURONIUM BROMIDE 100 MG/10ML IV SOLN
INTRAVENOUS | Status: DC | PRN
  Administered 2012-05-26 (×2): 50 mg via INTRAVENOUS

## 2012-05-26 MED ORDER — PROPOFOL 10 MG/ML IV EMUL
INTRAVENOUS | Status: AC
  Administered 2012-05-26: 500 mg
  Filled 2012-05-26: qty 100

## 2012-05-26 MED ORDER — NICARDIPINE HCL IN NACL 20-0.86 MG/200ML-% IV SOLN
5.0000 mg/h | INTRAVENOUS | Status: DC
  Filled 2012-05-26: qty 200

## 2012-05-26 MED ORDER — PANTOPRAZOLE SODIUM 40 MG IV SOLR
40.0000 mg | Freq: Every day | INTRAVENOUS | Status: DC
  Administered 2012-05-27 (×2): 40 mg via INTRAVENOUS
  Filled 2012-05-26 (×3): qty 40

## 2012-05-26 MED ORDER — MAGNESIUM CITRATE PO SOLN
1.0000 | Freq: Once | ORAL | Status: AC | PRN
  Filled 2012-05-26: qty 296

## 2012-05-26 MED ORDER — NIMODIPINE 30 MG PO CAPS
60.0000 mg | ORAL_CAPSULE | ORAL | Status: DC
  Filled 2012-05-26: qty 2

## 2012-05-26 MED ORDER — BACITRACIN ZINC 500 UNIT/GM EX OINT
TOPICAL_OINTMENT | CUTANEOUS | Status: DC | PRN
  Administered 2012-05-26: 1 via TOPICAL

## 2012-05-26 MED ORDER — SUCCINYLCHOLINE CHLORIDE 20 MG/ML IJ SOLN
INTRAMUSCULAR | Status: DC | PRN
  Administered 2012-05-26: 100 mg via INTRAVENOUS

## 2012-05-26 MED ORDER — NITROGLYCERIN 1 MG/10 ML FOR IR/CATH LAB
INTRA_ARTERIAL | Status: AC
  Filled 2012-05-26: qty 10

## 2012-05-26 MED ORDER — MIDAZOLAM HCL 5 MG/5ML IJ SOLN
INTRAMUSCULAR | Status: DC | PRN
  Administered 2012-05-26: 2 mg via INTRAVENOUS

## 2012-05-26 MED ORDER — MANNITOL 25 % IV SOLN
37.5000 g | Freq: Once | INTRAVENOUS | Status: DC
  Filled 2012-05-26: qty 150

## 2012-05-26 MED ORDER — ARTIFICIAL TEARS OP OINT
TOPICAL_OINTMENT | OPHTHALMIC | Status: DC | PRN
  Administered 2012-05-26: 1 via OPHTHALMIC

## 2012-05-26 MED ORDER — SODIUM CHLORIDE 0.9 % IV SOLN
INTRAVENOUS | Status: DC | PRN
  Administered 2012-05-26 (×2): via INTRAVENOUS

## 2012-05-26 MED ORDER — PROTAMINE SULFATE 10 MG/ML IV SOLN
INTRAVENOUS | Status: DC | PRN
  Administered 2012-05-26: 5 mg via INTRAVENOUS

## 2012-05-26 MED ORDER — ROCURONIUM BROMIDE 100 MG/10ML IV SOLN
INTRAVENOUS | Status: DC | PRN
  Administered 2012-05-26: 50 mg via INTRAVENOUS

## 2012-05-26 MED ORDER — CEFAZOLIN SODIUM-DEXTROSE 2-3 GM-% IV SOLR
INTRAVENOUS | Status: AC
  Filled 2012-05-26: qty 50

## 2012-05-26 MED ORDER — SODIUM CHLORIDE 0.9 % IV SOLN
INTRAVENOUS | Status: DC | PRN
  Administered 2012-05-26: 16:00:00 via INTRAVENOUS

## 2012-05-26 MED ORDER — LIDOCAINE HCL (CARDIAC) 20 MG/ML IV SOLN
INTRAVENOUS | Status: DC | PRN
  Administered 2012-05-26: 100 mg via INTRAVENOUS

## 2012-05-26 MED ORDER — DEXTROSE 5 % IV SOLN
3.0000 g | INTRAVENOUS | Status: DC | PRN
  Administered 2012-05-26: 3 g via INTRAVENOUS

## 2012-05-26 MED ORDER — LACTATED RINGERS IV SOLN
INTRAVENOUS | Status: DC | PRN
  Administered 2012-05-26 (×2): via INTRAVENOUS

## 2012-05-26 MED ORDER — ALBUMIN HUMAN 5 % IV SOLN
INTRAVENOUS | Status: DC | PRN
  Administered 2012-05-26 (×2): via INTRAVENOUS

## 2012-05-26 MED ORDER — FENTANYL CITRATE 0.05 MG/ML IJ SOLN: INTRAMUSCULAR | Status: DC | PRN

## 2012-05-26 MED ORDER — HEMOSTATIC AGENTS (NO CHARGE) OPTIME
TOPICAL | Status: DC | PRN
  Administered 2012-05-26: 1 via TOPICAL

## 2012-05-26 MED ORDER — PROPOFOL INFUSION 10 MG/ML OPTIME
INTRAVENOUS | Status: DC | PRN
  Administered 2012-05-26: 100 ug/kg/min via INTRAVENOUS

## 2012-05-26 MED ORDER — ONDANSETRON HCL 4 MG/2ML IJ SOLN: 4.0000 mg | Freq: Four times a day (QID) | INTRAMUSCULAR | Status: DC | PRN

## 2012-05-26 MED ORDER — OXYCODONE HCL 5 MG/5ML PO SOLN: 5.0000 mg | Freq: Once | ORAL | Status: DC | PRN

## 2012-05-26 MED ORDER — POTASSIUM CHLORIDE IN NACL 20-0.9 MEQ/L-% IV SOLN
INTRAVENOUS | Status: DC
  Administered 2012-05-26: 22:00:00 via INTRAVENOUS
  Filled 2012-05-26 (×2): qty 1000

## 2012-05-26 MED ORDER — SENNA 8.6 MG PO TABS
1.0000 | ORAL_TABLET | Freq: Two times a day (BID) | ORAL | Status: DC
  Administered 2012-05-27 – 2012-06-02 (×13): 8.6 mg via ORAL
  Filled 2012-05-26 (×15): qty 1

## 2012-05-26 MED ORDER — MORPHINE SULFATE 2 MG/ML IJ SOLN: 1.0000 mg | INTRAMUSCULAR | Status: DC | PRN

## 2012-05-26 MED ORDER — FENTANYL CITRATE 0.05 MG/ML IJ SOLN
INTRAMUSCULAR | Status: DC | PRN
  Administered 2012-05-26 (×3): 50 ug via INTRAVENOUS
  Administered 2012-05-26: 100 ug via INTRAVENOUS
  Administered 2012-05-26: 50 ug via INTRAVENOUS

## 2012-05-26 MED ORDER — ONDANSETRON HCL 4 MG/2ML IJ SOLN
4.0000 mg | INTRAMUSCULAR | Status: DC | PRN
  Administered 2012-05-31: 4 mg via INTRAVENOUS
  Filled 2012-05-26: qty 2

## 2012-05-26 MED ORDER — ASPIRIN EC 325 MG PO TBEC
325.0000 mg | DELAYED_RELEASE_TABLET | Freq: Once | ORAL | Status: DC
  Filled 2012-05-26: qty 1

## 2012-05-26 MED ORDER — SODIUM CHLORIDE 0.9 % IV SOLN
10.0000 mg | INTRAVENOUS | Status: DC | PRN
  Administered 2012-05-26: 20 ug/min via INTRAVENOUS

## 2012-05-26 MED ORDER — 0.9 % SODIUM CHLORIDE (POUR BTL) OPTIME
TOPICAL | Status: DC | PRN
  Administered 2012-05-26 (×2): 1000 mL

## 2012-05-26 MED ORDER — EPHEDRINE SULFATE 50 MG/ML IJ SOLN
INTRAMUSCULAR | Status: DC | PRN
  Administered 2012-05-26 (×2): 5 mg via INTRAVENOUS

## 2012-05-26 MED ORDER — VECURONIUM BROMIDE 10 MG IV SOLR
INTRAVENOUS | Status: DC | PRN
  Administered 2012-05-26: 5 mg via INTRAVENOUS
  Administered 2012-05-26: 10 mg via INTRAVENOUS
  Administered 2012-05-26 (×2): 5 mg via INTRAVENOUS

## 2012-05-26 MED ORDER — OXYCODONE HCL 5 MG PO TABS: 5.0000 mg | ORAL_TABLET | Freq: Once | ORAL | Status: DC | PRN

## 2012-05-26 MED ORDER — THROMBIN 20000 UNITS EX SOLR
CUTANEOUS | Status: DC | PRN
  Administered 2012-05-26: 15:00:00 via TOPICAL

## 2012-05-26 MED ORDER — LABETALOL HCL 5 MG/ML IV SOLN: 10.0000 mg | INTRAVENOUS | Status: DC | PRN

## 2012-05-26 MED ORDER — DEXAMETHASONE SODIUM PHOSPHATE 10 MG/ML IJ SOLN
6.0000 mg | Freq: Four times a day (QID) | INTRAMUSCULAR | Status: AC
  Administered 2012-05-27 (×4): 6 mg via INTRAVENOUS
  Filled 2012-05-26: qty 0.6
  Filled 2012-05-26: qty 1
  Filled 2012-05-26 (×3): qty 0.6

## 2012-05-26 MED ORDER — HYDROMORPHONE HCL PF 1 MG/ML IJ SOLN: 0.2500 mg | INTRAMUSCULAR | Status: DC | PRN

## 2012-05-26 MED ORDER — SENNOSIDES-DOCUSATE SODIUM 8.6-50 MG PO TABS
1.0000 | ORAL_TABLET | Freq: Every evening | ORAL | Status: DC | PRN
  Filled 2012-05-26 (×3): qty 1

## 2012-05-26 SURGICAL SUPPLY — 110 items
Aneurysm clip mini straight 5mm ×2 IMPLANT
BANDAGE GAUZE ELAST BULKY 4 IN (GAUZE/BANDAGES/DRESSINGS) IMPLANT
BENZOIN TINCTURE PRP APPL 2/3 (GAUZE/BANDAGES/DRESSINGS) ×2 IMPLANT
BIT DRILL WIRE PASS 1.3MM (BIT) IMPLANT
BLADE EYE SICKLE 84 5 BEAV (BLADE) ×2 IMPLANT
BLADE SAW GIGLI 16 STRL (MISCELLANEOUS) IMPLANT
BLADE SURG 10 STRL SS (BLADE) ×2 IMPLANT
BLADE ULTRA TIP 2M (BLADE) IMPLANT
BRUSH SCRUB EZ 1% IODOPHOR (MISCELLANEOUS) IMPLANT
BRUSH SCRUB EZ PLAIN DRY (MISCELLANEOUS) IMPLANT
BUR ACORN 6.0 PRECISION (BURR) ×2 IMPLANT
BUR ADDG 1.1 (BURR) IMPLANT
BUR MATCHSTICK NEURO 3.0 LAGG (BURR) IMPLANT
BUR ROUTER D-58 CRANI (BURR) ×2 IMPLANT
CANISTER SUCTION 2500CC (MISCELLANEOUS) ×2 IMPLANT
CLIP ANEURY TI TEMP MINI STR 7 (Clip) ×2 IMPLANT
CLIP TI MEDIUM 6 (CLIP) IMPLANT
CLOTH BEACON ORANGE TIMEOUT ST (SAFETY) ×2 IMPLANT
CONT SPEC 4OZ CLIKSEAL STRL BL (MISCELLANEOUS) ×4 IMPLANT
CORDS BIPOLAR (ELECTRODE) ×4 IMPLANT
DECANTER SPIKE VIAL GLASS SM (MISCELLANEOUS) ×2 IMPLANT
DERMABOND ADHESIVE PROPEN (GAUZE/BANDAGES/DRESSINGS) ×1
DERMABOND ADVANCED .7 DNX6 (GAUZE/BANDAGES/DRESSINGS) ×1 IMPLANT
DRAIN SNY WOU 7FLT (WOUND CARE) IMPLANT
DRAPE LAPAROTOMY 100X72X124 (DRAPES) ×2 IMPLANT
DRAPE MICROSCOPE LEICA (MISCELLANEOUS) ×2 IMPLANT
DRAPE NEUROLOGICAL W/INCISE (DRAPES) ×2 IMPLANT
DRAPE WARM FLUID 44X44 (DRAPE) ×2 IMPLANT
DRESSING TELFA 8X3 (GAUZE/BANDAGES/DRESSINGS) ×4 IMPLANT
DRILL WIRE PASS 1.3MM (BIT)
DURAGUARD 06CMX08CM ×2 IMPLANT
DURAPREP 6ML APPLICATOR 50/CS (WOUND CARE) ×2 IMPLANT
ELECT CAUTERY BLADE 6.4 (BLADE) ×4 IMPLANT
ELECT REM PT RETURN 9FT ADLT (ELECTROSURGICAL) ×2
ELECTRODE REM PT RTRN 9FT ADLT (ELECTROSURGICAL) ×1 IMPLANT
EVACUATOR SILICONE 100CC (DRAIN) IMPLANT
FORCEPS BIPOLAR SPETZLER 8 1.0 (NEUROSURGERY SUPPLIES) ×2 IMPLANT
GAUZE SPONGE 4X4 16PLY XRAY LF (GAUZE/BANDAGES/DRESSINGS) IMPLANT
GLOVE BIO SURGEON STRL SZ 6.5 (GLOVE) IMPLANT
GLOVE BIO SURGEON STRL SZ7 (GLOVE) IMPLANT
GLOVE BIO SURGEON STRL SZ7.5 (GLOVE) IMPLANT
GLOVE BIO SURGEON STRL SZ8 (GLOVE) IMPLANT
GLOVE BIO SURGEON STRL SZ8.5 (GLOVE) IMPLANT
GLOVE BIOGEL M 8.0 STRL (GLOVE) IMPLANT
GLOVE BIOGEL PI IND STRL 6.5 (GLOVE) ×1 IMPLANT
GLOVE BIOGEL PI IND STRL 7.5 (GLOVE) ×2 IMPLANT
GLOVE BIOGEL PI IND STRL 8.5 (GLOVE) ×3 IMPLANT
GLOVE BIOGEL PI INDICATOR 6.5 (GLOVE) ×1
GLOVE BIOGEL PI INDICATOR 7.5 (GLOVE) ×2
GLOVE BIOGEL PI INDICATOR 8.5 (GLOVE) ×3
GLOVE ECLIPSE 6.5 STRL STRAW (GLOVE) IMPLANT
GLOVE ECLIPSE 7.0 STRL STRAW (GLOVE) IMPLANT
GLOVE ECLIPSE 7.5 STRL STRAW (GLOVE) IMPLANT
GLOVE ECLIPSE 8.0 STRL XLNG CF (GLOVE) IMPLANT
GLOVE ECLIPSE 8.5 STRL (GLOVE) IMPLANT
GLOVE EXAM NITRILE LRG STRL (GLOVE) IMPLANT
GLOVE EXAM NITRILE MD LF STRL (GLOVE) IMPLANT
GLOVE EXAM NITRILE XL STR (GLOVE) IMPLANT
GLOVE EXAM NITRILE XS STR PU (GLOVE) IMPLANT
GLOVE INDICATOR 6.5 STRL GRN (GLOVE) IMPLANT
GLOVE INDICATOR 7.0 STRL GRN (GLOVE) IMPLANT
GLOVE INDICATOR 7.5 STRL GRN (GLOVE) IMPLANT
GLOVE INDICATOR 8.0 STRL GRN (GLOVE) IMPLANT
GLOVE INDICATOR 8.5 STRL (GLOVE) IMPLANT
GLOVE OPTIFIT SS 8.0 STRL (GLOVE) IMPLANT
GLOVE SURG SS PI 6.5 STRL IVOR (GLOVE) ×2 IMPLANT
GLOVE SURG SS PI 7.5 STRL IVOR (GLOVE) ×6 IMPLANT
GLOVE SURG SS PI 8.0 STRL IVOR (GLOVE) ×4 IMPLANT
GLOVE SURG SS PI 8.5 STRL IVOR (GLOVE) ×3
GLOVE SURG SS PI 8.5 STRL STRW (GLOVE) ×3 IMPLANT
GOWN BRE IMP SLV AUR LG STRL (GOWN DISPOSABLE) ×8 IMPLANT
GOWN BRE IMP SLV AUR XL STRL (GOWN DISPOSABLE) ×4 IMPLANT
GOWN STRL REIN 2XL LVL4 (GOWN DISPOSABLE) ×8 IMPLANT
HEMOSTAT SURGICEL 2X14 (HEMOSTASIS) ×2 IMPLANT
HOOK DURA (MISCELLANEOUS) ×2 IMPLANT
KIT BASIN OR (CUSTOM PROCEDURE TRAY) ×2 IMPLANT
KIT DRAIN CSF ACCUDRAIN (MISCELLANEOUS) IMPLANT
KIT ROOM TURNOVER OR (KITS) ×2 IMPLANT
KNIFE ARACHNOID DISP AM-21-S (BLADE) ×2 IMPLANT
MARKER SPHERE PSV REFLC NDI (MISCELLANEOUS) ×8 IMPLANT
NEEDLE HYPO 25X1 1.5 SAFETY (NEEDLE) ×2 IMPLANT
NS IRRIG 1000ML POUR BTL (IV SOLUTION) ×2 IMPLANT
PACK CRANIOTOMY (CUSTOM PROCEDURE TRAY) ×2 IMPLANT
PAD ARMBOARD 7.5X6 YLW CONV (MISCELLANEOUS) ×12 IMPLANT
PATTIES SURGICAL .25X.25 (GAUZE/BANDAGES/DRESSINGS) IMPLANT
PATTIES SURGICAL .5 X.5 (GAUZE/BANDAGES/DRESSINGS) ×2 IMPLANT
PATTIES SURGICAL .5 X1 (DISPOSABLE) ×4 IMPLANT
PATTIES SURGICAL .5 X3 (DISPOSABLE) IMPLANT
PATTIES SURGICAL 1/4 X 3 (GAUZE/BANDAGES/DRESSINGS) IMPLANT
PATTIES SURGICAL 1X1 (DISPOSABLE) IMPLANT
PENCIL BUTTON HOLSTER BLD 10FT (ELECTRODE) ×2 IMPLANT
PIN MAYFIELD SKULL DISP (PIN) ×2 IMPLANT
RUBBERBAND STERILE (MISCELLANEOUS) ×8 IMPLANT
SPONGE GAUZE 4X4 12PLY (GAUZE/BANDAGES/DRESSINGS) IMPLANT
SPONGE NEURO XRAY DETECT 1X3 (DISPOSABLE) IMPLANT
SPONGE SURGIFOAM ABS GEL 100 (HEMOSTASIS) ×2 IMPLANT
SPONGE SURGIFOAM ABS GEL 100C (HEMOSTASIS) ×2 IMPLANT
STAPLER SKIN PROX WIDE 3.9 (STAPLE) ×2 IMPLANT
SUT ETHILON 3 0 FSL (SUTURE) IMPLANT
SUT NURALON 4 0 TR CR/8 (SUTURE) ×4 IMPLANT
SUT VIC AB 2-0 CT2 18 VCP726D (SUTURE) ×8 IMPLANT
SUT VIC AB 3-0 SH 8-18 (SUTURE) IMPLANT
SYR 20ML ECCENTRIC (SYRINGE) ×2 IMPLANT
SYR CONTROL 10ML LL (SYRINGE) ×2 IMPLANT
TAPE CLOTH SURG 4X10 WHT LF (GAUZE/BANDAGES/DRESSINGS) ×2 IMPLANT
TOWEL OR 17X24 6PK STRL BLUE (TOWEL DISPOSABLE) ×4 IMPLANT
TOWEL OR 17X26 10 PK STRL BLUE (TOWEL DISPOSABLE) ×4 IMPLANT
TRAY FOLEY CATH 14FRSI W/METER (CATHETERS) IMPLANT
UNDERPAD 30X30 INCONTINENT (UNDERPADS AND DIAPERS) IMPLANT
WATER STERILE IRR 1000ML POUR (IV SOLUTION) ×2 IMPLANT

## 2012-05-26 NOTE — Progress Notes (Signed)
Pt given nimodipine 60 mg po and aspirin 325 mg po per orders. Patient's armband scanned and message came up stating that it didn;t match current patient information.  Both armbands, one for radiology procedure and surgical procedure scanned and same message appeared. Patient information and identification verified with orders and all matches. So medication given and 2 link called and pharmacy notified.

## 2012-05-26 NOTE — Anesthesia Postprocedure Evaluation (Signed)
  Patient transferred to OR 31 intubated and sedated on full monitors.  New anesthesia record started in the OR.

## 2012-05-26 NOTE — H&P (Signed)
Olivia Payne is an 42 y.o. female.   Chief Complaint: Cerebral arteriogram 04/02/12 reveals  Rt temporal ArterioVenous Malformation Worsening headaches Pt is scheduled for AVM embolization today in IR with Dr Corliss Skains Probable surgery with Dr Franky Macho tomorrow HPI: Migraines; HTN; DM; sleep apnea  Past Medical History  Diagnosis Date  . Asthma   . History of chicken pox   . Migraine   . Allergy   . Thyroid disease   . Neuropathy   . Hypertension   . Hypothyroidism   . Anxiety   . Depression   . Cerebral aneurysm   . Cerebral AVM   . Sleep apnea     does not use Cpap  . Diabetes mellitus without complication     borderline    Past Surgical History  Procedure Laterality Date  . Dilation and curettage of uterus  06/07/2002  . Eye surgery  01/12/2013  . Cesarean section  1995  . Cerebral arteriogram    . Radiology with anesthesia N/A 04/20/2012    Procedure: RADIOLOGY WITH ANESTHESIA;  Surgeon: Oneal Grout, MD;  Location: [facility];  Service: Radiology;  Laterality: N/A;    Family History  Problem Relation Age of Onset  . Cancer Mother     Breast Cancer  . Diabetes Father   . Heart disease Father   . Heart attack Father 66  . Cancer Paternal Aunt     Ovarian Cancer  . Diabetes Paternal Grandmother   . Cancer Other     Lung Cancer-Maternal side of family   Social History:  reports that she has quit smoking. She does not have any smokeless tobacco history on file. She reports that she does not drink alcohol or use illicit drugs.  Allergies:  Allergies  Allergen Reactions  . Latex Other (See Comments)    "SEVERE REACTION"     (Not in a hospital admission)  Results for orders placed during the hospital encounter of 05/26/12 (from the past 48 hour(s))  SURGICAL PCR SCREEN     Status: None   Collection Time    05/24/12 11:07 AM      Result Value Range   MRSA, PCR NEGATIVE  NEGATIVE   Staphylococcus aureus NEGATIVE  NEGATIVE   Comment:          The Xpert SA Assay (FDA     approved for NASAL specimens     in patients over 63 years of age),     is one component of     a comprehensive surveillance     program.  Test performance has     been validated by [employer] for patients greater     than or equal to 70 year old.     It is not intended     to diagnose infection nor to     guide or monitor treatment.  TYPE AND SCREEN     Status: None   Collection Time    05/24/12 11:11 AM      Result Value Range   ABO/RH(D) O NEG     Antibody Screen NEG     Sample Expiration 06/07/2012    ABO/RH     Status: None   Collection Time    05/24/12 11:11 AM      Result Value Range   ABO/RH(D) O NEG    APTT     Status: None   Collection Time    05/24/12 11:30 AM  Result Value Range   aPTT 33  24 - 37 seconds  CBC WITH DIFFERENTIAL     Status: Abnormal   Collection Time    05/24/12 11:30 AM      Result Value Range   WBC 10.7 (*) 4.0 - 10.5 K/uL   RBC 5.42 (*) 3.87 - 5.11 MIL/uL   Hemoglobin 13.4  12.0 - 15.0 g/dL   HCT 16.1  09.6 - 04.5 %   MCV 73.8 (*) 78.0 - 100.0 fL   MCH 24.7 (*) 26.0 - 34.0 pg   MCHC 33.5  30.0 - 36.0 g/dL   RDW 40.9 (*) 81.1 - 91.4 %   Platelets 276  150 - 400 K/uL   Neutrophils Relative 73  43 - 77 %   Neutro Abs 7.8 (*) 1.7 - 7.7 K/uL   Lymphocytes Relative 22  12 - 46 %   Lymphs Abs 2.4  0.7 - 4.0 K/uL   Monocytes Relative 4  3 - 12 %   Monocytes Absolute 0.4  0.1 - 1.0 K/uL   Eosinophils Relative 1  0 - 5 %   Eosinophils Absolute 0.1  0.0 - 0.7 K/uL   Basophils Relative 0  0 - 1 %   Basophils Absolute 0.0  0.0 - 0.1 K/uL  COMPREHENSIVE METABOLIC PANEL     Status: Abnormal   Collection Time    05/24/12 11:30 AM      Result Value Range   Sodium 138  135 - 145 mEq/L   Potassium 4.3  3.5 - 5.1 mEq/L   Chloride 102  96 - 112 mEq/L   CO2 26  19 - 32 mEq/L   Glucose, Bld 217 (*) 70 - 99 mg/dL   BUN 8  6 - 23 mg/dL   Creatinine, Ser 7.82  0.50 - 1.10 mg/dL   Calcium 9.1  8.4 - 95.6 mg/dL    Total Protein 6.9  6.0 - 8.3 g/dL   Albumin 3.1 (*) 3.5 - 5.2 g/dL   AST 12  0 - 37 U/L   ALT 12  0 - 35 U/L   Alkaline Phosphatase 114  39 - 117 U/L   Total Bilirubin 0.7  0.3 - 1.2 mg/dL   GFR calc non Af Amer >90  >90 mL/min   GFR calc Af Amer >90  >90 mL/min   Comment:            The eGFR has been calculated     using the CKD EPI equation.     This calculation has not been     validated in all clinical     situations.     eGFR's persistently     <90 mL/min signify     possible Chronic Kidney Disease.  PROTIME-INR     Status: None   Collection Time    05/24/12 11:30 AM      Result Value Range   Prothrombin Time 13.5  11.6 - 15.2 seconds   INR 1.04  0.00 - 1.49  HCG, SERUM, QUALITATIVE     Status: None   Collection Time    05/24/12 11:30 AM      Result Value Range   Preg, Serum NEGATIVE  NEGATIVE   Comment:            THE SENSITIVITY OF THIS     METHODOLOGY IS >10 mIU/mL.  GLUCOSE, CAPILLARY     Status: Abnormal   Collection Time    05/26/12  6:49 AM  Result Value Range   Glucose-Capillary 178 (*) 70 - 99 mg/dL   No results found.  Review of Systems  Constitutional: Negative for fever and chills.  Eyes: Positive for blurred vision.       Occasional blurry vision  Respiratory: Negative for cough, shortness of breath and wheezing.   Cardiovascular: Negative for chest pain.  Gastrointestinal: Negative for nausea, vomiting and abdominal pain.  Musculoskeletal: Negative for back pain.  Neurological: Positive for headaches.    Blood pressure 128/79, pulse 95, temperature 97.7 F (36.5 C), temperature source Oral, resp. rate 20, SpO2 98.00%. Physical Exam  Constitutional: She is oriented to person, place, and time. She appears well-developed.  Cardiovascular: Normal rate, regular rhythm and normal heart sounds.   No murmur heard. Respiratory: Effort normal and breath sounds normal. She has no wheezes.  GI: Soft. Bowel sounds are normal. She exhibits distension.  There is no tenderness.  Musculoskeletal: Normal range of motion.  Neurological: She is alert and oriented to person, place, and time.  Skin: Skin is warm and dry.  Psychiatric: She has a normal mood and affect. Her behavior is normal. Judgment and thought content normal.     Assessment/Plan Pt scheduled today for Rt temporal arteriovenous malformation embolization today in IR with Dr Corliss Skains Pt and family aware of procedure benefits and risks and agreeable to proceed Scheduled for surgery with Dr Franky Macho tomorrow Consent signed and in chart  Yosef Krogh A 05/26/2012, 8:34 AM

## 2012-05-26 NOTE — H&P (Signed)
There were no vitals taken for this visit. Olivia Payne comes in today.  She is a known patient of Dr. Thurmon Fair, but comes today for referral of treatment of an arterial venous malformation.  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  When she continued to have persistent headaches on the left side, an MRI was ordered and what the MRI revealed was a posterotemporal arterial venous malformation.  She was sent to me for further evaluation.  Olivia Payne has headaches. She has some problems with vision in the left eye and there is a direct result of the aneurysm rupturing. She needed to have surgery in the left eye to clean out and remove the blood, but she has in all honesty nothing which was referable to the AVM.  She does not have seizures nor has she had any.  She feels that her memory is bad and that her speech is poor, but she did not notice this really and truly until she found out about the AVM.  She has never had a severe headache in the past which led her to go to the hospital. She had never had her brain evaluated or imaged in any other way.    She says that working is what caused her to have the headache. She says that she will take two Excedrin for the migraine or that Motrin will sometimes help.  She has been taking 800 to 3200 mg. of Motrin since November 7th, 2003 for neck pain when she was told she had a headache and that it would stop it.  She is 42 years of age, right-handed, and she works in the [employer].  She says after the eye hemorrhage she was unable to work because she could not remember what she did and she had been working there for 15 years.  She is dizzy and will lose her balance. She feels that her speech is not normal.  She says that she has numbness and tingling in her feet at times.    PAST MEDICAL HISTORY:  Includes hypertension, diabetes, blindness, arterial sclerosis.  No known drug allergies.  Medications are  Ativan, Lisinopril, Lexapro, Cymbalta, Metformin, Lyrica, Levothyroxine, and Topamax.  She says most of these medications were started this summer after she had the hemorrhage in the eye.    FAMILY HISTORY:    Her mother age 28 is in good health.  Her father died at 51 years of age.  He had diabetes, hypertension, and neurological problems.    SOCIAL HISTORY:    She does not smoke.  She does not use alcohol.  She is 332 lbs. and is 166 cm. in height.    REVIEW OF SYSTEMS:   Positive for low grade fever, night sweats, eye injuries, tinnitus, balance problems, nasal congestion, sinus problems, sinus headaches, sore throat, chest pain, hypertension, swelling in the feet and hands, asthma, abdominal pain, back pain, breast pain, breast tenderness, fainting spells, memory problems, disorientation, difficulty with speech, difficulty concentrating, blurred vision, pins and needle feeling in the face, anxiety, depression, diabetes, thyroid disease, excessive thirst or urination, anemia, bleeding problems. She says she bruises easily and inhalant allergies.    PHYSICAL EXAMINATION:  She is alert, oriented x 4, and answers all questions appropriately.  Memory, language, attention span, and fund of knowledge are normal. She is well kempt and in some distress.  The pupils are equal, round, and react to light.  Full extraocular  movements.  She does not have full visual fields.  She has symmetric facial movements and  sensation.  Hearing intact to finger rub bilaterally. Uvula elevates in the midline.  Shoulder shrug is normal. Tongue protrudes in the midline.  No drift on examination.  5/5 strength in the upper and lower extremities.  Normal reflexes - 2+ in the upper extremities, 2+ at the knees, not elicited at the ankles.  Romberg test is negative. Gait is otherwise normal.    DIAGNOSTIC STUDIES:   MRI of the brain is reviewed.  It shows a posterior well defined approximately 3 x 2 cm. lesion which is  characteristic for an arterial venous malformation.  Absolutely no evidence of surrounding hemorrhage.  There is no surrounding edema.  No real mass effect.    SUMMARY:      For 25 minutes we discussed the AVM, it's epidemiology, what I believe the risks of having it are, possible hemorrhage, possible seizures.  I explained that hemorrhages from AVMs are rarely fatal because they are not at full arterial pressure. I explained to her that I do believe this should be treated and I think surgery is the best option,Olivia Payne returns after undergoing a cerebral angiogram to further characterize the right posterotemporal AVM.  She does have what appears to be a proximal aneurysm on the vasculature, but it is not in close proximity to the AVM.  The AVM seems to be very well contained and is compact.  There is some deep drainage, but appears to be only one vessel.    I explained the options to Olivia Payne including radiosurgery for the lesion or surgery.  She would like to proceed with surgery.  Risks of the procedure of bleeding, infection, stroke, coma, death, weakness, paralysis, brain damage, problems with speech, difficulty with her visual fields.    I explained how the craniotomy would be performed. I explained that she would be admitted the day before so she could undergo cerebral embolization of the aneurysm if it is at all possible, and we would do the craniotomy.  She is also to undergo a Stealth CT on the day that she is admitted.  I would use that for the operation.  She understands and wishes to proceed.

## 2012-05-26 NOTE — Anesthesia Preprocedure Evaluation (Signed)
Anesthesia Evaluation  Patient identified by MRN, date of birth, ID band Patient awake    Reviewed: Allergy & Precautions, H&P , NPO status , Patient's Chart, lab work & pertinent test results  Airway Mallampati: I TM Distance: >3 FB Neck ROM: Full    Dental   Pulmonary asthma , sleep apnea ,          Cardiovascular hypertension, Pt. on medications     Neuro/Psych    GI/Hepatic   Endo/Other  diabetes  Renal/GU      Musculoskeletal   Abdominal   Peds  Hematology   Anesthesia Other Findings   Reproductive/Obstetrics                           Anesthesia Physical Anesthesia Plan  ASA: III  Anesthesia Plan: General   Post-op Pain Management:    Induction: Intravenous  Airway Management Planned: Oral ETT  Additional Equipment:   Intra-op Plan:   Post-operative Plan: Possible Post-op intubation/ventilation  Informed Consent: I have reviewed the patients History and Physical, chart, labs and discussed the procedure including the risks, benefits and alternatives for the proposed anesthesia with the patient or authorized representative who has indicated his/her understanding and acceptance.     Plan Discussed with: CRNA and Surgeon  Anesthesia Plan Comments:         Anesthesia Quick Evaluation

## 2012-05-26 NOTE — Anesthesia Preprocedure Evaluation (Addendum)
Anesthesia Evaluation  Patient identified by MRN, date of birth, ID band Patient awake    Reviewed: Allergy & Precautions, H&P , NPO status , Patient's Chart, lab work & pertinent test results  Airway Mallampati: II TM Distance: >3 FB Neck ROM: Full    Dental  (+) Teeth Intact and Dental Advisory Given   Pulmonary sleep apnea ,  breath sounds clear to auscultation        Cardiovascular hypertension, Pt. on medications Rhythm:Regular     Neuro/Psych    GI/Hepatic   Endo/Other  diabetes, Well Controlled, Type 2, Oral Hypoglycemic AgentsMorbid obesity  Renal/GU      Musculoskeletal   Abdominal   Peds  Hematology   Anesthesia Other Findings   Reproductive/Obstetrics                          Anesthesia Physical Anesthesia Plan  ASA: III  Anesthesia Plan: General   Post-op Pain Management:    Induction: Intravenous  Airway Management Planned: Oral ETT  Additional Equipment: CVP and Arterial line  Intra-op Plan:   Post-operative Plan: Possible Post-op intubation/ventilation  Informed Consent: I have reviewed the patients History and Physical, chart, labs and discussed the procedure including the risks, benefits and alternatives for the proposed anesthesia with the patient or authorized representative who has indicated his/her understanding and acceptance.   Dental advisory given  Plan Discussed with: CRNA, Anesthesiologist and Surgeon  Anesthesia Plan Comments:         Anesthesia Quick Evaluation

## 2012-05-26 NOTE — Anesthesia Procedure Notes (Signed)
Procedure Name: Intubation Date/Time: 05/26/2012 10:16 AM Performed by: Jerilee Hoh Pre-anesthesia Checklist: Patient identified, Emergency Drugs available, Suction available, Timeout performed and Patient being monitored Patient Re-evaluated:Patient Re-evaluated prior to inductionOxygen Delivery Method: Circle system utilized Preoxygenation: Pre-oxygenation with 100% oxygen Intubation Type: IV induction Ventilation: Mask ventilation without difficulty and Oral airway inserted - appropriate to patient size Laryngoscope Size: Mac and 3 Grade View: Grade I Tube type: Subglottic suction tube Tube size: 7.0 mm Number of attempts: 1 Airway Equipment and Method: Stylet Placement Confirmation: ETT inserted through vocal cords under direct vision,  positive ETCO2,  CO2 detector and breath sounds checked- equal and bilateral Secured at: 22 cm Tube secured with: Tape Dental Injury: Teeth and Oropharynx as per pre-operative assessment

## 2012-05-26 NOTE — Preoperative (Signed)
Beta Blockers   Reason not to administer Beta Blockers:Not Applicable 

## 2012-05-26 NOTE — Transfer of Care (Signed)
   Patient from IR transferred to CT, then OR 31 intubated and sedated on full monitors. New anesthesia record started in the OR.  Report given to Linde Gillis, CRNA.

## 2012-05-26 NOTE — Transfer of Care (Signed)
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 PLACEMENT OF BONE FLAB IN ABDOMINAL WALL (Right) - RIGHT Craniectomy for arteriovenous malformation resection  Patient Location: NICU  Anesthesia Type:General  Level of Consciousness: Patient remains intubated per anesthesia plan  Airway & Oxygen Therapy: Patient remains intubated per anesthesia plan and Patient placed on Ventilator (see vital sign flow sheet for setting)  Post-op Assessment: Report given to PACU RN and Post -op Vital signs reviewed and stable  Post vital signs: Reviewed and stable  Complications: No apparent anesthesia complications

## 2012-05-26 NOTE — OR Nursing (Signed)
Craniectomy end time 2025. Placement of Bone flap in abdominal wall incision 2040.

## 2012-05-26 NOTE — Op Note (Signed)
05/26/2012  9:17 PM  PATIENT:  Olivia Payne  42 y.o. female whom presented with an AVM in the posterior right temporal lobe. I recommended and she agreed to undergo a craniotomy to resect the mass, after cerebral embolization  PRE-OPERATIVE DIAGNOSIS:  arteriovenous malformation right posterior temporal lobe  POST-OPERATIVE DIAGNOSIS:  arteriovenous malformation right posterior temporal lobe  PROCEDURE:  Procedure(s):  Frameless Stereotactic CRANIECTOMY for Right posterior temporal AVM resection . PLACEMENT OF BONE FLAB IN ABDOMINAL WALL, right  Microdissection   SURGEON:  Surgeon(s): Carmela Hurt, MD Barnett Abu, MD  ASSISTANTS:Elsner, Sherilyn Cooter  ANESTHESIA:   general  EBL:  Total I/O In: 2250 [I.V.:2000; IV Piggyback:250] Out: 400 [Urine:200; Blood:200]  BLOOD ADMINISTERED:none  CELL SAVER GIVEN:none  COUNT:per nursing  DRAINS: none   SPECIMEN:  Source of Specimen:  temporal lobe  DICTATION: Olivia Payne was brought directly from the CT, and IR suite where she underwent preoperative embolization of the AVM. Preoperative planning on the stealth station was performed. She was moved from the stretcher under a general anesthetic(she was intubated for the embolization and kept under a general anesthetic) and positioned supine on the operating table. A roll was placed beneath the right shoulder to facilitate rotating her head towards the left to keep the temporal lobe parallel to the floor. She was placed in a 3 pin [place] head holder at ~70lbs of pressure. The stealth frame was secured to the [facility] and Olivia Payne was registered with the the stealth system. I used the stealth system to plan my craniotomy flap, and then outlined my scalp flap. The head was shaved, prepped, and subsequently draped in a sterile manner. I infiltrated 10cc l/2%lidocaine-1/200,000 epineprhine into the scalp in the planned line of incision. I opened the scalp with a 10 blade, then divided the temporalis  muscle with monopolar cautery. I placed Raney clips on the scalp edges, and reflected the horseshoe shaped flap laterally. I drilled 4 holes in the skull to make a rectangular opening in the skull. I used the stealth probe to ensure placement of the flap. I used the craniotome to complete the craniotomy and removed the skull flap. The dura was partially opened along the inferior margin of the craniotomy so I continued with that and based the dural flap posteriorly exposing the AVM quite well. The AVM was on the brain surface and immediately seen after reflecting the dura.  I started my resection of the AVM with microscopic dissection, bringing the microscope into the operative field. Over the next three hours I dissected around the posterior margin first since there was a complete emobolization there and worked my way forward. The superficial drainage was overlying the most anterior portion of the avm and thus I left that dissection until the end. Dr. Danielle Dess and I used bipolar cautery, traction, microdissection, and scissors to work slowly and thoroughly around the AVM taking care to do as little manipulation of the surrounding brain. The major superior pedicle was identified and I used an aneurysm clip which was permanent on the proximal end, and a temporary clip distally, to allow me to cauterize the vessel between the two clips. I then divided the artery sharply and continued to work anterior and underneath this portion of the AVM. We were able to devascularize the avm over its entirety using cautery and sharp dissection. At that time with the AVM devascularized I cauterized the draining vein, divided it sharply, and Dr. Danielle Dess delivered the AVM from the brain. I obtained hemostasis  in the resection bed, with microdissecition and cautery. The brain however was quite proud in the craniotomy flap, though very pulsatile. We made the decision to leave the bone flap out to allow for swelling of the temporal lobe.  This was not hyperperfusion swelling as I noted the brain being quite tense before any resection of the AVM. The brain was more relaxed after the AVM was removed, but not enough to allow for safe placement of the flap. We then irrigated and closed the scalp. I placed a piece of bovine dural substitute to cover the brain, then approximated the temporalis muscle and fascia, subgaleal layer, then the scalp edges. A sterile dressing was applied.  Olivia Payne was then taken out of the [place], had the shoulder roll removed and was positioned flat on the bed. Her right upper and lower quadrants were prepped and draped in a sterile fashion. I opened the skin in the right upper quadrant with a  A 10 blade. I created a subcutaneous pocket with monopolar cautery and placed the bone underneath the skin. I closed the incision with vicryl sutures and used dermabond for a sterile dressing. She was taken intubated to the NICU.   PLAN OF CARE: Admit to inpatient   PATIENT DISPOSITION:  ICU - intubated and hemodynamically stable.   Delay start of Pharmacological VTE agent (>24hrs) due to surgical blood loss or risk of bleeding:  yes

## 2012-05-26 NOTE — Procedures (Signed)
S/P RT common carotid arteriogram followed by near total obliteration of AVM using ONYX 18 and ONYX 34 liquid embolic .

## 2012-05-26 NOTE — Anesthesia Postprocedure Evaluation (Signed)
  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 PLACEMENT OF BONE FLAB IN ABDOMINAL WALL (Right) - RIGHT Craniectomy for arteriovenous malformation resection  Patient Location: ICU  Anesthesia Type:General  Level of Consciousness: sedated and Patient remains intubated per anesthesia plan  Airway and Oxygen Therapy: Patient remains intubated per anesthesia plan and Patient placed on Ventilator (see vital sign flow sheet for setting)  Post-op Pain: none  Post-op Assessment: Post-op Vital signs reviewed, Patient's Cardiovascular Status Stable, Respiratory Function Stable, Patent Airway and Pain level controlled, remains intubated as per Deveshwar and Cabbell  Post-op Vital Signs: Reviewed and stable  Complications: No apparent anesthesia complications

## 2012-05-26 NOTE — Progress Notes (Signed)
Spoke with Ralene Muskrat PA-C at 0800 and reported that Dr. Corliss Skains wanted to be called with lab results and asked if she knew if it was regarding both UA and CBC diff or just UA as was discussed with her earlier.  She called Dr. Corliss Skains and reported to me that if the UA was normal we would proceed and she would not be held up for the CBC diff.  Reported this to [facility] with Anesthesia.  Called Valerie back and reported WBC of 12.7 and she said she would call Dr. Corliss Skains before they put in central line.

## 2012-05-27 ENCOUNTER — Encounter (HOSPITAL_COMMUNITY): Payer: Self-pay | Admitting: *Deleted

## 2012-05-27 DIAGNOSIS — E1139 Type 2 diabetes mellitus with other diabetic ophthalmic complication: Secondary | ICD-10-CM

## 2012-05-27 DIAGNOSIS — E039 Hypothyroidism, unspecified: Secondary | ICD-10-CM

## 2012-05-27 DIAGNOSIS — J95821 Acute postprocedural respiratory failure: Secondary | ICD-10-CM

## 2012-05-27 DIAGNOSIS — I10 Essential (primary) hypertension: Secondary | ICD-10-CM

## 2012-05-27 DIAGNOSIS — F411 Generalized anxiety disorder: Secondary | ICD-10-CM

## 2012-05-27 DIAGNOSIS — Q283 Other malformations of cerebral vessels: Principal | ICD-10-CM

## 2012-05-27 LAB — BASIC METABOLIC PANEL
CO2: 20 mEq/L (ref 19–32)
Chloride: 108 mEq/L (ref 96–112)
Creatinine, Ser: 0.65 mg/dL (ref 0.50–1.10)
GFR calc Af Amer: >90 mL/min (ref >90)
Potassium: 3.8 mEq/L (ref 3.5–5.1)

## 2012-05-27 LAB — CBC WITH DIFFERENTIAL/PLATELET
Basophils Absolute: 0 10*3/uL (ref 0.0–0.1)
Eosinophils Absolute: 0 10*3/uL (ref 0.0–0.7)
Eosinophils Relative: 0 % (ref 0–5)
Lymphs Abs: 1.1 10*3/uL (ref 0.7–4.0)
MCH: 24.3 pg — ABNORMAL LOW (ref 26.0–34.0)
MCV: 75.9 fL — ABNORMAL LOW (ref 78.0–100.0)
Platelets: 277 10*3/uL (ref 150–400)
RDW: 16.9 % — ABNORMAL HIGH (ref 11.5–15.5)

## 2012-05-27 LAB — GLUCOSE, CAPILLARY: Glucose-Capillary: 230 mg/dL — ABNORMAL HIGH (ref 70–99)

## 2012-05-27 MED ORDER — INSULIN ASPART 100 UNIT/ML ~~LOC~~ SOLN
0.0000 [IU] | Freq: Three times a day (TID) | SUBCUTANEOUS | Status: DC
  Administered 2012-05-27: 5 [IU] via SUBCUTANEOUS

## 2012-05-27 MED ORDER — CHLORHEXIDINE GLUCONATE 0.12 % MT SOLN
15.0000 mL | Freq: Two times a day (BID) | OROMUCOSAL | Status: DC
  Administered 2012-05-27 (×2): 15 mL via OROMUCOSAL
  Filled 2012-05-27 (×2): qty 15

## 2012-05-27 MED ORDER — NICARDIPINE HCL IN NACL 40-0.83 MG/200ML-% IV SOLN
5.0000 mg/h | INTRAVENOUS | Status: DC
  Administered 2012-05-27: 13.5 mg/h via INTRAVENOUS
  Filled 2012-05-27: qty 200

## 2012-05-27 MED ORDER — INSULIN ASPART 100 UNIT/ML ~~LOC~~ SOLN
0.0000 [IU] | Freq: Every day | SUBCUTANEOUS | Status: DC
  Administered 2012-05-27: 2 [IU] via SUBCUTANEOUS

## 2012-05-27 MED ORDER — LEVOTHYROXINE SODIUM 100 MCG IV SOLR
25.0000 ug | Freq: Every day | INTRAVENOUS | Status: DC
  Filled 2012-05-27: qty 5

## 2012-05-27 MED ORDER — FENTANYL CITRATE 0.05 MG/ML IJ SOLN
50.0000 ug | INTRAMUSCULAR | Status: DC | PRN
  Administered 2012-05-27 (×3): 100 ug via INTRAVENOUS
  Administered 2012-05-27: 50 ug via INTRAVENOUS
  Administered 2012-05-27: 100 ug via INTRAVENOUS
  Administered 2012-05-28: 50 ug via INTRAVENOUS
  Administered 2012-05-28 (×2): 100 ug via INTRAVENOUS
  Filled 2012-05-27 (×8): qty 2

## 2012-05-27 MED ORDER — INSULIN ASPART 100 UNIT/ML ~~LOC~~ SOLN
2.0000 [IU] | SUBCUTANEOUS | Status: DC
  Administered 2012-05-27 (×2): 6 [IU] via SUBCUTANEOUS
  Administered 2012-05-27: 100 [IU] via SUBCUTANEOUS
  Administered 2012-05-27: 6 [IU] via SUBCUTANEOUS

## 2012-05-27 MED ORDER — DULOXETINE HCL 60 MG PO CPEP
60.0000 mg | ORAL_CAPSULE | Freq: Every day | ORAL | Status: DC
  Administered 2012-05-27 – 2012-06-02 (×7): 60 mg via ORAL
  Filled 2012-05-27 (×7): qty 1

## 2012-05-27 MED ORDER — LISINOPRIL 10 MG PO TABS
10.0000 mg | ORAL_TABLET | Freq: Every day | ORAL | Status: DC
  Administered 2012-05-27 – 2012-06-02 (×6): 10 mg via ORAL
  Filled 2012-05-27 (×7): qty 1

## 2012-05-27 MED ORDER — BIOTENE DRY MOUTH MT LIQD
15.0000 mL | Freq: Four times a day (QID) | OROMUCOSAL | Status: DC
  Administered 2012-05-27 (×2): 15 mL via OROMUCOSAL

## 2012-05-27 MED ORDER — LEVOTHYROXINE SODIUM 50 MCG PO TABS
50.0000 ug | ORAL_TABLET | Freq: Every day | ORAL | Status: DC
  Administered 2012-05-27 – 2012-06-02 (×7): 50 ug via ORAL
  Filled 2012-05-27 (×9): qty 1

## 2012-05-27 MED ORDER — FLUCONAZOLE 150 MG PO TABS
150.0000 mg | ORAL_TABLET | Freq: Once | ORAL | Status: AC
  Administered 2012-05-27: 150 mg via ORAL
  Filled 2012-05-27: qty 1

## 2012-05-27 MED ORDER — PREGABALIN 75 MG PO CAPS: 150.0000 mg | ORAL_CAPSULE | Freq: Every day | ORAL | Status: DC | PRN

## 2012-05-27 MED ORDER — PROPOFOL 10 MG/ML IV EMUL
5.0000 ug/kg/min | INTRAVENOUS | Status: DC
  Administered 2012-05-27 (×2): 10 ug/kg/min via INTRAVENOUS
  Administered 2012-05-27: 50 ug/kg/min via INTRAVENOUS
  Administered 2012-05-27: 60 ug/kg/min via INTRAVENOUS
  Administered 2012-05-27: 10 ug/kg/min via INTRAVENOUS
  Administered 2012-05-27: 60 ug/kg/min via INTRAVENOUS
  Filled 2012-05-27 (×3): qty 100

## 2012-05-27 MED ORDER — LORAZEPAM 1 MG PO TABS
2.0000 mg | ORAL_TABLET | Freq: Two times a day (BID) | ORAL | Status: DC
  Administered 2012-05-27 – 2012-06-02 (×13): 2 mg via ORAL
  Filled 2012-05-27: qty 1
  Filled 2012-05-27 (×8): qty 2
  Filled 2012-05-27: qty 1
  Filled 2012-05-27: qty 2
  Filled 2012-05-27: qty 1
  Filled 2012-05-27: qty 2
  Filled 2012-05-27: qty 1
  Filled 2012-05-27: qty 2

## 2012-05-27 MED ORDER — ESCITALOPRAM OXALATE 10 MG PO TABS
10.0000 mg | ORAL_TABLET | Freq: Every day | ORAL | Status: DC
  Administered 2012-05-27 – 2012-06-02 (×7): 10 mg via ORAL
  Filled 2012-05-27 (×7): qty 1

## 2012-05-27 MED ORDER — LORAZEPAM 2 MG/ML IJ SOLN: 1.0000 mg | Freq: Two times a day (BID) | INTRAMUSCULAR | Status: DC

## 2012-05-27 MED ORDER — PNEUMOCOCCAL VAC POLYVALENT 25 MCG/0.5ML IJ INJ
0.5000 mL | INJECTION | INTRAMUSCULAR | Status: AC
  Administered 2012-05-28: 0.5 mL via INTRAMUSCULAR
  Filled 2012-05-27: qty 0.5

## 2012-05-27 MED ORDER — METFORMIN HCL ER 500 MG PO TB24
500.0000 mg | ORAL_TABLET | Freq: Every day | ORAL | Status: DC
  Administered 2012-05-27 – 2012-06-02 (×7): 500 mg via ORAL
  Filled 2012-05-27 (×9): qty 1

## 2012-05-27 MED ORDER — TOPIRAMATE 25 MG PO TABS
50.0000 mg | ORAL_TABLET | Freq: Every day | ORAL | Status: DC | PRN
  Administered 2012-05-30: 50 mg via ORAL
  Filled 2012-05-27 (×3): qty 2

## 2012-05-27 NOTE — Progress Notes (Signed)
Pt states she had yeast infection prior to surgery and has a hx of getting them frequently. Would like to take diflucan to treat it while in hospital. Dr. Franky Macho notified. Orders received. Pharmacy recommended one time dose 150mg  with option of additional dose(s) if needed.  Will cont. to monitor.

## 2012-05-27 NOTE — Procedures (Signed)
Extubation Procedure Note  Patient Details:   Name: Olivia Payne DOB: 12/14/1970 MRN: [id number]   Airway Documentation:  Airway (Active)    Evaluation  O2 sats: stable throughout Complications: No apparent complications Patient did tolerate procedure well. Bilateral Breath Sounds: Clear   Yes  Pt extubated per MD order.  Pt placed on 4l [place]. sats 99%, RT will continue to monitor.  Closson, Terie Purser 05/27/2012, 8:48 AM

## 2012-05-27 NOTE — Consult Note (Signed)
PULMONARY  / CRITICAL CARE MEDICINE  Name: Olivia Payne MRN: [id number] DOB: 02-Nov-1970    ADMISSION DATE:  05/26/2012 CONSULTATION DATE:  05/26/12  REFERRING MD :  Franky Macho PRIMARY SERVICE: NSGY  CHIEF COMPLAINT:  S/P craniotomy  BRIEF PATIENT DESCRIPTION: 42 y/o female with DM, history of AVM who was admitted on 3/26 for craniotomy and R posterior temporal lobe AVM resection.  PCCM consulted for vent management.  SIGNIFICANT EVENTS / STUDIES:  3/26 craniotomy >  LINES / TUBES: 3/26 ETT >> 3/26 R IJ CVL >> 3/26 L radial A-line>>  CULTURES:   ANTIBIOTICS: 3/26 cefazolin (peri-op) >>  HISTORY OF PRESENT ILLNESS:  42 y/o female with DM, history of AVM who was admitted on 3/26 for craniotomy and R posterior temporal lobe AVM resection.  PCCM consulted for vent management.  She underwent the procedure without complication.  She returned to the ICU ventilated and sedated so no further history could be taken.   PAST MEDICAL HISTORY :  Past Medical History  Diagnosis Date  . Asthma   . History of chicken pox   . Migraine   . Allergy   . Thyroid disease   . Neuropathy   . Hypertension   . Hypothyroidism   . Anxiety   . Depression   . Cerebral aneurysm   . Cerebral AVM   . Sleep apnea     does not use Cpap  . Diabetes mellitus without complication     borderline   Past Surgical History  Procedure Laterality Date  . Dilation and curettage of uterus  06/07/2002  . Eye surgery  01/12/2013  . Cesarean section  1995  . Cerebral arteriogram    . Radiology with anesthesia N/A 04/20/2012    Procedure: RADIOLOGY WITH ANESTHESIA;  Surgeon: Oneal Grout, MD;  Location: [facility];  Service: Radiology;  Laterality: N/A;   Prior to Admission medications   Medication Sig Start Date End Date Taking? Authorizing Provider  DULoxetine (CYMBALTA) 30 MG capsule Take 60 mg by mouth daily.    Yes Historical Provider, MD  escitalopram (LEXAPRO) 10 MG tablet Take 10 mg by mouth  daily.   Yes Historical Provider, MD  levothyroxine (SYNTHROID, LEVOTHROID) 50 MCG tablet Take 50 mcg by mouth daily.  05/21/11  Yes Historical Provider, MD  lisinopril (PRINIVIL,ZESTRIL) 10 MG tablet Take 10 mg by mouth daily.   Yes Historical Provider, MD  LORazepam (ATIVAN) 1 MG tablet Take 2 mg by mouth 2 (two) times daily.    Yes Historical Provider, MD  medroxyPROGESTERone (DEPO-PROVERA) 150 MG/ML injection Inject 150 mg into the muscle every 3 (three) months. Last dose administered 01/28/2012.   Yes Historical Provider, MD  metFORMIN (GLUCOPHAGE-XR) 500 MG 24 hr tablet Take 1 tablet (500 mg total) by mouth daily with breakfast. 08/26/11 08/25/12 Yes Romero Belling, MD  pregabalin (LYRICA) 75 MG capsule Take 150 mg by mouth daily as needed. For nerve pain   Yes Historical Provider, MD  traZODone (DESYREL) 50 MG tablet Take 100 mg by mouth at bedtime. For sleep   Yes Historical Provider, MD  HYDROcodone-acetaminophen (NORCO/VICODIN) 5-325 MG per tablet Take 1 tablet by mouth every 6 (six) hours as needed for pain.    Historical Provider, MD  topiramate (TOPAMAX) 50 MG tablet Take 50 mg by mouth daily as needed. For migraines    Historical Provider, MD   Allergies  Allergen Reactions  . Latex Other (See Comments)    "SEVERE REACTION"    FAMILY  HISTORY:  Family History  Problem Relation Age of Onset  . Cancer Mother     Breast Cancer  . Diabetes Father   . Heart disease Father   . Heart attack Father 38  . Cancer Paternal Aunt     Ovarian Cancer  . Diabetes Paternal Grandmother   . Cancer Other     Lung Cancer-Maternal side of family   SOCIAL HISTORY:  reports that she has quit smoking. She does not have any smokeless tobacco history on file. She reports that she does not drink alcohol or use illicit drugs.  REVIEW OF SYSTEMS:  Cannot obtain due to intubation  SUBJECTIVE:   VITAL SIGNS: Temp:  [97.3 F (36.3 C)-97.7 F (36.5 C)] 97.3 F (36.3 C) (03/27 0021) Pulse Rate:   [80-95] 88 (03/26 2222) Resp:  [16-24] 24 (03/26 2222) BP: (128)/(79) 128/79 mmHg (03/26 0645) SpO2:  [98 %-100 %] 100 % (03/26 2222) FiO2 (%):  [40 %-60 %] 40 % (03/26 2222) HEMODYNAMICS:   VENTILATOR SETTINGS: Vent Mode:  [-] PRVC FiO2 (%):  [40 %-60 %] 40 % Set Rate:  [16 bmp-24 bmp] 24 bmp Vt Set:  [530 mL] 530 mL PEEP:  [5 cmH20] 5 cmH20 Plateau Pressure:  [25 cmH20-26 cmH20] 26 cmH20 INTAKE / OUTPUT: Intake/Output     03/26 0701 - 03/27 0700   I.V. 7800   IV Piggyback 250   Total Intake 8050   Urine 1600   Blood 800   Total Output 2400   Net +5650         PHYSICAL EXAMINATION:  Gen: morbidly obese, sedated on vent HEENT: s/p craniotomy, dressing/drain in place; ETT in place PULM: CTA B CV: RRR, no mgr, no JVD AB: BS+, soft, nontender, no hsm Ext: warm, trace edema, no clubbing, no cyanosis Derm: no rash or skin breakdown Neuro: sedated on vent   LABS:  Recent Labs Lab 05/24/12 1130 05/26/12 0749  05/26/12 1824 05/26/12 1925 05/26/12 2014 05/26/12 2155 05/26/12 2340  HGB 13.4 13.5  < > 11.6* 11.2* 9.9*  --   --   WBC 10.7* 12.7*  --   --   --   --   --   --   PLT 276 273  --   --   --   --   --   --   NA 138  --   < > 137 138 138  --   --   K 4.3  --   < > 4.4 4.3 4.3  --   --   CL 102  --   --   --   --   --   --   --   CO2 26  --   --   --   --   --   --   --   GLUCOSE 217*  --   --  183* 186*  --   --   --   BUN 8  --   --   --   --   --   --   --   CREATININE 0.76  --   --   --   --   --   --   --   CALCIUM 9.1  --   --   --   --   --   --   --   AST 12  --   --   --   --   --   --   --  ALT 12  --   --   --   --   --   --   --   ALKPHOS 114  --   --   --   --   --   --   --   BILITOT 0.7  --   --   --   --   --   --   --   PROT 6.9  --   --   --   --   --   --   --   ALBUMIN 3.1*  --   --   --   --   --   --   --   APTT 33  --   --   --   --   --   --   --   INR 1.04  --   --   --   --   --   --   --   PHART  --   --   < >  --   --   7.301* 7.261* 7.365  PCO2ART  --   --   < >  --   --  41.9 44.2 33.2*  PO2ART  --   --   < >  --   --  340.0* 116.0* 80.8  < > = values in this interval not displayed.  Recent Labs Lab 05/26/12 0649  GLUCAP 178*    3/26 CXR: ETT in place, CVL in place, some atelectasis  ASSESSMENT / PLAN:  NEUROLOGIC A:  AVM, s/p resection 3/26 Chronic benzo use P:   -post op care per NSGY -schedule ativan, low dose given chronic use -anti-epileptics per NSGY -sedation titrated to RASS -1 with propofol and fentanyl  PULMONARY A: Post op respiratory failure P:   -full vent support -daily CXR -WUA/SBT 3/27  CARDIOVASCULAR A: hypertension P:  -nicardipine gtt -parameters per NSGY -restart home BP meds 3/27  RENAL A:  No acute issues P:   -BMET 3/27 AM  GASTROINTESTINAL A:  No acute issues P:   -OG tube -ppi for stress ulcer prophylaxis  HEMATOLOGIC A:  No acute issues P:  -SCD's given recent neurosurgery  INFECTIOUS A:  No acute issues P:     ENDOCRINE A:  DM2 P:   -ICU hyperglycemia protocol   TODAY'S SUMMARY:   I have personally obtained a history, examined the patient, evaluated laboratory and imaging results, formulated the assessment and plan and placed orders. CRITICAL CARE: The patient is critically ill with multiple organ systems failure and requires high complexity decision making for assessment and support, frequent evaluation and titration of therapies, application of advanced monitoring technologies and extensive interpretation of multiple databases. Critical Care Time devoted to patient care services described in this note is 45 minutes.   Fonnie Jarvis Pulmonary and Critical Care Medicine [facility] Pager: [phone]  05/27/2012, 12:44 AM

## 2012-05-27 NOTE — Progress Notes (Signed)
1 Day Post-Op  Subjective: Rt temporal AVM embolization 3/26 with Dr Corliss Skains Then to OR with Dr Franky Macho same day for resection Pt has done well post op  Objective: Vital signs in last 24 hours: Temp:  [97.3 F (36.3 C)-98.4 F (36.9 C)] 98.4 F (36.9 C) (03/27 0456) Pulse Rate:  [80-123] 98 (03/27 1000) Resp:  [16-32] 20 (03/27 1000) BP: (96-133)/(55-78) 103/59 mmHg (03/27 1000) SpO2:  [97 %-100 %] 100 % (03/27 1000) Arterial Line BP: (110-145)/(57-77) 132/71 mmHg (03/27 1000) FiO2 (%):  [39.8 %-60 %] 40.1 % (03/27 0800) Weight:  [334 lb 9.6 oz (151.774 kg)-348 lb 5.2 oz (158 kg)] 334 lb 9.6 oz (151.774 kg) (03/27 0021)    Intake/Output from previous day: 03/26 0701 - 03/27 0700 In: 9610.3 [I.V.:9155.3; IV Piggyback:455] Out: 3800 [Urine:3000; Blood:800] Intake/Output this shift: Total I/O In: 392.8 [I.V.:287.8; IV Piggyback:105] Out: -   PE:  Pt groggy Moving all 4s Restless in bed "hot and thirsty" Site of cranial surgery visible; clean  Groin site clean and dry; no hematoma 2+pulses Rt foot Wbc high- due to steroids  Lab Results:   Recent Labs  05/26/12 0749  05/26/12 2014 05/27/12 0500  WBC 12.7*  --   --  16.0*  HGB 13.5  < > 9.9* 10.9*  HCT 41.3  < > 29.0* 34.1*  PLT 273  --   --  277  < > = values in this interval not displayed. BMET  Recent Labs  05/24/12 1130  05/26/12 1925 05/26/12 2014 05/27/12 0500  NA 138  < > 138 138 139  K 4.3  < > 4.3 4.3 3.8  CL 102  --   --   --  108  CO2 26  --   --   --  20  GLUCOSE 217*  < > 186*  --  281*  BUN 8  --   --   --  8  CREATININE 0.76  --   --   --  0.65  CALCIUM 9.1  --   --   --  7.9*  < > = values in this interval not displayed. PT/INR  Recent Labs  05/24/12 1130  LABPROT 13.5  INR 1.04   ABG  Recent Labs  05/26/12 2155 05/26/12 2340  PHART 7.261* 7.365  HCO3 19.2* 18.5*    Studies/Results: Ct Head Wo Contrast  05/26/2012  *RADIOLOGY REPORT*  Clinical Data: Preop for AVN.  CT  HEAD WITHOUT CONTRAST  Technique:  Contiguous axial images were obtained from the base of the skull through the vertex without contrast.  Comparison: Catheter angiogram 03/15/2012 and 05/26/2012  Findings: Embolization of right temporal parietal AVM using ONYX liquid embolic. There is considerable streak artifact from the embolization material. There is a small amount of embolic material within the right transverse and sigmoid sinus.  Enlarged feeding vessels in the sylvian fissure are noted.  No acute infarct.  Negative for acute hemorrhage.  Ventricle size is normal and there is no midline shift.  Stealth protocol.  The patient is intubated  and has an NG tube.  IMPRESSION: Embolization of right temporal parietal AVM.  No prior studies are available.  No acute infarct or hemorrhage is identified.   Original Report Authenticated By: Janeece Riggers, M.D.    Dg Chest Port 1 View  05/26/2012  *RADIOLOGY REPORT*  Clinical Data: Evaluate endotracheal tube position.  Central line placement.  PORTABLE CHEST - 1 VIEW  Comparison: 04/19/2012 chest radiograph.  Findings: Exam  is under penetrated.  The carina is difficult to visualize but the endotracheal tube tip is at the level of the clavicles, apparently by 0.7 cm from the carina.  Right IJ central line is present with the tip in the mid SVC.  Lower lung volumes than on prior radiographs with perihilar and basilar atelectasis.  No airspace disease.  IMPRESSION:  1.  Endotracheal tube tip at the level of the clavicles, apparently 5.7 cm from the carina.  Carina difficult to visualize due to underpenetration. 2.  Right IJ central line appears in good position.  No right pneumothorax.  Tip is in the mid SVC.   Original Report Authenticated By: Andreas Newport, M.D.     Anti-infectives: Anti-infectives   Start     Dose/Rate Route Frequency Ordered Stop   05/26/12 2145  ceFAZolin (ANCEF) IVPB 2 g/50 mL premix     2 g 100 mL/hr over 30 Minutes Intravenous Every 8 hours  05/26/12 2133 05/27/12 0627   05/26/12 1424  ceFAZolin (ANCEF) 2-3 GM-% IVPB SOLR    Comments:  KEY, JENNIFER: cabinet override      05/26/12 1424 05/27/12 0229   05/26/12 0600  ceFAZolin (ANCEF) 3 g in dextrose 5 % 50 mL IVPB  Status:  Discontinued     3 g 160 mL/hr over 30 Minutes Intravenous On call to O.R. 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 PLACEMENT OF BONE FLAB IN ABDOMINAL WALL (Right) - RIGHT Craniectomy for arteriovenous malformation resection  R temporal AVM embolization with Dr Corliss Skains 3/26 Pt has done well Then went to OR with Dr Franky Macho for resection   LOS: 1 day    Olivia Payne A 05/27/2012

## 2012-05-27 NOTE — Progress Notes (Signed)
PULMONARY  / CRITICAL CARE MEDICINE  Name: Olivia Payne MRN: [id number] DOB: [birth year]    ADMISSION DATE:  05/26/2012 CONSULTATION DATE:  05/26/12  REFERRING MD :  Franky Macho PRIMARY SERVICE: NSGY  CHIEF COMPLAINT:  S/P craniotomy  BRIEF PATIENT DESCRIPTION: 42 y/o female with DM, history of AVM who was admitted on 3/26 for craniotomy and R posterior temporal lobe AVM resection.  PCCM consulted for vent management.  SIGNIFICANT EVENTS / STUDIES:  3/26 craniotomy >  LINES / TUBES: 3/26 ETT >> 3/26 R IJ CVL >> 3/26 L radial A-line>>  CULTURES:   ANTIBIOTICS: 3/26 cefazolin (peri-op) >>  SUBJECTIVE:  Agitated on WUA.  Tolerating pressure support.  VITAL SIGNS: Temp:  [97.3 F (36.3 C)-98.4 F (36.9 C)] 98.4 F (36.9 C) (03/27 0456) Pulse Rate:  [80-123] 123 (03/27 0758) Resp:  [16-32] 26 (03/27 0758) BP: (108-133)/(55-78) 108/58 mmHg (03/27 0700) SpO2:  [97 %-100 %] 99 % (03/27 0758) Arterial Line BP: (110-145)/(57-68) 145/66 mmHg (03/27 0758) FiO2 (%):  [39.8 %-60 %] 39.9 % (03/27 0758) Weight:  [334 lb 9.6 oz (151.774 kg)-348 lb 5.2 oz (158 kg)] 334 lb 9.6 oz (151.774 kg) (03/27 0021) HEMODYNAMICS:   VENTILATOR SETTINGS: Vent Mode:  [-] PRVC FiO2 (%):  [39.8 %-60 %] 39.9 % Set Rate:  [16 bmp-24 bmp] 24 bmp Vt Set:  [530 mL] 530 mL PEEP:  [5 cmH20] 5 cmH20 Plateau Pressure:  [25 cmH20-26 cmH20] 26 cmH20 INTAKE / OUTPUT: Intake/Output     03/26 0701 - 03/27 0700 03/27 0701 - 03/28 0700   I.V. (mL/kg) 9155.3 (60.3)    IV Piggyback 455    Total Intake(mL/kg) 9610.3 (63.3)    Urine (mL/kg/hr) 3000 (0.8)    Blood 800 (0.2)    Total Output 3800     Net +5810.3            PHYSICAL EXAMINATION: Gen: Agitated with WUA HEENT: ETT in place PULM: no wheeze CV: S1S2 regular AB: soft, non tender Ext: ankle edema Derm: no rash Neuro: moves extremities   LABS:  Recent Labs Lab 05/24/12 1130 05/26/12 0749  05/26/12 1824 05/26/12 1925 05/26/12 2014  05/26/12 2155 05/26/12 2340 05/27/12 0500  HGB 13.4 13.5  < > 11.6* 11.2* 9.9*  --   --  10.9*  WBC 10.7* 12.7*  --   --   --   --   --   --  16.0*  PLT 276 273  --   --   --   --   --   --  277  NA 138  --   < > 137 138 138  --   --  139  K 4.3  --   < > 4.4 4.3 4.3  --   --  3.8  CL 102  --   --   --   --   --   --   --  108  CO2 26  --   --   --   --   --   --   --  20  GLUCOSE 217*  --   --  183* 186*  --   --   --  281*  BUN 8  --   --   --   --   --   --   --  8  CREATININE 0.76  --   --   --   --   --   --   --  0.65  CALCIUM  9.1  --   --   --   --   --   --   --  7.9*  AST 12  --   --   --   --   --   --   --   --   ALT 12  --   --   --   --   --   --   --   --   ALKPHOS 114  --   --   --   --   --   --   --   --   BILITOT 0.7  --   --   --   --   --   --   --   --   PROT 6.9  --   --   --   --   --   --   --   --   ALBUMIN 3.1*  --   --   --   --   --   --   --   --   APTT 33  --   --   --   --   --   --   --   --   INR 1.04  --   --   --   --   --   --   --   --   PHART  --   --   < >  --   --  7.301* 7.261* 7.365  --   PCO2ART  --   --   < >  --   --  41.9 44.2 33.2*  --   PO2ART  --   --   < >  --   --  340.0* 116.0* 80.8  --   < > = values in this interval not displayed.  Recent Labs Lab 05/26/12 0649  GLUCAP 178*    Imaging: Ct Head Wo Contrast  05/26/2012  *RADIOLOGY REPORT*  Clinical Data: Preop for AVN.  CT HEAD WITHOUT CONTRAST  Technique:  Contiguous axial images were obtained from the base of the skull through the vertex without contrast.  Comparison: Catheter angiogram 03/15/2012 and 05/26/2012  Findings: Embolization of right temporal parietal AVM using ONYX liquid embolic. There is considerable streak artifact from the embolization material. There is a small amount of embolic material within the right transverse and sigmoid sinus.  Enlarged feeding vessels in the sylvian fissure are noted.  No acute infarct.  Negative for acute hemorrhage.  Ventricle size is  normal and there is no midline shift.  Stealth protocol.  The patient is intubated  and has an NG tube.  IMPRESSION: Embolization of right temporal parietal AVM.  No prior studies are available.  No acute infarct or hemorrhage is identified.   Original Report Authenticated By: Janeece Riggers, M.D.    Dg Chest Port 1 View  05/26/2012  *RADIOLOGY REPORT*  Clinical Data: Evaluate endotracheal tube position.  Central line placement.  PORTABLE CHEST - 1 VIEW  Comparison: 04/19/2012 chest radiograph.  Findings: Exam is under penetrated.  The carina is difficult to visualize but the endotracheal tube tip is at the level of the clavicles, apparently by 0.7 cm from the carina.  Right IJ central line is present with the tip in the mid SVC.  Lower lung volumes than on prior radiographs with perihilar and basilar atelectasis.  No airspace disease.  IMPRESSION:  1.  Endotracheal tube tip at the level of the  clavicles, apparently 5.7 cm from the carina.  Carina difficult to visualize due to underpenetration. 2.  Right IJ central line appears in good position.  No right pneumothorax.  Tip is in the mid SVC.   Original Report Authenticated By: Andreas Newport, M.D.      ASSESSMENT / PLAN:  NEUROLOGIC A:  AVM, s/p resection 3/26. Hx of anxiety/depression with chronic benzo use. P:   Post op care, decadron per neurosurgery Keppra for seizure prevention Resume cymbalta, lexapro, lyrica, trazodone, tomapax when able to swallow Scheduled ativan 1 mg bid >> outpt regimen  PULMONARY A: Post op respiratory failure. Hx of Asthma. Hx of OSA >> non compliant with CPAP as outpt. P:   Extubate 3/27 Oxygen as needed to keep SpO2 > 92% PRN ventolin F/u CXR as needed  CARDIOVASCULAR A: Hx of hypertension. P:  Hold lisinopril for now Cardene as needed to keep SBP 140 to 160  RENAL A:  No acute issues P:   Monitor renal fx, urine outpt, electrolytes  GASTROINTESTINAL A:  Nutrition. P:   Will need swallow  evaluation after extubation Protonix for SUP  HEMATOLOGIC A:  Anemia. P:  F/u CBC SCD for DVT prevention  INFECTIOUS A:  No issues. P:   Monitor fever curve  ENDOCRINE A:  DM2 with steroid induced hyperglycemia. Hx of hypothyroidism. P:   SSI Hold glucophage IV synthroid at 25 mcg qdaily (half pt's home po dose) until able to swallow   CC time 35 minutes.  Coralyn Helling, MD [facility] Pulmonary/Critical Care 05/27/2012, 8:39 AM Pager:  [phone] After 3pm call: [phone]

## 2012-05-27 NOTE — Progress Notes (Signed)
BP 126/60  Pulse 109  Temp(Src) 97.1 F (36.2 C) (Oral)  Resp 23  Ht 5\' 8"  (1.727 m)  Wt 151.774 kg (334 lb 9.6 oz)  BMI 50.89 kg/m2  SpO2 100% Alert and oriented x 4, speech is clear and fluent Perrl, full eom Possible superior left quadrantanopia deficit Symmetric facies No drift on exam Doing well post op.

## 2012-05-27 NOTE — Progress Notes (Signed)
UR completed 

## 2012-05-27 NOTE — Progress Notes (Signed)
Inpatient Diabetes Program Recommendations  AACE/ADA: New Consensus Statement on Inpatient Glycemic Control (2013)  Target Ranges:  Prepandial:   less than 140 mg/dL      Peak postprandial:   less than 180 mg/dL (1-2 hours)      Critically ill patients:  140 - 180 mg/dL  Pt on decadron taper. Now ordered diet.  Hyperglycemia protocol is still in active orders; correction scale from ICU protocol still being used. Pls discontinue the ICU protocol orders and order from the Glycemic control order set using resistant correction q 4 hrs While on decadron taper.  Thank you, Lenor Coffin, RN, CNS, Diabetes Coordinator [phone])

## 2012-05-28 ENCOUNTER — Inpatient Hospital Stay (HOSPITAL_COMMUNITY): Payer: Managed Care, Other (non HMO)

## 2012-05-28 LAB — BASIC METABOLIC PANEL
CO2: 26 mEq/L (ref 19–32)
Calcium: 8.2 mg/dL — ABNORMAL LOW (ref 8.4–10.5)
Chloride: 103 mEq/L (ref 96–112)
Glucose, Bld: 245 mg/dL — ABNORMAL HIGH (ref 70–99)
Potassium: 4.2 mEq/L (ref 3.5–5.1)
Sodium: 138 mEq/L (ref 135–145)

## 2012-05-28 LAB — CBC
HCT: 29.7 % — ABNORMAL LOW (ref 36.0–46.0)
Hemoglobin: 9.7 g/dL — ABNORMAL LOW (ref 12.0–15.0)
MCV: 76.2 fL — ABNORMAL LOW (ref 78.0–100.0)
Platelets: 237 10*3/uL (ref 150–400)
RBC: 3.9 MIL/uL (ref 3.87–5.11)
WBC: 19.1 10*3/uL — ABNORMAL HIGH (ref 4.0–10.5)

## 2012-05-28 LAB — GLUCOSE, CAPILLARY
Glucose-Capillary: 255 mg/dL — ABNORMAL HIGH (ref 70–99)
Glucose-Capillary: 189 mg/dL — ABNORMAL HIGH (ref 70–99)
Glucose-Capillary: 208 mg/dL — ABNORMAL HIGH (ref 70–99)

## 2012-05-28 MED ORDER — DEXAMETHASONE 4 MG PO TABS
4.0000 mg | ORAL_TABLET | Freq: Four times a day (QID) | ORAL | Status: AC
  Administered 2012-05-28 (×2): 4 mg via ORAL
  Filled 2012-05-28 (×2): qty 1

## 2012-05-28 MED ORDER — HYDROCODONE-ACETAMINOPHEN 5-325 MG PO TABS
2.0000 | ORAL_TABLET | ORAL | Status: DC | PRN
  Administered 2012-05-29 – 2012-06-02 (×10): 2 via ORAL
  Filled 2012-05-28 (×10): qty 2

## 2012-05-28 MED ORDER — HYDROCODONE-ACETAMINOPHEN 5-325 MG PO TABS
1.0000 | ORAL_TABLET | Freq: Four times a day (QID) | ORAL | Status: DC | PRN
  Administered 2012-05-28 (×2): 1 via ORAL
  Filled 2012-05-28 (×2): qty 1

## 2012-05-28 MED ORDER — PANTOPRAZOLE SODIUM 40 MG PO TBEC
40.0000 mg | DELAYED_RELEASE_TABLET | Freq: Every day | ORAL | Status: DC
  Administered 2012-05-28 – 2012-05-31 (×4): 40 mg via ORAL
  Filled 2012-05-28 (×2): qty 1

## 2012-05-28 MED ORDER — INSULIN ASPART 100 UNIT/ML ~~LOC~~ SOLN
0.0000 [IU] | Freq: Three times a day (TID) | SUBCUTANEOUS | Status: DC
  Administered 2012-05-28: 7 [IU] via SUBCUTANEOUS
  Administered 2012-05-29: 4 [IU] via SUBCUTANEOUS
  Administered 2012-05-29 (×2): 7 [IU] via SUBCUTANEOUS
  Administered 2012-05-29: 5 [IU] via SUBCUTANEOUS
  Administered 2012-05-30 (×2): 3 [IU] via SUBCUTANEOUS
  Administered 2012-05-30: 4 [IU] via SUBCUTANEOUS
  Administered 2012-05-31 – 2012-06-02 (×7): 3 [IU] via SUBCUTANEOUS

## 2012-05-28 MED ORDER — DEXAMETHASONE 4 MG PO TABS
4.0000 mg | ORAL_TABLET | Freq: Three times a day (TID) | ORAL | Status: AC
  Administered 2012-05-28 – 2012-05-29 (×3): 4 mg via ORAL
  Filled 2012-05-28 (×4): qty 1

## 2012-05-28 MED ORDER — INSULIN ASPART 100 UNIT/ML ~~LOC~~ SOLN
0.0000 [IU] | Freq: Three times a day (TID) | SUBCUTANEOUS | Status: DC
Start: 2012-05-29 — End: 2012-05-28

## 2012-05-28 MED ORDER — INSULIN ASPART 100 UNIT/ML ~~LOC~~ SOLN
0.0000 [IU] | Freq: Three times a day (TID) | SUBCUTANEOUS | Status: DC
  Administered 2012-05-28: 4 [IU] via SUBCUTANEOUS
  Administered 2012-05-28: 7 [IU] via SUBCUTANEOUS
  Administered 2012-05-28: 11 [IU] via SUBCUTANEOUS

## 2012-05-28 MED ORDER — LEVETIRACETAM 500 MG PO TABS
500.0000 mg | ORAL_TABLET | Freq: Two times a day (BID) | ORAL | Status: DC
  Administered 2012-05-28 – 2012-06-02 (×10): 500 mg via ORAL
  Filled 2012-05-28 (×11): qty 1

## 2012-05-28 MED ORDER — INSULIN ASPART 100 UNIT/ML ~~LOC~~ SOLN: 0.0000 [IU] | Freq: Every day | SUBCUTANEOUS | Status: DC

## 2012-05-28 NOTE — Progress Notes (Signed)
PULMONARY  / CRITICAL CARE MEDICINE  Name: Olivia Payne MRN: [id number] DOB: 08/01/1970    ADMISSION DATE:  05/26/2012 CONSULTATION DATE:  05/26/12  REFERRING MD :  Franky Macho PRIMARY SERVICE: NSGY  CHIEF COMPLAINT:  S/P craniotomy  BRIEF PATIENT DESCRIPTION: 42 y/o female with DM, history of AVM who was admitted on 3/26 for craniotomy and R posterior temporal lobe AVM resection.  PCCM consulted for vent management.  SIGNIFICANT EVENTS / STUDIES:  3/26 craniotomy >  LINES / TUBES: 3/26 ETT >> 3/27 3/26 R IJ CVL >> 3/26 L radial A-line>> 3/27  CULTURES:   ANTIBIOTICS: 3/26 cefazolin (peri-op) >> 3/27   SUBJECTIVE:  Walked in hall yesterday.  Tolerating diet.  VITAL SIGNS: Temp:  [97.1 F (36.2 C)-99 F (37.2 C)] 98.4 F (36.9 C) (03/28 0340) Pulse Rate:  [74-120] 76 (03/28 0800) Resp:  [11-29] 11 (03/28 0800) BP: (100-135)/(53-77) 114/67 mmHg (03/28 0800) SpO2:  [95 %-100 %] 96 % (03/28 0800) Arterial Line BP: (146-150)/(75-77) 150/77 mmHg (03/27 1200) INTAKE / OUTPUT: Intake/Output     03/27 0701 - 03/28 0700 03/28 0701 - 03/29 0700   P.O. 1720    I.V. (mL/kg) 962.8 (6.3)    IV Piggyback 105    Total Intake(mL/kg) 2787.8 (18.4)    Urine (mL/kg/hr) 4475 (1.2)    Blood     Total Output 4475     Net -1687.2          Urine Occurrence 1 x      PHYSICAL EXAMINATION: Gen: No distress HEENT: no sinus tenderness PULM: no wheeze CV: S1S2 regular AB: soft, non tender Ext: no edema Derm: no rash Neuro: moves extremities   LABS:  Recent Labs Lab 05/24/12 1130 05/26/12 0749  05/26/12 1925 05/26/12 2014 05/26/12 2155 05/26/12 2340 05/27/12 0500 05/28/12 0226  HGB 13.4 13.5  < > 11.2* 9.9*  --   --  10.9* 9.7*  WBC 10.7* 12.7*  --   --   --   --   --  16.0* 19.1*  PLT 276 273  --   --   --   --   --  277 237  NA 138  --   < > 138 138  --   --  139 138  K 4.3  --   < > 4.3 4.3  --   --  3.8 4.2  CL 102  --   --   --   --   --   --  108 103  CO2 26  --    --   --   --   --   --  20 26  GLUCOSE 217*  --   < > 186*  --   --   --  281* 245*  BUN 8  --   --   --   --   --   --  8 7  CREATININE 0.76  --   --   --   --   --   --  0.65 0.58  CALCIUM 9.1  --   --   --   --   --   --  7.9* 8.2*  AST 12  --   --   --   --   --   --   --   --   ALT 12  --   --   --   --   --   --   --   --  ALKPHOS 114  --   --   --   --   --   --   --   --   BILITOT 0.7  --   --   --   --   --   --   --   --   PROT 6.9  --   --   --   --   --   --   --   --   ALBUMIN 3.1*  --   --   --   --   --   --   --   --   APTT 33  --   --   --   --   --   --   --   --   INR 1.04  --   --   --   --   --   --   --   --   PHART  --   --   < >  --  7.301* 7.261* 7.365  --   --   PCO2ART  --   --   < >  --  41.9 44.2 33.2*  --   --   PO2ART  --   --   < >  --  340.0* 116.0* 80.8  --   --   < > = values in this interval not displayed.  Recent Labs Lab 05/27/12 0818 05/27/12 1214 05/27/12 1738 05/27/12 2153 05/28/12 0824  GLUCAP 233* 230* 204* 235* 255*    Imaging: Ct Head Wo Contrast  05/26/2012  *RADIOLOGY REPORT*  Clinical Data: Preop for AVN.  CT HEAD WITHOUT CONTRAST  Technique:  Contiguous axial images were obtained from the base of the skull through the vertex without contrast.  Comparison: Catheter angiogram 03/15/2012 and 05/26/2012  Findings: Embolization of right temporal parietal AVM using ONYX liquid embolic. There is considerable streak artifact from the embolization material. There is a small amount of embolic material within the right transverse and sigmoid sinus.  Enlarged feeding vessels in the sylvian fissure are noted.  No acute infarct.  Negative for acute hemorrhage.  Ventricle size is normal and there is no midline shift.  Stealth protocol.  The patient is intubated  and has an NG tube.  IMPRESSION: Embolization of right temporal parietal AVM.  No prior studies are available.  No acute infarct or hemorrhage is identified.   Original Report Authenticated  By: Janeece Riggers, M.D.    Portable Chest Xray In Am  05/28/2012  *RADIOLOGY REPORT*  Clinical Data: Craniotomy for arteriovenous malformation.  PORTABLE CHEST - 1 VIEW  Comparison: 03/26 and 04/19/2012  Findings: Central line good position.  Endotracheal tube has been removed.  Heart size and pulmonary vascularity are normal considering the shallow inspiration.  The slight haziness in both lungs most likely represents the effect of shallow inspiration.  No effusions.  IMPRESSION: Shallow inspiration accentuates markings.  No acute abnormalities.   Original Report Authenticated By: Francene Boyers, M.D.    Dg Chest Port 1 View  05/26/2012  *RADIOLOGY REPORT*  Clinical Data: Evaluate endotracheal tube position.  Central line placement.  PORTABLE CHEST - 1 VIEW  Comparison: 04/19/2012 chest radiograph.  Findings: Exam is under penetrated.  The carina is difficult to visualize but the endotracheal tube tip is at the level of the clavicles, apparently by 0.7 cm from the carina.  Right IJ central line is present with the tip in the mid SVC.  Lower lung volumes  than on prior radiographs with perihilar and basilar atelectasis.  No airspace disease.  IMPRESSION:  1.  Endotracheal tube tip at the level of the clavicles, apparently 5.7 cm from the carina.  Carina difficult to visualize due to underpenetration. 2.  Right IJ central line appears in good position.  No right pneumothorax.  Tip is in the mid SVC.   Original Report Authenticated By: Andreas Newport, M.D.      ASSESSMENT / PLAN:  NEUROLOGIC A:  AVM, s/p resection 3/26. Hx of anxiety/depression with chronic benzo use. P:   Post op care, decadron per neurosurgery Keppra for seizure prevention Continue cymbalta, lexapro, lyrica, trazodone, tomapax Scheduled ativan 1 mg bid  PULMONARY A: Post op respiratory failure >> resolved. Hx of Asthma. Hx of OSA >> non compliant with CPAP as outpt. P:   Oxygen as needed to keep SpO2 > 92% PRN  ventolin  CARDIOVASCULAR A: Hx of hypertension. P:  Continue lisinopril   RENAL A:  No acute issues P:   Monitor renal fx, urine outpt, electrolytes  GASTROINTESTINAL A:  Nutrition. P:   Mod carb diet Protonix for SUP  HEMATOLOGIC A:  Anemia. P:  F/u CBC SCD for DVT prevention  INFECTIOUS A:  No issues. P:   Monitor fever curve  ENDOCRINE A:  DM2 with steroid induced hyperglycemia. Hx of hypothyroidism. P:   SSI while on steroids Resume glucophage Continue synthroid  PCCM available as needed over weekend while she is in ICU.  If she transfers to floor, then recommend consulting [facility] if medicine assistance needed.  Coralyn Helling, MD [facility] Pulmonary/Critical Care 05/28/2012, 10:16 AM Pager:  [phone] After 3pm call: [phone]

## 2012-05-28 NOTE — Plan of Care (Signed)
Problem: Phase I Progression Outcomes Goal: Voiding-avoid urinary catheter unless indicated Outcome: Completed/Met Date Met:  05/28/12 3/27 - catheter removed

## 2012-05-28 NOTE — Progress Notes (Signed)
2 Days Post-Op  Subjective: Rt temporal AVM embolization performed in IR 3/26 Same day OR for resection with Dr Franky Macho Pt doing well Headache  Objective: Vital signs in last 24 hours: Temp:  [97.1 F (36.2 C)-99 F (37.2 C)] 98.4 F (36.9 C) (03/28 0340) Pulse Rate:  [74-120] 76 (03/28 0800) Resp:  [11-29] 11 (03/28 0800) BP: (100-135)/(53-77) 114/67 mmHg (03/28 0800) SpO2:  [95 %-100 %] 96 % (03/28 0800) Arterial Line BP: (146-150)/(75-77) 150/77 mmHg (03/27 1200)    Intake/Output from previous day: 03/27 0701 - 03/28 0700 In: 2787.8 [P.O.:1720; I.V.:962.8; IV Piggyback:105] Out: 4475 [Urine:4475] Intake/Output this shift:    PE:  Afeb; VSS Appropriate Moves all 4s Rt groin NT; no bleeding; No hematoma Rt foot 1+ pulses   Lab Results:   Recent Labs  05/27/12 0500 05/28/12 0226  WBC 16.0* 19.1*  HGB 10.9* 9.7*  HCT 34.1* 29.7*  PLT 277 237   BMET  Recent Labs  05/27/12 0500 05/28/12 0226  NA 139 138  K 3.8 4.2  CL 108 103  CO2 20 26  GLUCOSE 281* 245*  BUN 8 7  CREATININE 0.65 0.58  CALCIUM 7.9* 8.2*   PT/INR No results found for this basename: LABPROT, INR,  in the last 72 hours ABG  Recent Labs  05/26/12 2155 05/26/12 2340  PHART 7.261* 7.365  HCO3 19.2* 18.5*    Studies/Results: Ct Head Wo Contrast  05/26/2012  *RADIOLOGY REPORT*  Clinical Data: Preop for AVN.  CT HEAD WITHOUT CONTRAST  Technique:  Contiguous axial images were obtained from the base of the skull through the vertex without contrast.  Comparison: Catheter angiogram 03/15/2012 and 05/26/2012  Findings: Embolization of right temporal parietal AVM using ONYX liquid embolic. There is considerable streak artifact from the embolization material. There is a small amount of embolic material within the right transverse and sigmoid sinus.  Enlarged feeding vessels in the sylvian fissure are noted.  No acute infarct.  Negative for acute hemorrhage.  Ventricle size is normal and there  is no midline shift.  Stealth protocol.  The patient is intubated  and has an NG tube.  IMPRESSION: Embolization of right temporal parietal AVM.  No prior studies are available.  No acute infarct or hemorrhage is identified.   Original Report Authenticated By: Janeece Riggers, M.D.    Portable Chest Xray In Am  05/28/2012  *RADIOLOGY REPORT*  Clinical Data: Craniotomy for arteriovenous malformation.  PORTABLE CHEST - 1 VIEW  Comparison: 03/26 and 04/19/2012  Findings: Central line good position.  Endotracheal tube has been removed.  Heart size and pulmonary vascularity are normal considering the shallow inspiration.  The slight haziness in both lungs most likely represents the effect of shallow inspiration.  No effusions.  IMPRESSION: Shallow inspiration accentuates markings.  No acute abnormalities.   Original Report Authenticated By: Francene Boyers, M.D.    Dg Chest Port 1 View  05/26/2012  *RADIOLOGY REPORT*  Clinical Data: Evaluate endotracheal tube position.  Central line placement.  PORTABLE CHEST - 1 VIEW  Comparison: 04/19/2012 chest radiograph.  Findings: Exam is under penetrated.  The carina is difficult to visualize but the endotracheal tube tip is at the level of the clavicles, apparently by 0.7 cm from the carina.  Right IJ central line is present with the tip in the mid SVC.  Lower lung volumes than on prior radiographs with perihilar and basilar atelectasis.  No airspace disease.  IMPRESSION:  1.  Endotracheal tube tip at the level of  the clavicles, apparently 5.7 cm from the carina.  Carina difficult to visualize due to underpenetration. 2.  Right IJ central line appears in good position.  No right pneumothorax.  Tip is in the mid SVC.   Original Report Authenticated By: Andreas Newport, M.D.     Anti-infectives: Anti-infectives   Start     Dose/Rate Route Frequency Ordered Stop   05/27/12 1400  fluconazole (DIFLUCAN) tablet 150 mg     150 mg Oral  Once 05/27/12 1355 05/27/12 1534   05/26/12  2145  ceFAZolin (ANCEF) IVPB 2 g/50 mL premix     2 g 100 mL/hr over 30 Minutes Intravenous Every 8 hours 05/26/12 2133 05/27/12 0627   05/26/12 1424  ceFAZolin (ANCEF) 2-3 GM-% IVPB SOLR    Comments:  KEY, JENNIFER: cabinet override      05/26/12 1424 05/27/12 0229   05/26/12 0600  ceFAZolin (ANCEF) 3 g in dextrose 5 % 50 mL IVPB  Status:  Discontinued     3 g 160 mL/hr over 30 Minutes Intravenous On call to O.R. 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 PLACEMENT OF BONE FLAB IN ABDOMINAL WALL (Right) - RIGHT Craniectomy for arteriovenous malformation resection   Rt temporal AVM embolization 3/26 with Dr Corliss Skains OR for resection same day. Doing well Plan per Neuro  LOS: 2 days    Nikoletta Varma A 05/28/2012

## 2012-05-28 NOTE — Progress Notes (Signed)
Patient ID: Olivia Payne, female   DOB: 1970-03-15, 42 y.o.   MRN: [id number] BP 139/57  Pulse 87  Temp(Src) 97.8 F (36.6 C) (Oral)  Resp 18  Ht 5\' 8"  (1.727 m)  Wt 151.774 kg (334 lb 9.6 oz)  BMI 50.89 kg/m2  SpO2 97% Alert and oriented x 4 Speech is clear and fluent No drift, moving all extremities Incisions are clean, dry, and without signs of infection. Transfer to floor.

## 2012-05-29 LAB — GLUCOSE, CAPILLARY: Glucose-Capillary: 230 mg/dL — ABNORMAL HIGH (ref 70–99)

## 2012-05-29 MED ORDER — SODIUM CHLORIDE 0.9 % IJ SOLN
10.0000 mL | INTRAMUSCULAR | Status: DC | PRN
Start: 2012-05-29 — End: 2012-06-02
  Administered 2012-05-29: 20 mL
  Administered 2012-05-30 (×2): 10 mL
  Administered 2012-05-30 – 2012-05-31 (×2): 20 mL
  Administered 2012-06-01: 10 mL
  Administered 2012-06-01: 20 mL

## 2012-05-29 NOTE — Progress Notes (Signed)
Pt ambulated with RN and husband in the hallway early this am.  Pt walks with L eye closed, reports it helps her to focus with the R eye.  Steady but very slow and shuffling gait.  Pt is very eager to walk.  Back to bed without incidence.

## 2012-05-29 NOTE — Progress Notes (Signed)
Doing well. Up moving around  Temp:  [97.8 F (36.6 C)-98.5 F (36.9 C)] 98.4 F (36.9 C) (03/29 0600) Pulse Rate:  [75-87] 75 (03/29 0600) Resp:  [14-19] 18 (03/29 0600) BP: (105-139)/(43-67) 125/64 mmHg (03/29 0600) SpO2:  [95 %-97 %] 95 % (03/29 0600)  AAOx4 , speech fluent Good strength and sensation Incision sl drainage  Plan: CPM

## 2012-05-30 LAB — GLUCOSE, CAPILLARY
Glucose-Capillary: 203 mg/dL — ABNORMAL HIGH (ref 70–99)
Glucose-Capillary: 159 mg/dL — ABNORMAL HIGH (ref 70–99)
Glucose-Capillary: 131 mg/dL — ABNORMAL HIGH (ref 70–99)
Glucose-Capillary: 118 mg/dL — ABNORMAL HIGH (ref 70–99)

## 2012-05-30 NOTE — Progress Notes (Signed)
Pt ambulated in the hallway with RN and husband.  Pt walked with both eyes open this morning and with more stability and speed than the previous night, pt also more alert during this shift in general.  Pt still very eager to walk.  Will continue to monitor.

## 2012-05-30 NOTE — Progress Notes (Signed)
Patient ID: Olivia Payne, female   DOB: 06-12-1970, 42 y.o.   MRN: [id number] Patient is stable. She has some headache. Otherwise denies numbness tingling or weakness. Dressing dry. Good strength. Continue to mobilize.

## 2012-05-31 ENCOUNTER — Encounter (HOSPITAL_COMMUNITY): Payer: Self-pay | Admitting: Neurosurgery

## 2012-05-31 LAB — GLUCOSE, CAPILLARY
Glucose-Capillary: 109 mg/dL — ABNORMAL HIGH (ref 70–99)
Glucose-Capillary: 120 mg/dL — ABNORMAL HIGH (ref 70–99)

## 2012-05-31 MED ORDER — KETOROLAC TROMETHAMINE 30 MG/ML IJ SOLN
30.0000 mg | Freq: Four times a day (QID) | INTRAMUSCULAR | Status: DC | PRN
  Administered 2012-05-31 – 2012-06-01 (×2): 30 mg via INTRAVENOUS
  Filled 2012-05-31 (×2): qty 1

## 2012-05-31 NOTE — Progress Notes (Signed)
PULMONARY  / CRITICAL CARE MEDICINE  Name: Olivia Payne MRN: [id number] DOB: Jan 08, 1971    ADMISSION DATE:  05/26/2012 CONSULTATION DATE:  05/26/12  REFERRING MD :  Franky Macho PRIMARY SERVICE: NSGY  CHIEF COMPLAINT:  S/P craniotomy  BRIEF PATIENT DESCRIPTION: 42 y/o female with DM, history of AVM who was admitted on 3/26 for craniotomy and R posterior temporal lobe AVM resection.  PCCM consulted for vent management.  SIGNIFICANT EVENTS / STUDIES:  3/26 craniotomy   LINES / TUBES: 3/26 ETT >> 3/27 3/26 R IJ CVL >> 3/26 L radial A-line>> 3/27  CULTURES:   ANTIBIOTICS: 3/26 cefazolin (peri-op) >> 3/27   SUBJECTIVE:  Feels good other than persistent headache.  Husband indicates Dr. Franky Macho aware.   VITAL SIGNS: Temp:  [97.7 F (36.5 C)-98.7 F (37.1 C)] 98 F (36.7 C) (03/31 1400) Pulse Rate:  [72-93] 93 (03/31 1400) Resp:  [16-18] 16 (03/31 1400) BP: (109-136)/(52-86) 109/52 mmHg (03/31 1400) SpO2:  [97 %-100 %] 98 % (03/31 1400)  INTAKE / OUTPUT: Intake/Output     03/30 0701 - 03/31 0700 03/31 0701 - 04/01 0700   P.O.     Total Intake(mL/kg)     Urine (mL/kg/hr) 3150 (0.9)    Total Output 3150     Net -3150          Urine Occurrence 1 x      PHYSICAL EXAMINATION: Gen: No distress HEENT: mm pink/moist, no jvd, R side head with staples, dried blood PULM: no wheeze CV: S1S2 regular AB: soft, non tender Ext: no edema Derm: no rash Neuro: moves extremities   LABS:  Recent Labs Lab 05/26/12 0749  05/26/12 1925 05/26/12 2014 05/26/12 2155 05/26/12 2340 05/27/12 0500 05/28/12 0226  HGB 13.5  < > 11.2* 9.9*  --   --  10.9* 9.7*  WBC 12.7*  --   --   --   --   --  16.0* 19.1*  PLT 273  --   --   --   --   --  277 237  NA  --   < > 138 138  --   --  139 138  K  --   < > 4.3 4.3  --   --  3.8 4.2  CL  --   --   --   --   --   --  108 103  CO2  --   --   --   --   --   --  20 26  GLUCOSE  --   < > 186*  --   --   --  281* 245*  BUN  --   --   --   --    --   --  8 7  CREATININE  --   --   --   --   --   --  0.65 0.58  CALCIUM  --   --   --   --   --   --  7.9* 8.2*  PHART  --   < >  --  7.301* 7.261* 7.365  --   --   PCO2ART  --   < >  --  41.9 44.2 33.2*  --   --   PO2ART  --   < >  --  340.0* 116.0* 80.8  --   --   < > = values in this interval not displayed.  Recent Labs Lab 05/30/12 1112 05/30/12 1631 05/30/12 2125  05/31/12 0643 05/31/12 1104  GLUCAP 136* 131* 118* 109* 124*    Imaging: No results found.   ASSESSMENT / PLAN:  NEUROLOGIC A:   AVM, s/p resection 3/26. Hx of anxiety/depression with chronic benzo use.  P:   Post op care per neurosurgery Keppra for seizure prevention Continue cymbalta, lexapro Scheduled ativan bid  PULMONARY A:  Post op respiratory failure >> resolved. Hx of Asthma. Hx of OSA >> non compliant with CPAP as outpt.  P:   Pulmonary hygiene  CARDIOVASCULAR A:  Hx of hypertension.  P:  Continue lisinopril   GASTROINTESTINAL A:  Nutrition.  P:   Mod carb diet Protonix for SUP >> d/c 3/31  HEMATOLOGIC A:   Anemia.  P:  F/u CBC SCD for DVT prevention    ENDOCRINE A:   DM2 with steroid induced hyperglycemia. Hx of hypothyroidism.  P:   SSI while on steroids Continue glucophage Continue synthroid   PCCM will sign off as patient out of ICU / Stable.   Thank you for the consult.  If IM desired, consider TRH consult.     Canary Brim, NP-C [place] [facility] Pgr: [phone] or [phone]  Reviewed above, examined pt, and agree with assessment/plan.  Can consult [facility] if additional medical assistance needed.  Coralyn Helling, MD [facility] Pulmonary/Critical Care 05/31/2012, 4:27 PM Pager:  [phone] After 3pm call: [phone]

## 2012-05-31 NOTE — Evaluation (Signed)
Physical Therapy Evaluation Patient Details Name: Olivia Payne MRN: [id number] DOB: [birth year] Today's Date: 05/31/2012 Time: 4098-1191 PT Time Calculation (min): 23 min  PT Assessment / Plan / Recommendation Clinical Impression  Pt is a 42 yo female s/p R craniectomy for AVM malformation/aneurysm. Pt with noted L vision deficits, generalized weakness, mild cognitive deficts, and impaired balance. Pt safe to d/c home with 24/7 assist provided by spouse and HHPT. Pt con't to have R sided headache as well.     PT Assessment  Patient needs continued PT services    Follow Up Recommendations  Home health PT;Supervision/Assistance - 24 hour    Does the patient have the potential to tolerate intense rehabilitation      Barriers to Discharge None      Equipment Recommendations   (3n1 commode)    Recommendations for Other Services     Frequency Min 3X/week    Precautions / Restrictions Precautions Precautions: Fall Precaution Comments: pt to benefit from helmet to protect patient brain due to pt at increased falls risk and with R craniectomy (pt with impaired L vision. pt with minimal central vision) Restrictions Weight Bearing Restrictions: No   Pertinent Vitals/Pain Pt did not rate her R sided headache      Mobility  Bed Mobility Bed Mobility: Sit to Sidelying Left Sit to Sidelying Left: 5: Supervision;HOB flat Details for Bed Mobility Assistance: safe technique Transfers Transfers: Sit to Stand;Stand to Sit Sit to Stand: 4: Min assist;With upper extremity assist;From toilet Stand to Sit: 4: Min guard;With upper extremity assist;To bed Details for Transfer Assistance: v/c's for hand placement, increased time, wide base of support Ambulation/Gait Ambulation/Gait Assistance: 4: Min assist Ambulation Distance (Feet): 100 Feet Assistive device: 1 person hand held assist Ambulation/Gait Assistance Details: wide base of support, mildly unsteady. toward end of ambulation pt  with R antalgic limp however denies R LE pain, c/o R sided headache Gait Pattern: Step-through pattern;Decreased stride length;Wide base of support Gait velocity: slow Stairs: Yes Stairs Assistance: 4: Min assist Stairs Assistance Details (indicate cue type and reason): minA to steady pt Stair Management Technique: Two rails Number of Stairs: 3    Exercises     PT Diagnosis: Difficulty walking;Acute pain  PT Problem List: Decreased strength;Decreased balance;Decreased mobility PT Treatment Interventions: Gait training;Stair training;Functional mobility training;Therapeutic activities;Therapeutic exercise;Balance training   PT Goals Acute Rehab PT Goals PT Goal Formulation: With patient/family Time For Goal Achievement: 06/07/12 Potential to Achieve Goals: Good Pt will go Supine/Side to Sit: with modified independence;with HOB 0 degrees PT Goal: Supine/Side to Sit - Progress: Goal set today Pt will go Sit to Supine/Side: with modified independence;with HOB 0 degrees PT Goal: Sit to Supine/Side - Progress: Goal set today Pt will go Sit to Stand: with supervision;with upper extremity assist PT Goal: Sit to Stand - Progress: Goal set today Pt will Ambulate: >150 feet;with supervision PT Goal: Ambulate - Progress: Goal set today Pt will Go Up / Down Stairs: 6-9 stairs;with supervision;with rail(s) PT Goal: Up/Down Stairs - Progress: Goal set today  Visit Information  Last PT Received On: 05/31/12 Assistance Needed: +1    Subjective Data  Subjective: Pt received on commode with assist of spouse. Patient Stated Goal: home   Prior Functioning  Home Living Lives With: Spouse Available Help at Discharge: Family;Available 24 hours/day Type of Home: House Home Access: Stairs to enter [employer] of Steps: 6-7 Entrance Stairs-Rails: Can reach both Home Layout: One level Bathroom Shower/Tub: Health visitor: Standard  Bathroom Accessibility: Yes How  Accessible: Accessible via walker Home Adaptive Equipment: None Prior Function Level of Independence: Independent Able to Take Stairs?: Yes Driving: No Vocation: Full time employment Comments: pt unable to drive due to vision deficits, was working full time up until surgery Communication Communication: No difficulties Dominant Hand: Right    Cognition  Cognition Overall Cognitive Status: Impaired Area of Impairment: Safety/judgement;Awareness of deficits Arousal/Alertness: Awake/alert Orientation Level: Oriented X4 / Intact Behavior During Session: [facility] for tasks performed Safety/Judgement: Decreased safety judgement for tasks assessed;Decreased awareness of need for assistance Safety/Judgement - Other Comments: pt does report she can't walk by herself however desires to take shower independently Awareness of Deficits: decreased insight to deficts in regard to mobility. Pt aware of vision deficits    Extremity/Trunk Assessment Right Upper Extremity Assessment RUE ROM/Strength/Tone: [facility] for tasks assessed Left Upper Extremity Assessment LUE ROM/Strength/Tone: WFL for tasks assessed Right Lower Extremity Assessment RLE ROM/Strength/Tone: [facility] for tasks assessed Left Lower Extremity Assessment LLE ROM/Strength/Tone: WFL for tasks assessed Trunk Assessment Trunk Assessment: Normal   Balance    End of Session PT - End of Session Equipment Utilized During Treatment: Gait belt Activity Tolerance: Patient tolerated treatment well Patient left: in bed;with call bell/phone within reach;with family/visitor present Nurse Communication: Mobility status  GP     Marcene Brawn 05/31/2012, 4:26 PM  Lewis Shock, PT, DPT Pager #: [phone] Office #: [phone]

## 2012-06-01 LAB — GLUCOSE, CAPILLARY
Glucose-Capillary: 162 mg/dL — ABNORMAL HIGH (ref 70–99)
Glucose-Capillary: 120 mg/dL — ABNORMAL HIGH (ref 70–99)

## 2012-06-01 NOTE — Evaluation (Signed)
Occupational Therapy Evaluation Patient Details Name: Olivia Payne MRN: [id number] DOB: 1970/08/06 Today's Date: 06/01/2012 Time: 6578-4696 OT Time Calculation (min): 19 min  OT Assessment / Plan / Recommendation Clinical Impression  Pt demos decline in function with ADLs and ADL mobility safety following AVM. Pt would benefit from OT services to address these impairments to help retsore PLOF to return home safely    OT Assessment  Patient needs continued OT Services    Follow Up Recommendations  Home health OT    Barriers to Discharge None pt's spouse reports he will be on FMLA to assist pt at home  Equipment Recommendations  3 in 1 bedside comode;Tub/shower seat    Recommendations for Other Services    Frequency  Min 2X/week    Precautions / Restrictions Precautions Precautions: Fall Precaution Comments: pt to benefit from helmet to protect patient brain due to pt at increased falls risk and with R craniectomy Restrictions Weight Bearing Restrictions: No   Pertinent Vitals/Pain     ADL  Grooming: Performed;Wash/dry hands;Wash/dry face;Min guard Where Assessed - Grooming: Supported standing Upper Body Bathing: Simulated;Supervision/safety;Set up Lower Body Bathing: Simulated;Minimal assistance;Min guard Where Assessed - Lower Body Bathing: Unsupported sitting;Supported sit to stand Upper Body Dressing: Performed;Supervision/safety;Set up Lower Body Dressing: Performed;Min guard;Minimal assistance Where Assessed - Lower Body Dressing: Unsupported sitting;Supported sit to stand Toilet Transfer: Performed;Min guard Toilet Transfer Method: Sit to Barista: Regular height toilet;Raised toilet seat with arms (or 3-in-1 over toilet) Toileting - Clothing Manipulation and Hygiene: Performed;Min guard Where Assessed - Engineer, mining and Hygiene: Standing Tub/Shower Transfer: Performed;Min guard Web designer Method:  Science writer: Grab bars;Walk in shower Equipment Used: Gait belt    OT Diagnosis: Generalized weakness  OT Problem List: Decreased strength;Impaired vision/perception;Decreased knowledge of precautions;Decreased activity tolerance;Decreased safety awareness;Impaired balance (sitting and/or standing) OT Treatment Interventions: Self-care/ADL training;Balance training;Therapeutic exercise;Neuromuscular education;Therapeutic activities;DME and/or AE instruction;Patient/family education   OT Goals Acute Rehab OT Goals OT Goal Formulation: With patient/family Time For Goal Achievement: 06/08/12 Potential to Achieve Goals: Good ADL Goals Pt Will Perform Grooming: with set-up;Standing at sink ADL Goal: Grooming - Progress: Goal set today Pt Will Perform Lower Body Bathing: with set-up;with supervision ADL Goal: Lower Body Bathing - Progress: Goal set today Pt Will Perform Lower Body Dressing: with set-up;with supervision ADL Goal: Lower Body Dressing - Progress: Goal set today Pt Will Transfer to Toilet: with supervision;with DME;Grab bars ADL Goal: Toilet Transfer - Progress: Goal set today Pt Will Perform Toileting - Clothing Manipulation: with supervision;Standing ADL Goal: Toileting - Clothing Manipulation - Progress: Goal set today Pt Will Perform Tub/Shower Transfer: with supervision;with DME;Grab bars ADL Goal: Tub/Shower Transfer - Progress: Goal set today  Visit Information  Last OT Received On: 06/01/12    Subjective Data  Subjective: " I just feel tried, I hope I can go home this afternoon " Patient Stated Goal: To return home with family   Prior Functioning     Home Living Lives With: Spouse Available Help at Discharge: Family;Available 24 hours/day Type of Home: House Home Access: Stairs to enter [employer] of Steps: 6-7 Entrance Stairs-Rails: Can reach both Home Layout: One level Bathroom Shower/Tub: Therapist, art: Standard Bathroom Accessibility: Yes How Accessible: Accessible via walker Prior Function Level of Independence: Independent Able to Take Stairs?: Yes Driving: No Vocation: Full time employment Comments: Unable to drive due ti vision deficits, was still working full time up until surgery Communication Communication: No difficulties Dominant  Hand: Right         Vision/Perception Vision - History 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 Pt aware of vision deficits    Extremity/Trunk Assessment Right Upper Extremity Assessment RUE ROM/Strength/Tone: WFL for tasks assessed Left Upper Extremity Assessment LUE ROM/Strength/Tone: WFL for tasks assessed     Mobility Bed Mobility Bed Mobility: Supine to Sit;Sitting - Scoot to Edge of Bed;Sit to Supine Supine to Sit: 5: Supervision Sitting - Scoot to Edge of Bed: 5: Supervision Sit to Supine: 5: Supervision Transfers Sit to Stand: 4: Min guard;4: Min assist Stand to Sit: 4: Min guard;With upper extremity assist;To bed Details for Transfer Assistance: v/c's for hand placement, increased time, wide base of support     Exercise     Balance Balance Balance Assessed: No   End of Session OT - End of Session Equipment Utilized During Treatment: Gait belt Activity Tolerance: Patient tolerated treatment well Patient left: in bed;with call bell/phone within reach;with family/visitor present  GO     Galen Manila 06/01/2012, 1:36 PM

## 2012-06-01 NOTE — Progress Notes (Signed)
Physical Therapy Treatment Patient Details Name: Olivia Payne MRN: [id number] DOB: 08-12-1970 Today's Date: 06/01/2012 Time: 4098-1191 PT Time Calculation (min): 18 min  PT Assessment / Plan / Recommendation Comments on Treatment Session  Pt con't to have headache at incision. Pt remains to require 24/7 supervision/assist due to cognitive deficts and impaired balance. Spouse providing good support. pt safe to d/c home when medically stable.    Follow Up Recommendations  Home health PT;Supervision/Assistance - 24 hour     Does the patient have the potential to tolerate intense rehabilitation     Barriers to Discharge        Equipment Recommendations   (3n1 commode)    Recommendations for Other Services    Frequency Min 2X/week   Plan Discharge plan remains appropriate;Frequency needs to be updated    Precautions / Restrictions Precautions Precautions: Fall Precaution Comments: pt to benefit from helmet to protect patient brain due to pt at increased falls risk and with R craniectomy Restrictions Weight Bearing Restrictions: No   Pertinent Vitals/Pain Pt c/o R sided headache at surgical site    Mobility  Bed Mobility Bed Mobility: Not assessed Supine to Sit: 5: Supervision Sitting - Scoot to Edge of Bed: 5: Supervision Sit to Supine: 5: Supervision Transfers Transfers: Sit to Stand;Stand to Sit Sit to Stand: 4: Min guard;4: Min assist Stand to Sit: 4: Min guard;With upper extremity assist;To bed Details for Transfer Assistance: v/c's for safe hand placement Ambulation/Gait Ambulation/Gait Assistance: 4: Min assist Ambulation Distance (Feet): 200 Feet Assistive device: 1 person hand held assist Ambulation/Gait Assistance Details: pt con't to sway L/R with wide base of support. pt with increased unsteadiness when turning requiring minA to maintain balance Gait Pattern: Step-through pattern;Decreased stride length;Wide base of support Gait velocity: slow    Exercises      PT Diagnosis:    PT Problem List:   PT Treatment Interventions:     PT Goals Acute Rehab PT Goals PT Goal: Sit to Stand - Progress: Progressing toward goal PT Goal: Ambulate - Progress: Progressing toward goal PT Goal: Up/Down Stairs - Progress: Progressing toward goal  Visit Information  Last PT Received On: 06/01/12 Assistance Needed: +1    Subjective Data  Subjective: Pt received sitting on couch with report "my head wont stop hurting." Patient Stated Goal: home   Cognition  Cognition Overall Cognitive Status: Impaired Area of Impairment: Safety/judgement;Awareness of deficits Arousal/Alertness: Awake/alert Orientation Level: Oriented X4 / Intact Behavior During Session: [facility] for tasks performed Safety/Judgement: Decreased safety judgement for tasks assessed;Decreased awareness of need for assistance Safety/Judgement - Other Comments: pt does report she can't walk by herself however desires to take shower independently Awareness of Deficits: decreased insight to deficts in regard to mobility. Pt aware of vision deficits    Balance  Balance Balance Assessed: No  End of Session PT - End of Session Equipment Utilized During Treatment: Gait belt Activity Tolerance: Patient tolerated treatment well Patient left: in chair;with call bell/phone within reach;with family/visitor present Nurse Communication: Mobility status   GP     Marcene Brawn 06/01/2012, 5:13 PM  Lewis Shock, PT, DPT Pager #: [phone] Office #: [phone]

## 2012-06-01 NOTE — Progress Notes (Signed)
Patient ID: Olivia Payne, female   DOB: 12/31/1970, 42 y.o.   MRN: [id number] BP 95/44  Pulse 100  Temp(Src) 98.7 F (37.1 C) (Oral)  Resp 18  Ht 5\' 8"  (1.727 m)  Wt 151.774 kg (334 lb 9.6 oz)  BMI 50.89 kg/m2  SpO2 92% Alert and oriented x 4 perrl Moving all extremities Wound clean dry and without signs of infection Discharge tomorrow

## 2012-06-01 NOTE — Plan of Care (Signed)
Problem: Phase III Progression Outcomes Goal: Discharge plan remains appropriate-arrangements made Recommend HH OT for ADL trg and ADL mobility safety trg after acute care d/c     

## 2012-06-01 NOTE — Progress Notes (Signed)
   CARE MANAGEMENT NOTE 06/01/2012  Patient:  SHAQUEL, JOSEPHSON   Account Number:  [id number]  Date Initiated:  05/31/2012  Documentation initiated by:  [facility]  Subjective/Objective Assessment:   admitted postop craniectomy     Action/Plan:   plan return home with spouse, Micheal   Anticipated DC Date:  06/01/2012   Anticipated DC Plan:  HOME/SELF CARE      DC Planning Services  CM consult      [facility] Choice  HOME HEALTH   Choice offered to / List presented to:  C-3 Spouse           Status of service:  In process, will continue to follow Medicare Important Message given?   (If response is "NO", the following Medicare IM given date fields will be blank) Date Medicare IM given:   Date Additional Medicare IM given:    Discharge Disposition:    Per UR Regulation:  Reviewed for med. necessity/level of care/duration of stay  If discussed at Long Length of Stay Meetings, dates discussed:    Comments:  06/01/2012 1130 NCM spoke to husband, Casimiro Needle. Provided husband with [facility] agency list for possible HHPT. Requesting 3n1 for home. [name] RN CCM Case Mgmt phone [phone]

## 2012-06-02 LAB — TYPE AND SCREEN
ABO/RH(D): O NEG
Unit division: 0
Unit division: 0
Unit division: 0
Unit division: 0
Unit division: 0

## 2012-06-02 LAB — GLUCOSE, CAPILLARY

## 2012-06-02 MED ORDER — LEVETIRACETAM 500 MG PO TABS: 500.0000 mg | ORAL_TABLET | Freq: Two times a day (BID) | ORAL | Status: DC

## 2012-06-02 MED ORDER — HYDROCODONE-ACETAMINOPHEN 5-325 MG PO TABS: 1.0000 | ORAL_TABLET | Freq: Four times a day (QID) | ORAL | Status: DC | PRN

## 2012-06-02 NOTE — Progress Notes (Signed)
Pt ambulated around entire unit this morning with only standby assist. Patient was mostly steady on feet, but was easily distracted by surroundings and did not watch where she was walking. Tolerated well.

## 2012-06-02 NOTE — Discharge Summary (Signed)
Physician Discharge Summary  Patient ID: Olivia Payne MRN: [id number] DOB/AGE: 1970-05-01 42 y.o.  Admit date: 05/26/2012 Discharge date: 06/02/2012  Admission Diagnoses:AVM, cerebral right posterior temporal  Discharge Diagnoses:  Principal Problem:   AVM (arteriovenous malformation) brain Active Problems:   Type II or unspecified type diabetes mellitus with ophthalmic manifestations, not stated as uncontrolled(250.50)   Discharged Condition: good  Hospital Course: [name] was admitted and underwent an embolization of the AVM, then on the same day a craniectomy for AVM resection. Due to swelling in the temporal lobe the bone flap was placed in the abdominal wall. She has been moving all extremities well, Her wounds are clean dry, and without signs of infection. She is voiding, and tolerating a regular diet.   Consults: None  Significant Diagnostic Studies: angiography: cerebral embolization  Treatments: surgery: Right Temporal craniotomy for AVM resection.   Discharge Exam: Blood pressure 124/74, pulse 84, temperature 98.7 F (37.1 C), temperature source Oral, resp. rate 18, height 5\' 8"  (1.727 m), weight 151.774 kg (334 lb 9.6 oz), SpO2 97.00%. General appearance: alert, cooperative, appears stated age, mild distress and morbidly obese Neurologic: Mental status: Alert, oriented, thought content appropriate Cranial nerves: II: visual field upper quadrantanopia, left Motor: grossly normal Coordination: normal  Disposition: 01-Home or Self Care     Medication List    ASK your doctor about these medications       DULoxetine 30 MG capsule  Commonly known as:  CYMBALTA  Take 60 mg by mouth daily.     escitalopram 10 MG tablet  Commonly known as:  LEXAPRO  Take 10 mg by mouth daily.     HYDROcodone-acetaminophen 5-325 MG per tablet  Commonly known as:  NORCO/VICODIN  Take 1 tablet by mouth every 6 (six) hours as needed for pain.     levothyroxine 50 MCG tablet   Commonly known as:  SYNTHROID, LEVOTHROID  Take 50 mcg by mouth daily.     lisinopril 10 MG tablet  Commonly known as:  PRINIVIL,ZESTRIL  Take 10 mg by mouth daily.     LORazepam 1 MG tablet  Commonly known as:  ATIVAN  Take 2 mg by mouth 2 (two) times daily.     medroxyPROGESTERone 150 MG/ML injection  Commonly known as:  DEPO-PROVERA  Inject 150 mg into the muscle every 3 (three) months. Last dose administered 01/28/2012.     metFORMIN 500 MG 24 hr tablet  Commonly known as:  GLUCOPHAGE-XR  Take 1 tablet (500 mg total) by mouth daily with breakfast.     pregabalin 75 MG capsule  Commonly known as:  LYRICA  Take 150 mg by mouth daily as needed. For nerve pain     topiramate 50 MG tablet  Commonly known as:  TOPAMAX  Take 50 mg by mouth daily as needed. For migraines     traZODone 50 MG tablet  Commonly known as:  DESYREL  Take 100 mg by mouth at bedtime. For sleep           Follow-up Information   Follow up with home health physical therapy. [facility])       Follow up with Sherril Heyward L, MD In 1 week. (staple removal)    Contact information:   [address], [address]                         [address] [place] [address] [phone]  Signed: Samanthamarie Ezzell L 06/02/2012, 1:46 PM

## 2012-06-02 NOTE — Care Management Note (Signed)
    Page 1 of 2   06/02/2012     4:58:52 PM   CARE MANAGEMENT NOTE 06/02/2012  Patient:  Olivia Payne, Olivia Payne   Account Number:  [id number]  Date Initiated:  05/31/2012  Documentation initiated by:  [facility]  Subjective/Objective Assessment:   admitted postop craniectomy     Action/Plan:   plan return home with spouse, Olivia Payne   Anticipated DC Date:  06/01/2012   Anticipated DC Plan:  HOME W HOME HEALTH SERVICES      DC Planning Services  CM consult      [facility] Choice  HOME HEALTH   Choice offered to / List presented to:  C-3 Spouse        HH arranged  HH-2 PT  HH-3 OT      [facility] agency  [facility]   Status of service:  Completed, signed off Medicare Important Message given?   (If response is "NO", the following Medicare IM given date fields will be blank) Date Medicare IM given:   Date Additional Medicare IM given:    Discharge Disposition:  HOME W HOME HEALTH SERVICES  Per UR Regulation:  Reviewed for med. necessity/level of care/duration of stay  If discussed at Long Length of Stay Meetings, dates discussed:   06/01/2012    Comments:  06/02/12 Patient informed Olivia Payne that she would like [facility]. Contacted [facility] at [facility] and set up HHPT and HHOT. Olivia Cree RN, BSN, CCM   06/01/2012 1130 NCM spoke to husband, Olivia Needle. Provided husband with [facility] agency list for possible HHPT. Requesting 3n1 for home. Olivia Donning RN CCM Case Mgmt phone [phone]

## 2012-06-03 LAB — GLUCOSE, CAPILLARY: Glucose-Capillary: 122 mg/dL — ABNORMAL HIGH (ref 70–99)

## 2012-06-09 ENCOUNTER — Other Ambulatory Visit: Payer: Self-pay | Admitting: Neurosurgery

## 2012-06-09 ENCOUNTER — Other Ambulatory Visit (HOSPITAL_COMMUNITY): Payer: Self-pay | Admitting: Interventional Radiology

## 2012-06-09 DIAGNOSIS — Q2730 Arteriovenous malformation, site unspecified: Secondary | ICD-10-CM

## 2012-06-10 ENCOUNTER — Ambulatory Visit: Payer: Managed Care, Other (non HMO) | Admitting: Physical Therapy

## 2012-06-15 ENCOUNTER — Ambulatory Visit: Payer: Managed Care, Other (non HMO) | Attending: Neurosurgery | Admitting: Physical Therapy

## 2012-06-15 DIAGNOSIS — R51 Headache: Secondary | ICD-10-CM | POA: Insufficient documentation

## 2012-06-15 DIAGNOSIS — R5381 Other malaise: Secondary | ICD-10-CM | POA: Insufficient documentation

## 2012-06-15 DIAGNOSIS — M6281 Muscle weakness (generalized): Secondary | ICD-10-CM | POA: Insufficient documentation

## 2012-06-15 DIAGNOSIS — IMO0001 Reserved for inherently not codable concepts without codable children: Secondary | ICD-10-CM | POA: Insufficient documentation

## 2012-06-15 DIAGNOSIS — R262 Difficulty in walking, not elsewhere classified: Secondary | ICD-10-CM | POA: Insufficient documentation

## 2012-06-17 ENCOUNTER — Ambulatory Visit: Payer: Managed Care, Other (non HMO) | Admitting: Physical Therapy

## 2012-06-22 ENCOUNTER — Ambulatory Visit: Payer: Managed Care, Other (non HMO) | Admitting: Physical Therapy

## 2012-06-24 ENCOUNTER — Ambulatory Visit: Payer: Managed Care, Other (non HMO) | Admitting: Physical Therapy

## 2012-06-29 ENCOUNTER — Ambulatory Visit: Payer: Managed Care, Other (non HMO) | Admitting: Physical Therapy

## 2012-06-30 ENCOUNTER — Other Ambulatory Visit: Payer: Self-pay | Admitting: Radiology

## 2012-07-02 ENCOUNTER — Telehealth (HOSPITAL_COMMUNITY): Payer: Self-pay | Admitting: Interventional Radiology

## 2012-07-02 ENCOUNTER — Other Ambulatory Visit: Payer: Self-pay | Admitting: Radiology

## 2012-07-05 ENCOUNTER — Other Ambulatory Visit: Payer: Self-pay | Admitting: Radiology

## 2012-07-06 ENCOUNTER — Encounter (HOSPITAL_COMMUNITY): Payer: Self-pay

## 2012-07-06 ENCOUNTER — Ambulatory Visit (HOSPITAL_COMMUNITY)
Admission: RE | Admit: 2012-07-06 | Discharge: 2012-07-06 | Disposition: A | Discharge status: Home / Self Care | Payer: Managed Care, Other (non HMO) | Source: Ambulatory Visit | Attending: Interventional Radiology | Admitting: Interventional Radiology

## 2012-07-06 ENCOUNTER — Other Ambulatory Visit (HOSPITAL_COMMUNITY): Payer: Self-pay | Admitting: Interventional Radiology

## 2012-07-06 DIAGNOSIS — I10 Essential (primary) hypertension: Secondary | ICD-10-CM | POA: Insufficient documentation

## 2012-07-06 DIAGNOSIS — E669 Obesity, unspecified: Secondary | ICD-10-CM | POA: Insufficient documentation

## 2012-07-06 DIAGNOSIS — Z91040 Latex allergy status: Secondary | ICD-10-CM | POA: Insufficient documentation

## 2012-07-06 DIAGNOSIS — Z6841 Body Mass Index (BMI) 40.0 and over, adult: Secondary | ICD-10-CM | POA: Insufficient documentation

## 2012-07-06 DIAGNOSIS — Q2730 Arteriovenous malformation, site unspecified: Secondary | ICD-10-CM

## 2012-07-06 DIAGNOSIS — Z87891 Personal history of nicotine dependence: Secondary | ICD-10-CM | POA: Insufficient documentation

## 2012-07-06 DIAGNOSIS — Q283 Other malformations of cerebral vessels: Secondary | ICD-10-CM | POA: Insufficient documentation

## 2012-07-06 DIAGNOSIS — G43909 Migraine, unspecified, not intractable, without status migrainosus: Secondary | ICD-10-CM | POA: Insufficient documentation

## 2012-07-06 DIAGNOSIS — G4730 Sleep apnea, unspecified: Secondary | ICD-10-CM | POA: Insufficient documentation

## 2012-07-06 DIAGNOSIS — E039 Hypothyroidism, unspecified: Secondary | ICD-10-CM | POA: Insufficient documentation

## 2012-07-06 DIAGNOSIS — J45909 Unspecified asthma, uncomplicated: Secondary | ICD-10-CM | POA: Insufficient documentation

## 2012-07-06 DIAGNOSIS — F329 Major depressive disorder, single episode, unspecified: Secondary | ICD-10-CM | POA: Insufficient documentation

## 2012-07-06 DIAGNOSIS — G589 Mononeuropathy, unspecified: Secondary | ICD-10-CM | POA: Insufficient documentation

## 2012-07-06 DIAGNOSIS — F3289 Other specified depressive episodes: Secondary | ICD-10-CM | POA: Insufficient documentation

## 2012-07-06 DIAGNOSIS — E119 Type 2 diabetes mellitus without complications: Secondary | ICD-10-CM | POA: Insufficient documentation

## 2012-07-06 DIAGNOSIS — F411 Generalized anxiety disorder: Secondary | ICD-10-CM | POA: Insufficient documentation

## 2012-07-06 LAB — CBC WITH DIFFERENTIAL/PLATELET
Eosinophils Absolute: 0.2 10*3/uL (ref 0.0–0.7)
Eosinophils Relative: 2 % (ref 0–5)
HCT: 38 % (ref 36.0–46.0)
Hemoglobin: 12.5 g/dL (ref 12.0–15.0)
Lymphs Abs: 2.8 10*3/uL (ref 0.7–4.0)
MCH: 24.2 pg — ABNORMAL LOW (ref 26.0–34.0)
MCV: 73.6 fL — ABNORMAL LOW (ref 78.0–100.0)
Monocytes Absolute: 0.5 10*3/uL (ref 0.1–1.0)
Monocytes Relative: 5 % (ref 3–12)
RBC: 5.16 MIL/uL — ABNORMAL HIGH (ref 3.87–5.11)

## 2012-07-06 LAB — BASIC METABOLIC PANEL
CO2: 23 mEq/L (ref 19–32)
Glucose, Bld: 160 mg/dL — ABNORMAL HIGH (ref 70–99)
Potassium: 4 mEq/L (ref 3.5–5.1)
Sodium: 136 mEq/L (ref 135–145)

## 2012-07-06 LAB — GLUCOSE, CAPILLARY: Glucose-Capillary: 158 mg/dL — ABNORMAL HIGH (ref 70–99)

## 2012-07-06 MED ORDER — SODIUM CHLORIDE 0.9 % IV SOLN
Freq: Once | INTRAVENOUS | Status: AC
  Administered 2012-07-06: 12:00:00 via INTRAVENOUS

## 2012-07-06 MED ORDER — SODIUM CHLORIDE 0.9 % IV SOLN: INTRAVENOUS | Status: AC

## 2012-07-06 MED ORDER — MIDAZOLAM HCL 2 MG/2ML IJ SOLN
INTRAMUSCULAR | Status: AC | PRN
  Administered 2012-07-06 (×2): 1 mg via INTRAVENOUS

## 2012-07-06 MED ORDER — IOHEXOL 300 MG/ML  SOLN
150.0000 mL | Freq: Once | INTRAMUSCULAR | Status: AC | PRN
  Administered 2012-07-06: 84 mL via INTRA_ARTERIAL

## 2012-07-06 MED ORDER — MIDAZOLAM HCL 2 MG/2ML IJ SOLN
INTRAMUSCULAR | Status: AC
  Filled 2012-07-06: qty 4

## 2012-07-06 MED ORDER — FENTANYL CITRATE 0.05 MG/ML IJ SOLN
INTRAMUSCULAR | Status: AC | PRN
  Administered 2012-07-06 (×4): 25 ug via INTRAVENOUS

## 2012-07-06 MED ORDER — FENTANYL CITRATE 0.05 MG/ML IJ SOLN
INTRAMUSCULAR | Status: DC
Start: 2012-07-06 — End: 2012-07-07
  Filled 2012-07-06: qty 4

## 2012-07-06 NOTE — Progress Notes (Signed)
DR DEVESHWAR NOTIFIED OF HR LYING AND STANDING AND THAT CLIENT WALKED AND TOL WELL; NO C/O NAUSEA OR PAIN AND RIGHT GROIN STABLE; NO BLEEDING OR HEMATOMA

## 2012-07-06 NOTE — Procedures (Signed)
S/P 4 vessel cerebral arteriogram . Rt CFA approach . Findings. 1. No residual AVM in the Rt temporal parietal region. 2. Veinous drainage WNLs.

## 2012-07-06 NOTE — H&P (Signed)
Olivia Payne is an 42 y.o. female.   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 Asthma   . History of chicken pox   . Migraine   . Allergy   . Thyroid disease   . Neuropathy   . Hypertension   . Hypothyroidism   . Anxiety   . Depression   . Cerebral aneurysm   . Cerebral AVM   . Sleep apnea     does not use Cpap  . Diabetes mellitus without complication     borderline    Past Surgical History  Procedure Laterality Date  . Dilation and curettage of uterus  06/07/2002  . Eye surgery  01/12/2013  . Cesarean section  1995  . Cerebral arteriogram    . Radiology with anesthesia N/A 04/20/2012    Procedure: RADIOLOGY WITH ANESTHESIA;  Surgeon: Oneal Grout, MD;  Location: [facility];  Service: Radiology;  Laterality: N/A;  . Radiology with anesthesia N/A 05/26/2012    Procedure: RADIOLOGY WITH ANESTHESIA;  Surgeon: Oneal Grout, MD;  Location: [facility] OR;  Service: Radiology;  Laterality: N/A;  . Craniotomy Right 05/26/2012    Procedure: CRANIECTOMY INTRACRANIAL ARTERIO-VENOUS MALFORMATION DURAL COMPLEX (AVM). PLACEMENT OF BONE FLAB IN ABDOMINAL WALL;  Surgeon: Carmela Hurt, MD;  Location: [facility];  Service: Neurosurgery;  Laterality: Right;  RIGHT Craniectomy for arteriovenous malformation resection    Family History  Problem Relation Age of Onset  . Cancer Mother     Breast Cancer  . Diabetes Father   . Heart disease Father   . Heart attack Father 56  . Cancer Paternal Aunt     Ovarian Cancer  . Diabetes Paternal Grandmother   . Cancer Other     Lung Cancer-Maternal side of family   Social History:  reports that she has  quit smoking. She does not have any smokeless tobacco history on file. She reports that she does not drink alcohol or use illicit drugs.  Allergies:  Allergies  Allergen Reactions  . Latex Other (See Comments)    "SEVERE REACTION"     (Not in a hospital admission)  No results found for this or any previous visit (from the past 48 hour(s)). No results found.  Review of Systems  Constitutional: Negative for fever.  Eyes: Negative for blurred vision and double vision.  Respiratory: Negative for cough.   Cardiovascular: Negative for chest pain.  Gastrointestinal: Negative for nausea, vomiting and abdominal pain.  Neurological: Positive for weakness and headaches. Negative for dizziness.    There were no vitals taken for this visit. Physical Exam  Constitutional: She is oriented to person, place, and time. She appears well-developed.  obese  Cardiovascular: Normal rate, regular rhythm and normal heart sounds.   No murmur heard. Respiratory: Effort normal and breath sounds normal. She has no wheezes.  GI: Soft. Bowel sounds are normal. There is no tenderness.  Skull piece low rt abd- from craniectomy 05/26/12  Musculoskeletal: Normal range of motion.  Neurological: She is alert and oriented to person, place, and time. Coordination normal.  Psychiatric: She has a normal mood and affect. Her behavior is normal. Judgment and thought content normal.  Assessment/Plan Cerebral AVM embolization 05/26/12 Same day craniectomy for removal Scheduled now for re check cerebral arteriogram For skull piece re attachment in OR with Dr Franky Macho 07/09/12  Matias Thurman A 07/06/2012, 11:44 AM

## 2012-07-07 ENCOUNTER — Encounter (HOSPITAL_COMMUNITY): Payer: Self-pay

## 2012-07-07 ENCOUNTER — Encounter (HOSPITAL_COMMUNITY)
Admission: RE | Admit: 2012-07-07 | Discharge: 2012-07-07 | Disposition: A | Discharge status: Home / Self Care | Payer: Managed Care, Other (non HMO) | Source: Ambulatory Visit | Attending: Neurosurgery | Admitting: Neurosurgery

## 2012-07-07 HISTORY — DX: Gastro-esophageal reflux disease without esophagitis: K21.9

## 2012-07-07 LAB — TYPE AND SCREEN: ABO/RH(D): O NEG

## 2012-07-07 LAB — CBC
Hemoglobin: 12.8 g/dL (ref 12.0–15.0)
MCH: 24.8 pg — ABNORMAL LOW (ref 26.0–34.0)
RBC: 5.17 MIL/uL — ABNORMAL HIGH (ref 3.87–5.11)
WBC: 10.4 10*3/uL (ref 4.0–10.5)

## 2012-07-07 LAB — BASIC METABOLIC PANEL
CO2: 28 mEq/L (ref 19–32)
GFR calc non Af Amer: >90 mL/min (ref >90)
Glucose, Bld: 203 mg/dL — ABNORMAL HIGH (ref 70–99)
Potassium: 4.5 mEq/L (ref 3.5–5.1)
Sodium: 138 mEq/L (ref 135–145)

## 2012-07-07 NOTE — Pre-Procedure Instructions (Addendum)
REMA LIEVANOS  07/07/2012   Your procedure is scheduled on:  07/09/12  Report to [facility] at 800 AM.  Call this number if you have problems the morning of surgery: [phone]   Remember:   Do not eat food or drink liquids after midnight.   Take these medicines the morning of surgery with A SIP OF WATER: cymbalta pain med, lexapro,levothyroxine,keppra   Do not wear jewelry, make-up or nail polish.  Do not wear lotions, powders, or perfumes. You may wear deodorant.  Do not shave 48 hours prior to surgery. Men may shave face and neck.  Do not bring valuables to the hospital.  Contacts, dentures or bridgework may not be worn into surgery.  Leave suitcase in the car. After surgery it may be brought to your room.  For patients admitted to the hospital, checkout time is 11:00 AM the day of  discharge.   Patients discharged the day of surgery will not be allowed to drive  home.  Name and phone number of your driver:  Special Instructions: Shower using CHG 2 nights before surgery and the night before surgery.  If you shower the day of surgery use CHG.  Use special wash - you have one bottle of CHG for all showers.  You should use approximately 1/3 of the bottle for each shower.   Please read over the following fact sheets that you were given: Pain Booklet, Coughing and Deep Breathing, Blood Transfusion Information, MRSA Information and Surgical Site Infection Prevention

## 2012-07-08 MED ORDER — DEXTROSE 5 % IV SOLN
3.0000 g | INTRAVENOUS | Status: AC
  Administered 2012-07-09: 3 g via INTRAVENOUS
  Filled 2012-07-08: qty 3000

## 2012-07-09 ENCOUNTER — Encounter (HOSPITAL_COMMUNITY)
Admission: RE | Disposition: A | Discharge status: Rehabilitation Facility | Payer: Self-pay | Source: Ambulatory Visit | Attending: Neurosurgery

## 2012-07-09 ENCOUNTER — Inpatient Hospital Stay (HOSPITAL_COMMUNITY)
Admission: RE | Admit: 2012-07-09 | Discharge: 2012-07-16 | DRG: 516 | Disposition: A | Discharge status: Rehabilitation Facility | Payer: Managed Care, Other (non HMO) | Source: Ambulatory Visit | Attending: Neurosurgery | Admitting: Neurosurgery

## 2012-07-09 ENCOUNTER — Inpatient Hospital Stay (HOSPITAL_COMMUNITY): Payer: Managed Care, Other (non HMO)

## 2012-07-09 ENCOUNTER — Ambulatory Visit (HOSPITAL_COMMUNITY): Admission: RE | Admit: 2012-07-09 | Payer: Managed Care, Other (non HMO) | Source: Ambulatory Visit

## 2012-07-09 ENCOUNTER — Encounter (HOSPITAL_COMMUNITY): Payer: Self-pay | Admitting: Anesthesiology

## 2012-07-09 ENCOUNTER — Inpatient Hospital Stay (HOSPITAL_COMMUNITY): Payer: Managed Care, Other (non HMO) | Admitting: Anesthesiology

## 2012-07-09 ENCOUNTER — Encounter (HOSPITAL_COMMUNITY): Payer: Self-pay | Admitting: *Deleted

## 2012-07-09 DIAGNOSIS — Q282 Arteriovenous malformation of cerebral vessels: Secondary | ICD-10-CM

## 2012-07-09 DIAGNOSIS — Z01812 Encounter for preprocedural laboratory examination: Secondary | ICD-10-CM

## 2012-07-09 DIAGNOSIS — G4733 Obstructive sleep apnea (adult) (pediatric): Secondary | ICD-10-CM | POA: Diagnosis present

## 2012-07-09 DIAGNOSIS — F329 Major depressive disorder, single episode, unspecified: Secondary | ICD-10-CM | POA: Diagnosis present

## 2012-07-09 DIAGNOSIS — K219 Gastro-esophageal reflux disease without esophagitis: Secondary | ICD-10-CM | POA: Diagnosis present

## 2012-07-09 DIAGNOSIS — Z87891 Personal history of nicotine dependence: Secondary | ICD-10-CM

## 2012-07-09 DIAGNOSIS — M1710 Unilateral primary osteoarthritis, unspecified knee: Secondary | ICD-10-CM | POA: Diagnosis present

## 2012-07-09 DIAGNOSIS — I10 Essential (primary) hypertension: Secondary | ICD-10-CM | POA: Diagnosis present

## 2012-07-09 DIAGNOSIS — H5440 Blindness, one eye, unspecified eye: Secondary | ICD-10-CM | POA: Diagnosis present

## 2012-07-09 DIAGNOSIS — Z79899 Other long term (current) drug therapy: Secondary | ICD-10-CM

## 2012-07-09 DIAGNOSIS — E1142 Type 2 diabetes mellitus with diabetic polyneuropathy: Secondary | ICD-10-CM | POA: Diagnosis present

## 2012-07-09 DIAGNOSIS — F3289 Other specified depressive episodes: Secondary | ICD-10-CM | POA: Diagnosis present

## 2012-07-09 DIAGNOSIS — M952 Other acquired deformity of head: Principal | ICD-10-CM | POA: Diagnosis present

## 2012-07-09 DIAGNOSIS — Z6841 Body Mass Index (BMI) 40.0 and over, adult: Secondary | ICD-10-CM

## 2012-07-09 DIAGNOSIS — E1149 Type 2 diabetes mellitus with other diabetic neurological complication: Secondary | ICD-10-CM | POA: Diagnosis present

## 2012-07-09 DIAGNOSIS — E039 Hypothyroidism, unspecified: Secondary | ICD-10-CM | POA: Diagnosis present

## 2012-07-09 DIAGNOSIS — J45909 Unspecified asthma, uncomplicated: Secondary | ICD-10-CM | POA: Diagnosis present

## 2012-07-09 DIAGNOSIS — F411 Generalized anxiety disorder: Secondary | ICD-10-CM | POA: Diagnosis present

## 2012-07-09 HISTORY — DX: Osteoarthritis of knee, unspecified: M17.9

## 2012-07-09 HISTORY — DX: Headache: R51

## 2012-07-09 HISTORY — DX: Unilateral primary osteoarthritis, unspecified knee: M17.10

## 2012-07-09 HISTORY — DX: Type 2 diabetes mellitus with other specified complication: E11.69

## 2012-07-09 HISTORY — DX: Blindness, one eye, unspecified eye: H54.40

## 2012-07-09 HISTORY — PX: CRANIOPLASTY: SHX1407

## 2012-07-09 HISTORY — DX: Headache, unspecified: R51.9

## 2012-07-09 LAB — GLUCOSE, CAPILLARY
Glucose-Capillary: 189 mg/dL — ABNORMAL HIGH (ref 70–99)
Glucose-Capillary: 130 mg/dL — ABNORMAL HIGH (ref 70–99)
Glucose-Capillary: 145 mg/dL — ABNORMAL HIGH (ref 70–99)

## 2012-07-09 SURGERY — CRANIOPLASTY
Anesthesia: General | Laterality: Right | Wound class: Clean

## 2012-07-09 MED ORDER — MIDAZOLAM HCL 2 MG/2ML IJ SOLN
INTRAMUSCULAR | Status: AC
  Filled 2012-07-09: qty 2

## 2012-07-09 MED ORDER — SODIUM CHLORIDE 0.9 % IV SOLN
500.0000 mg | INTRAVENOUS | Status: DC | PRN
  Administered 2012-07-09: 500 mg via INTRAVENOUS

## 2012-07-09 MED ORDER — PROMETHAZINE HCL 25 MG PO TABS: 12.5000 mg | ORAL_TABLET | ORAL | Status: DC | PRN

## 2012-07-09 MED ORDER — HYDROMORPHONE HCL PF 1 MG/ML IJ SOLN
0.5000 mg | Freq: Once | INTRAMUSCULAR | Status: AC
  Administered 2012-07-09: 0.5 mg via INTRAVENOUS

## 2012-07-09 MED ORDER — ACETAMINOPHEN 10 MG/ML IV SOLN
1000.0000 mg | Freq: Four times a day (QID) | INTRAVENOUS | Status: AC
  Administered 2012-07-09 – 2012-07-10 (×4): 1000 mg via INTRAVENOUS
  Filled 2012-07-09 (×4): qty 100

## 2012-07-09 MED ORDER — PROPOFOL 10 MG/ML IV BOLUS
INTRAVENOUS | Status: DC | PRN
  Administered 2012-07-09: 200 mg via INTRAVENOUS

## 2012-07-09 MED ORDER — DEXAMETHASONE SODIUM PHOSPHATE 10 MG/ML IJ SOLN
INTRAMUSCULAR | Status: DC | PRN
  Administered 2012-07-09: 10 mg via INTRAVENOUS

## 2012-07-09 MED ORDER — SENNA 8.6 MG PO TABS
1.0000 | ORAL_TABLET | Freq: Two times a day (BID) | ORAL | Status: DC
  Administered 2012-07-09 – 2012-07-16 (×13): 8.6 mg via ORAL
  Filled 2012-07-09 (×16): qty 1

## 2012-07-09 MED ORDER — THROMBIN 20000 UNITS EX SOLR
CUTANEOUS | Status: DC | PRN
  Administered 2012-07-09: 13:00:00 via TOPICAL

## 2012-07-09 MED ORDER — GLYCOPYRROLATE 0.2 MG/ML IJ SOLN
INTRAMUSCULAR | Status: DC | PRN
  Administered 2012-07-09: 0.6 mg via INTRAVENOUS

## 2012-07-09 MED ORDER — HYDROMORPHONE HCL PF 1 MG/ML IJ SOLN
INTRAMUSCULAR | Status: AC
  Filled 2012-07-09: qty 1

## 2012-07-09 MED ORDER — HYDROMORPHONE HCL PF 1 MG/ML IJ SOLN
0.2500 mg | INTRAMUSCULAR | Status: DC | PRN
  Administered 2012-07-09 (×4): 0.5 mg via INTRAVENOUS

## 2012-07-09 MED ORDER — MORPHINE SULFATE 2 MG/ML IJ SOLN: 1.0000 mg | INTRAMUSCULAR | Status: DC | PRN

## 2012-07-09 MED ORDER — ONDANSETRON HCL 4 MG/2ML IJ SOLN
4.0000 mg | INTRAMUSCULAR | Status: DC | PRN
  Filled 2012-07-09: qty 2

## 2012-07-09 MED ORDER — MAGNESIUM CITRATE PO SOLN
1.0000 | Freq: Once | ORAL | Status: AC | PRN
  Filled 2012-07-09: qty 296

## 2012-07-09 MED ORDER — LEVOTHYROXINE SODIUM 50 MCG PO TABS
50.0000 ug | ORAL_TABLET | Freq: Every day | ORAL | Status: DC
  Administered 2012-07-10 – 2012-07-16 (×7): 50 ug via ORAL
  Filled 2012-07-09 (×8): qty 1

## 2012-07-09 MED ORDER — ONDANSETRON HCL 4 MG PO TABS
4.0000 mg | ORAL_TABLET | ORAL | Status: DC | PRN
  Administered 2012-07-15: 4 mg via ORAL
  Filled 2012-07-09: qty 1

## 2012-07-09 MED ORDER — DULOXETINE HCL 60 MG PO CPEP
60.0000 mg | ORAL_CAPSULE | Freq: Every day | ORAL | Status: DC
  Administered 2012-07-10 – 2012-07-16 (×7): 60 mg via ORAL
  Filled 2012-07-09 (×8): qty 1

## 2012-07-09 MED ORDER — NALOXONE HCL 0.4 MG/ML IJ SOLN: 0.0800 mg | INTRAMUSCULAR | Status: DC | PRN

## 2012-07-09 MED ORDER — SENNOSIDES-DOCUSATE SODIUM 8.6-50 MG PO TABS
1.0000 | ORAL_TABLET | Freq: Every evening | ORAL | Status: DC | PRN
  Filled 2012-07-09: qty 1

## 2012-07-09 MED ORDER — INDOCYANINE GREEN 25 MG IV SOLR
75.0000 mg | Freq: Once | INTRAVENOUS | Status: AC
  Filled 2012-07-09: qty 75

## 2012-07-09 MED ORDER — HYDROMORPHONE HCL PF 1 MG/ML IJ SOLN
INTRAMUSCULAR | Status: AC
  Administered 2012-07-09: 1 mg
  Filled 2012-07-09: qty 1

## 2012-07-09 MED ORDER — KETOROLAC TROMETHAMINE 30 MG/ML IJ SOLN
30.0000 mg | Freq: Four times a day (QID) | INTRAMUSCULAR | Status: AC
  Administered 2012-07-09 – 2012-07-11 (×7): 30 mg via INTRAVENOUS
  Filled 2012-07-09 (×7): qty 1

## 2012-07-09 MED ORDER — ALBUTEROL SULFATE HFA 108 (90 BASE) MCG/ACT IN AERS
2.0000 | INHALATION_SPRAY | Freq: Four times a day (QID) | RESPIRATORY_TRACT | Status: DC | PRN
  Filled 2012-07-09: qty 6.7

## 2012-07-09 MED ORDER — MIDAZOLAM HCL 5 MG/5ML IJ SOLN
INTRAMUSCULAR | Status: DC | PRN
  Administered 2012-07-09: 1 mg via INTRAVENOUS
  Administered 2012-07-09: 2 mg via INTRAVENOUS
  Administered 2012-07-09: 1 mg via INTRAVENOUS

## 2012-07-09 MED ORDER — DEXAMETHASONE 4 MG PO TABS
4.0000 mg | ORAL_TABLET | Freq: Four times a day (QID) | ORAL | Status: DC
  Administered 2012-07-09 – 2012-07-11 (×7): 4 mg via ORAL
  Filled 2012-07-09 (×11): qty 1

## 2012-07-09 MED ORDER — INSULIN ASPART 100 UNIT/ML ~~LOC~~ SOLN
0.0000 [IU] | Freq: Three times a day (TID) | SUBCUTANEOUS | Status: DC
  Administered 2012-07-10: 7 [IU] via SUBCUTANEOUS
  Administered 2012-07-10 (×2): 4 [IU] via SUBCUTANEOUS
  Administered 2012-07-11: 3 [IU] via SUBCUTANEOUS
  Administered 2012-07-11: 7 [IU] via SUBCUTANEOUS
  Administered 2012-07-11 – 2012-07-12 (×2): 4 [IU] via SUBCUTANEOUS
  Administered 2012-07-12 (×2): 7 [IU] via SUBCUTANEOUS
  Administered 2012-07-13 (×2): 4 [IU] via SUBCUTANEOUS
  Administered 2012-07-13: 17:00:00 via SUBCUTANEOUS
  Administered 2012-07-14 (×2): 4 [IU] via SUBCUTANEOUS
  Administered 2012-07-14 – 2012-07-15 (×3): 3 [IU] via SUBCUTANEOUS
  Administered 2012-07-16: 4 [IU] via SUBCUTANEOUS
  Administered 2012-07-16: 3 [IU] via SUBCUTANEOUS
  Administered 2012-07-16: 4 [IU] via SUBCUTANEOUS

## 2012-07-09 MED ORDER — INSULIN ASPART 100 UNIT/ML ~~LOC~~ SOLN
0.0000 [IU] | Freq: Every day | SUBCUTANEOUS | Status: DC
  Administered 2012-07-09 – 2012-07-11 (×2): 2 [IU] via SUBCUTANEOUS
  Administered 2012-07-12: 4 [IU] via SUBCUTANEOUS
  Administered 2012-07-13 – 2012-07-15 (×2): 2 [IU] via SUBCUTANEOUS

## 2012-07-09 MED ORDER — ONDANSETRON HCL 4 MG/2ML IJ SOLN
INTRAMUSCULAR | Status: DC | PRN
  Administered 2012-07-09: 4 mg via INTRAVENOUS

## 2012-07-09 MED ORDER — LISINOPRIL 10 MG PO TABS
10.0000 mg | ORAL_TABLET | Freq: Every day | ORAL | Status: DC
  Administered 2012-07-10 – 2012-07-16 (×7): 10 mg via ORAL
  Filled 2012-07-09 (×7): qty 1

## 2012-07-09 MED ORDER — MANNITOL 25 % IV SOLN
INTRAVENOUS | Status: DC | PRN
  Administered 2012-07-09: 37.5 g via INTRAVENOUS

## 2012-07-09 MED ORDER — PANTOPRAZOLE SODIUM 40 MG IV SOLR
40.0000 mg | Freq: Every day | INTRAVENOUS | Status: DC
  Administered 2012-07-09: 40 mg via INTRAVENOUS
  Filled 2012-07-09 (×2): qty 40

## 2012-07-09 MED ORDER — OXYCODONE HCL 5 MG PO TABS
5.0000 mg | ORAL_TABLET | ORAL | Status: DC | PRN
  Administered 2012-07-09: 10 mg via ORAL
  Administered 2012-07-09: 5 mg via ORAL
  Administered 2012-07-10 – 2012-07-16 (×4): 10 mg via ORAL
  Filled 2012-07-09 (×3): qty 2
  Filled 2012-07-09: qty 1
  Filled 2012-07-09 (×3): qty 2

## 2012-07-09 MED ORDER — OXYCODONE HCL 5 MG/5ML PO SOLN: 5.0000 mg | Freq: Once | ORAL | Status: DC | PRN

## 2012-07-09 MED ORDER — ESCITALOPRAM OXALATE 10 MG PO TABS
10.0000 mg | ORAL_TABLET | Freq: Every day | ORAL | Status: DC
  Administered 2012-07-10 – 2012-07-16 (×7): 10 mg via ORAL
  Filled 2012-07-09 (×8): qty 1

## 2012-07-09 MED ORDER — 0.9 % SODIUM CHLORIDE (POUR BTL) OPTIME
TOPICAL | Status: DC | PRN
  Administered 2012-07-09 (×5): 1000 mL

## 2012-07-09 MED ORDER — LORAZEPAM 1 MG PO TABS
2.0000 mg | ORAL_TABLET | Freq: Two times a day (BID) | ORAL | Status: DC
  Administered 2012-07-09 – 2012-07-16 (×14): 2 mg via ORAL
  Filled 2012-07-09 (×14): qty 2

## 2012-07-09 MED ORDER — FENTANYL CITRATE 0.05 MG/ML IJ SOLN
INTRAMUSCULAR | Status: DC | PRN
  Administered 2012-07-09: 250 ug via INTRAVENOUS
  Administered 2012-07-09: 100 ug via INTRAVENOUS
  Administered 2012-07-09: 50 ug via INTRAVENOUS
  Administered 2012-07-09 (×3): 250 ug via INTRAVENOUS
  Administered 2012-07-09: 100 ug via INTRAVENOUS

## 2012-07-09 MED ORDER — TRAZODONE HCL 100 MG PO TABS
100.0000 mg | ORAL_TABLET | Freq: Every day | ORAL | Status: DC
  Administered 2012-07-09 – 2012-07-15 (×7): 100 mg via ORAL
  Filled 2012-07-09 (×8): qty 1

## 2012-07-09 MED ORDER — MORPHINE SULFATE 2 MG/ML IJ SOLN
2.0000 mg | INTRAMUSCULAR | Status: DC | PRN
  Administered 2012-07-09: 6 mg via INTRAVENOUS
  Administered 2012-07-10 – 2012-07-11 (×2): 4 mg via INTRAVENOUS
  Administered 2012-07-11: 6 mg via INTRAVENOUS
  Filled 2012-07-09 (×2): qty 3
  Filled 2012-07-09 (×2): qty 2

## 2012-07-09 MED ORDER — LEVETIRACETAM 500 MG PO TABS
500.0000 mg | ORAL_TABLET | Freq: Two times a day (BID) | ORAL | Status: DC
  Administered 2012-07-09 – 2012-07-16 (×14): 500 mg via ORAL
  Filled 2012-07-09 (×15): qty 1

## 2012-07-09 MED ORDER — PREGABALIN 50 MG PO CAPS
150.0000 mg | ORAL_CAPSULE | Freq: Every day | ORAL | Status: DC
  Administered 2012-07-10 – 2012-07-16 (×7): 150 mg via ORAL
  Filled 2012-07-09: qty 3
  Filled 2012-07-09: qty 2
  Filled 2012-07-09 (×4): qty 3
  Filled 2012-07-09: qty 2

## 2012-07-09 MED ORDER — MICROFIBRILLAR COLL HEMOSTAT EX PADS
MEDICATED_PAD | CUTANEOUS | Status: DC | PRN
  Administered 2012-07-09: 1 via TOPICAL

## 2012-07-09 MED ORDER — OXYCODONE HCL 5 MG PO TABS: 5.0000 mg | ORAL_TABLET | Freq: Once | ORAL | Status: DC | PRN

## 2012-07-09 MED ORDER — MEDROXYPROGESTERONE ACETATE 150 MG/ML IM SUSP: 150.0000 mg | INTRAMUSCULAR | Status: DC

## 2012-07-09 MED ORDER — SODIUM CHLORIDE 0.9 % IV SOLN
500.0000 mg | INTRAVENOUS | Status: DC
  Filled 2012-07-09: qty 5

## 2012-07-09 MED ORDER — POTASSIUM CHLORIDE IN NACL 20-0.9 MEQ/L-% IV SOLN
INTRAVENOUS | Status: DC
  Administered 2012-07-09 – 2012-07-13 (×5): via INTRAVENOUS
  Filled 2012-07-09 (×16): qty 1000

## 2012-07-09 MED ORDER — PHENYLEPHRINE HCL 10 MG/ML IJ SOLN
INTRAMUSCULAR | Status: DC | PRN
  Administered 2012-07-09: 40 ug via INTRAVENOUS
  Administered 2012-07-09: 80 ug via INTRAVENOUS
  Administered 2012-07-09: 40 ug via INTRAVENOUS

## 2012-07-09 MED ORDER — LABETALOL HCL 5 MG/ML IV SOLN
INTRAVENOUS | Status: DC | PRN
  Administered 2012-07-09: 10 mg via INTRAVENOUS

## 2012-07-09 MED ORDER — ROCURONIUM BROMIDE 100 MG/10ML IV SOLN
INTRAVENOUS | Status: DC | PRN
  Administered 2012-07-09: 30 mg via INTRAVENOUS
  Administered 2012-07-09: 20 mg via INTRAVENOUS
  Administered 2012-07-09: 50 mg via INTRAVENOUS

## 2012-07-09 MED ORDER — NEOSTIGMINE METHYLSULFATE 1 MG/ML IJ SOLN
INTRAMUSCULAR | Status: DC | PRN
  Administered 2012-07-09: 5 mg via INTRAVENOUS

## 2012-07-09 MED ORDER — METFORMIN HCL ER 500 MG PO TB24
500.0000 mg | ORAL_TABLET | Freq: Every day | ORAL | Status: DC
  Administered 2012-07-10 – 2012-07-16 (×7): 500 mg via ORAL
  Filled 2012-07-09 (×9): qty 1

## 2012-07-09 MED ORDER — ALBUMIN HUMAN 5 % IV SOLN
INTRAVENOUS | Status: DC | PRN
  Administered 2012-07-09: 14:00:00 via INTRAVENOUS

## 2012-07-09 MED ORDER — LABETALOL HCL 5 MG/ML IV SOLN: 10.0000 mg | INTRAVENOUS | Status: DC | PRN

## 2012-07-09 MED ORDER — SODIUM CHLORIDE 0.9 % IV SOLN
INTRAVENOUS | Status: DC | PRN
  Administered 2012-07-09 (×2): via INTRAVENOUS

## 2012-07-09 MED ORDER — MIDAZOLAM HCL 2 MG/2ML IJ SOLN
2.0000 mg | Freq: Once | INTRAMUSCULAR | Status: AC
  Administered 2012-07-09: 2 mg via INTRAVENOUS

## 2012-07-09 MED ORDER — KETOROLAC TROMETHAMINE 30 MG/ML IJ SOLN
30.0000 mg | Freq: Once | INTRAMUSCULAR | Status: AC
  Administered 2012-07-09: 30 mg via INTRAVENOUS

## 2012-07-09 MED ORDER — CEFAZOLIN SODIUM-DEXTROSE 2-3 GM-% IV SOLR
2.0000 g | Freq: Three times a day (TID) | INTRAVENOUS | Status: AC
  Administered 2012-07-09 – 2012-07-10 (×2): 2 g via INTRAVENOUS
  Filled 2012-07-09 (×2): qty 50

## 2012-07-09 MED ORDER — TOPIRAMATE 25 MG PO TABS
50.0000 mg | ORAL_TABLET | Freq: Every day | ORAL | Status: DC | PRN
  Filled 2012-07-09: qty 2

## 2012-07-09 MED ORDER — BISACODYL 5 MG PO TBEC
5.0000 mg | DELAYED_RELEASE_TABLET | Freq: Every day | ORAL | Status: DC | PRN
  Filled 2012-07-09: qty 1

## 2012-07-09 MED ORDER — KETOROLAC TROMETHAMINE 30 MG/ML IJ SOLN
INTRAMUSCULAR | Status: AC
  Filled 2012-07-09: qty 1

## 2012-07-09 MED ORDER — SODIUM CHLORIDE 0.9 % IV SOLN
INTRAVENOUS | Status: DC | PRN
  Administered 2012-07-09: 11:00:00 via INTRAVENOUS

## 2012-07-09 SURGICAL SUPPLY — 114 items
BANDAGE GAUZE 4  KLING STR (GAUZE/BANDAGES/DRESSINGS) ×3 IMPLANT
BANDAGE GAUZE ELAST BULKY 4 IN (GAUZE/BANDAGES/DRESSINGS) ×6 IMPLANT
BENZOIN TINCTURE PRP APPL 2/3 (GAUZE/BANDAGES/DRESSINGS) IMPLANT
BIT DRILL WIRE PASS 1.3MM (BIT) IMPLANT
BLADE EYE SICKLE 84 5 BEAV (BLADE) IMPLANT
BLADE SAW GIGLI 16 STRL (MISCELLANEOUS) IMPLANT
BLADE SURG 10 STRL SS (BLADE) ×3 IMPLANT
BLADE SURG ROTATE 9660 (MISCELLANEOUS) ×3 IMPLANT
BLADE ULTRA TIP 2M (BLADE) IMPLANT
BOWL SPATULA (MISCELLANEOUS) IMPLANT
BRUSH SCRUB EZ 1% IODOPHOR (MISCELLANEOUS) IMPLANT
BRUSH SCRUB EZ PLAIN DRY (MISCELLANEOUS) ×3 IMPLANT
BUR ACORN 6.0 PRECISION (BURR) ×3 IMPLANT
BUR ADDG 1.1 (BURR) IMPLANT
BUR MATCHSTICK NEURO 3.0 LAGG (BURR) IMPLANT
BUR ROUTER D-58 CRANI (BURR) ×3 IMPLANT
CANISTER SUCTION 2500CC (MISCELLANEOUS) ×6 IMPLANT
CLIP RANEY DISP (INSTRUMENTS) IMPLANT
CLIP TI MEDIUM 6 (CLIP) IMPLANT
CLOTH BEACON ORANGE TIMEOUT ST (SAFETY) ×3 IMPLANT
CONT SPEC 4OZ CLIKSEAL STRL BL (MISCELLANEOUS) ×3 IMPLANT
CORDS BIPOLAR (ELECTRODE) ×3 IMPLANT
COVER TABLE BACK 60X90 (DRAPES) IMPLANT
DECANTER SPIKE VIAL GLASS SM (MISCELLANEOUS) ×3 IMPLANT
DEPRESSOR TONGUE BLADE STERILE (MISCELLANEOUS) IMPLANT
DERMABOND ADHESIVE PROPEN (GAUZE/BANDAGES/DRESSINGS) ×1
DERMABOND ADVANCED (GAUZE/BANDAGES/DRESSINGS)
DERMABOND ADVANCED .7 DNX12 (GAUZE/BANDAGES/DRESSINGS) IMPLANT
DERMABOND ADVANCED .7 DNX6 (GAUZE/BANDAGES/DRESSINGS) ×2 IMPLANT
DRAIN SNY WOU 7FLT (WOUND CARE) IMPLANT
DRAPE MICROSCOPE LEICA (MISCELLANEOUS) ×3 IMPLANT
DRAPE NEUROLOGICAL W/INCISE (DRAPES) ×3 IMPLANT
DRAPE POUCH INSTRU U-SHP 10X18 (DRAPES) ×3 IMPLANT
DRAPE SURG IRRIG POUCH 19X23 (DRAPES) IMPLANT
DRAPE WARM FLUID 44X44 (DRAPE) ×3 IMPLANT
DRESSING TELFA 8X3 (GAUZE/BANDAGES/DRESSINGS) ×3 IMPLANT
DRILL WIRE PASS 1.3MM (BIT)
DURAPREP 6ML APPLICATOR 50/CS (WOUND CARE) ×6 IMPLANT
ELECT CAUTERY BLADE 6.4 (BLADE) IMPLANT
ELECT REM PT RETURN 9FT ADLT (ELECTROSURGICAL) ×3
ELECTRODE REM PT RTRN 9FT ADLT (ELECTROSURGICAL) ×2 IMPLANT
EVACUATOR SILICONE 100CC (DRAIN) IMPLANT
GAUZE SPONGE 4X4 16PLY XRAY LF (GAUZE/BANDAGES/DRESSINGS) IMPLANT
GLOVE BIO SURGEON STRL SZ 6.5 (GLOVE) IMPLANT
GLOVE BIO SURGEON STRL SZ7 (GLOVE) IMPLANT
GLOVE BIO SURGEON STRL SZ7.5 (GLOVE) IMPLANT
GLOVE BIO SURGEON STRL SZ8 (GLOVE) IMPLANT
GLOVE BIO SURGEON STRL SZ8.5 (GLOVE) IMPLANT
GLOVE BIOGEL M 8.0 STRL (GLOVE) IMPLANT
GLOVE BIOGEL PI IND STRL 7.0 (GLOVE) ×2 IMPLANT
GLOVE BIOGEL PI IND STRL 7.5 (GLOVE) ×4 IMPLANT
GLOVE BIOGEL PI INDICATOR 7.0 (GLOVE) ×1
GLOVE BIOGEL PI INDICATOR 7.5 (GLOVE) ×2
GLOVE ECLIPSE 6.5 STRL STRAW (GLOVE) IMPLANT
GLOVE ECLIPSE 7.0 STRL STRAW (GLOVE) IMPLANT
GLOVE ECLIPSE 7.5 STRL STRAW (GLOVE) IMPLANT
GLOVE ECLIPSE 8.0 STRL XLNG CF (GLOVE) IMPLANT
GLOVE ECLIPSE 8.5 STRL (GLOVE) IMPLANT
GLOVE EXAM NITRILE LRG STRL (GLOVE) IMPLANT
GLOVE EXAM NITRILE MD LF STRL (GLOVE) IMPLANT
GLOVE EXAM NITRILE XL STR (GLOVE) IMPLANT
GLOVE EXAM NITRILE XS STR PU (GLOVE) IMPLANT
GLOVE INDICATOR 6.5 STRL GRN (GLOVE) IMPLANT
GLOVE INDICATOR 7.0 STRL GRN (GLOVE) IMPLANT
GLOVE INDICATOR 7.5 STRL GRN (GLOVE) IMPLANT
GLOVE INDICATOR 8.0 STRL GRN (GLOVE) IMPLANT
GLOVE INDICATOR 8.5 STRL (GLOVE) IMPLANT
GLOVE OPTIFIT SS 8.0 STRL (GLOVE) IMPLANT
GLOVE SURG SS PI 6.5 STRL IVOR (GLOVE) ×6 IMPLANT
GLOVE SURG SS PI 7.0 STRL IVOR (GLOVE) ×18 IMPLANT
GLOVE SURG SS PI 7.5 STRL IVOR (GLOVE) ×3 IMPLANT
GOWN BRE IMP SLV AUR LG STRL (GOWN DISPOSABLE) ×6 IMPLANT
GOWN BRE IMP SLV AUR XL STRL (GOWN DISPOSABLE) ×9 IMPLANT
GOWN STRL REIN 2XL LVL4 (GOWN DISPOSABLE) IMPLANT
HEMOSTAT SURGICEL 2X14 (HEMOSTASIS) ×3 IMPLANT
HOOK DURA (MISCELLANEOUS) ×3 IMPLANT
KIT BASIN OR (CUSTOM PROCEDURE TRAY) ×3 IMPLANT
KIT DRAIN CSF ACCUDRAIN (MISCELLANEOUS) IMPLANT
KIT ROOM TURNOVER OR (KITS) ×3 IMPLANT
LIDOCAINE 0.5% W/EPI 1:200,000 IMPLANT
NEEDLE HYPO 25X1 1.5 SAFETY (NEEDLE) ×3 IMPLANT
NS IRRIG 1000ML POUR BTL (IV SOLUTION) ×15 IMPLANT
PACK CRANIOTOMY (CUSTOM PROCEDURE TRAY) ×3 IMPLANT
PAD ARMBOARD 7.5X6 YLW CONV (MISCELLANEOUS) ×12 IMPLANT
PATTIES SURGICAL .25X.25 (GAUZE/BANDAGES/DRESSINGS) IMPLANT
PATTIES SURGICAL .5 X.5 (GAUZE/BANDAGES/DRESSINGS) IMPLANT
PATTIES SURGICAL .5 X3 (DISPOSABLE) IMPLANT
PATTIES SURGICAL 1/4 X 3 (GAUZE/BANDAGES/DRESSINGS) IMPLANT
PATTIES SURGICAL 1X1 (DISPOSABLE) IMPLANT
PIN MAYFIELD SKULL DISP (PIN) ×3 IMPLANT
PLATE 1.5  2HOLE MED NEURO (Plate) ×2 IMPLANT
PLATE 1.5 2HOLE MED NEURO (Plate) ×4 IMPLANT
PLATE 1.5/0.5 13MM BURR HOLE (Plate) ×6 IMPLANT
RUBBERBAND STERILE (MISCELLANEOUS) ×6 IMPLANT
SCREW SELF DRILL HT 1.5/4MM (Screw) ×36 IMPLANT
SET CRAINOPLASTY (SET/KITS/TRAYS/PACK) IMPLANT
SPONGE GAUZE 4X4 12PLY (GAUZE/BANDAGES/DRESSINGS) ×3 IMPLANT
SPONGE NEURO XRAY DETECT 1X3 (DISPOSABLE) IMPLANT
SPONGE SURGIFOAM ABS GEL 100 (HEMOSTASIS) IMPLANT
SPONGE SURGIFOAM ABS GEL 100C (HEMOSTASIS) ×3 IMPLANT
STAPLER SKIN PROX WIDE 3.9 (STAPLE) ×3 IMPLANT
SUT ETHILON 3 0 FSL (SUTURE) ×6 IMPLANT
SUT NURALON 4 0 TR CR/8 (SUTURE) ×6 IMPLANT
SUT VIC AB 2-0 CP2 18 (SUTURE) ×6 IMPLANT
SUT VIC AB 2-0 CT2 18 VCP726D (SUTURE) ×6 IMPLANT
SUT VIC AB 3-0 SH 8-18 (SUTURE) ×6 IMPLANT
SYR 20ML ECCENTRIC (SYRINGE) ×3 IMPLANT
SYR CONTROL 10ML LL (SYRINGE) ×3 IMPLANT
TOWEL OR 17X24 6PK STRL BLUE (TOWEL DISPOSABLE) ×3 IMPLANT
TOWEL OR 17X26 10 PK STRL BLUE (TOWEL DISPOSABLE) ×6 IMPLANT
TRAY FOLEY BAG SILVER LF 14FR (CATHETERS) ×3 IMPLANT
TRAY FOLEY CATH 14FRSI W/METER (CATHETERS) IMPLANT
UNDERPAD 30X30 INCONTINENT (UNDERPADS AND DIAPERS) IMPLANT
WATER STERILE IRR 1000ML POUR (IV SOLUTION) ×6 IMPLANT

## 2012-07-09 NOTE — Anesthesia Preprocedure Evaluation (Addendum)
Anesthesia Evaluation  Patient identified by MRN, date of birth, ID band Patient awake    Reviewed: Allergy & Precautions, H&P , NPO status , Patient's Chart, lab work & pertinent test results  Airway Mallampati: III TM Distance: >3 FB Neck ROM: Full  Mouth opening: Limited Mouth Opening  Dental no notable dental hx. (+) Teeth Intact and Dental Advisory Given   Pulmonary asthma , sleep apnea ,  breath sounds clear to auscultation  Pulmonary exam normal       Cardiovascular hypertension, Pt. on medications Rhythm:Regular Rate:Normal     Neuro/Psych  Headaches, PSYCHIATRIC DISORDERS Anxiety Depression    GI/Hepatic Neg liver ROS, GERD-  ,  Endo/Other  diabetes, Well Controlled, Type 2, Oral Hypoglycemic AgentsHypothyroidism Morbid obesity  Renal/GU negative Renal ROS  negative genitourinary   Musculoskeletal   Abdominal   Peds  Hematology negative hematology ROS (+)   Anesthesia Other Findings   Reproductive/Obstetrics negative OB ROS                          Anesthesia Physical Anesthesia Plan  ASA: III  Anesthesia Plan: General   Post-op Pain Management:    Induction: Intravenous  Airway Management Planned: Oral ETT and Video Laryngoscope Planned  Additional Equipment:   Intra-op Plan:   Post-operative Plan: Extubation in OR  Informed Consent: I have reviewed the patients History and Physical, chart, labs and discussed the procedure including the risks, benefits and alternatives for the proposed anesthesia with the patient or authorized representative who has indicated his/her understanding and acceptance.   Dental advisory given  Plan Discussed with: CRNA  Anesthesia Plan Comments:         Anesthesia Quick Evaluation

## 2012-07-09 NOTE — Anesthesia Postprocedure Evaluation (Signed)
Anesthesia Post Note  Patient: Olivia Payne  Procedure(s) Performed: Procedure(s) (LRB): CRANIOPLASTY (Right)  Anesthesia type: General  Patient location: PACU  Post pain: Pain level controlled and Adequate analgesia  Post assessment: Post-op Vital signs reviewed, Patient's Cardiovascular Status Stable, Respiratory Function Stable, Patent Airway and Pain level controlled  Last Vitals:  Filed Vitals:   07/09/12 1519  BP:   Pulse: 73  Temp: 36.7 C  Resp: 14    Post vital signs: Reviewed and stable  Level of consciousness: awake, alert  and oriented  Complications: No apparent anesthesia complications

## 2012-07-09 NOTE — Preoperative (Signed)
Beta Blockers   Reason not to administer Beta Blockers:Not Applicable 

## 2012-07-09 NOTE — Transfer of Care (Signed)
Immediate Anesthesia Transfer of Care Note  Patient: Olivia Payne  Procedure(s) Performed: Procedure(s) with comments: CRANIOPLASTY (Right) - Cranioplasty with bone retrieval from abdominal pocket  Patient Location: PACU  Anesthesia Type:General  Level of Consciousness: awake, sedated and patient cooperative  Airway & Oxygen Therapy: Patient Spontanous Breathing, Patient connected to nasal cannula oxygen and Patient connected to face mask oxygen  Post-op Assessment: Report given to PACU RN, Post -op Vital signs reviewed and stable and Patient moving all extremities  Post vital signs: Reviewed and stable  Complications: No apparent anesthesia complications

## 2012-07-09 NOTE — Anesthesia Procedure Notes (Signed)
Date/Time: 07/09/2012 12:06 PM Performed by: Coralee Rud Pre-anesthesia Checklist: Patient identified, Emergency Drugs available, Suction available, Patient being monitored and Timeout performed Patient Re-evaluated:Patient Re-evaluated prior to inductionOxygen Delivery Method: Circle system utilized Preoxygenation: Pre-oxygenation with 100% oxygen Intubation Type: IV induction Ventilation: Mask ventilation without difficulty and Oral airway inserted - appropriate to patient size Laryngoscope size: Elective glidescope; painful Rt. TMJ. Grade View: Grade I Tube type: Oral Tube size: 7.5 mm Number of attempts: 1 (Elective Glidescope) Airway Equipment and Method: Stylet,  Bite block and Video-laryngoscopy Placement Confirmation: ETT inserted through vocal cords under direct vision and positive ETCO2 Secured at: 22.5 cm Tube secured with: Tape Dental Injury: Teeth and Oropharynx as per pre-operative assessment

## 2012-07-09 NOTE — H&P (Signed)
BP 122/84  Pulse 114  Temp(Src) 98.2 F (36.8 C) (Oral)  Resp 20  SpO2 97% NG:[id number] defect Olivia Payne underwent a craniotomy for avm resection about one month ago. She had the craniotomy flap placed in her adominal wall. A repeat angiogram showed complete resection of the avm. She is admitted today for cranioplasty.  Allergies  Allergen Reactions  . Latex Other (See Comments)    "SEVERE REACTION"  When received flu shot.   Prior to Admission medications   Medication Sig Start Date End Date Taking? Authorizing Provider  albuterol (PROVENTIL HFA;VENTOLIN HFA) 108 (90 BASE) MCG/ACT inhaler Inhale 2 puffs into the lungs every 6 (six) hours as needed for wheezing.   Yes Historical Provider, MD  DULoxetine (CYMBALTA) 30 MG capsule Take 60 mg by mouth daily.    Yes Historical Provider, MD  escitalopram (LEXAPRO) 10 MG tablet Take 10 mg by mouth daily.   Yes Historical Provider, MD  HYDROcodone-acetaminophen (NORCO/VICODIN) 5-325 MG per tablet Take 1 tablet by mouth every 6 (six) hours as needed for pain. 06/02/12  Yes Carmela Hurt, MD  levETIRAcetam (KEPPRA) 500 MG tablet Take 1 tablet (500 mg total) by mouth 2 (two) times daily. 06/02/12  Yes Carmela Hurt, MD  levothyroxine (SYNTHROID, LEVOTHROID) 50 MCG tablet Take 50 mcg by mouth daily.  05/21/11  Yes Historical Provider, MD  lisinopril (PRINIVIL,ZESTRIL) 10 MG tablet Take 10 mg by mouth daily.   Yes Historical Provider, MD  LORazepam (ATIVAN) 1 MG tablet Take 2 mg by mouth 2 (two) times daily.    Yes Historical Provider, MD  medroxyPROGESTERone (DEPO-PROVERA) 150 MG/ML injection Inject 150 mg into the muscle every 3 (three) months. Last dose administered 01/28/2012.   Yes Historical Provider, MD  metFORMIN (GLUCOPHAGE-XR) 500 MG 24 hr tablet Take 1 tablet (500 mg total) by mouth daily with breakfast. 08/26/11 08/25/12 Yes Romero Belling, MD  oxyCODONE-acetaminophen (PERCOCET/ROXICET) 5-325 MG per tablet Take 1 tablet by mouth every 4 (four)  hours as needed for pain. Take 2 and then hr later takes another one   Yes Historical Provider, MD  pregabalin (LYRICA) 75 MG capsule Take 150 mg by mouth daily as needed. For nerve pain   Yes Historical Provider, MD  topiramate (TOPAMAX) 50 MG tablet Take 50 mg by mouth daily as needed. For migraines   Yes Historical Provider, MD  traMADol (ULTRAM) 50 MG tablet Take 50 mg by mouth every 6 (six) hours as needed for pain. Takes 3 at a time prn   Yes Historical Provider, MD  traZODone (DESYREL) 50 MG tablet Take 100 mg by mouth at bedtime. For sleep   Yes Historical Provider, MD   Past Surgical History  Procedure Laterality Date  . Dilation and curettage of uterus  06/07/2002  . Cesarean section  1995  . Cerebral arteriogram    . Radiology with anesthesia N/A 04/20/2012    Procedure: RADIOLOGY WITH ANESTHESIA;  Surgeon: Oneal Grout, MD;  Location: [facility];  Service: Radiology;  Laterality: N/A;  . Radiology with anesthesia N/A 05/26/2012    Procedure: RADIOLOGY WITH ANESTHESIA;  Surgeon: Oneal Grout, MD;  Location: [facility] OR;  Service: Radiology;  Laterality: N/A;  . Craniotomy Right 05/26/2012    Procedure: CRANIECTOMY INTRACRANIAL ARTERIO-VENOUS MALFORMATION DURAL COMPLEX (AVM). PLACEMENT OF BONE FLAB IN ABDOMINAL WALL;  Surgeon: Carmela Hurt, MD;  Location: [facility];  Service: Neurosurgery;  Laterality: Right;  RIGHT Craniectomy for arteriovenous malformation resection  . Eye surgery  01/12/2013    laser    Past Medical History  Diagnosis Date  . Asthma   . History of chicken pox   . Migraine   . Allergy   . Thyroid disease   . Neuropathy   . Hypertension   . Hypothyroidism   . Anxiety   . Depression   . Cerebral aneurysm   . Cerebral AVM   . Diabetes mellitus without complication     borderline  . Sleep apnea     does not use Cpap  . GERD (gastroesophageal reflux disease)     hx   Family History  Problem Relation Age of Onset  . Cancer Mother     Breast  Cancer  . Diabetes Father   . Heart disease Father   . Heart attack Father 5  . Cancer Paternal Aunt     Ovarian Cancer  . Diabetes Paternal Grandmother   . Cancer Other     Lung Cancer-Maternal side of family   History   Social History  . Marital Status: Married    Spouse Name: N/A    Number of Children: 2  . Years of Education: N/A   Occupational History  . ANALYST [employer]   Social History Main Topics  . Smoking status: Former Smoker -- 1.50 packs/day for 26 years    Types: Cigarettes    Quit date: 07/08/2003  . Smokeless tobacco: Not on file     Comment: quit smoking in 2005  . Alcohol Use: No  . Drug Use: No  . Sexually Active: Not Currently   Other Topics Concern  . Not on file   Social History Narrative   Caffeine Use-no         Physical Exam  Vitals reviewed. Constitutional: She is oriented to person, place, and time. She appears well-developed and well-nourished.  HENT:  Cranial defect on the right  Eyes: Conjunctivae and EOM are normal. Pupils are equal, round, and reactive to light.  Neck: Normal range of motion. Neck supple.  Cardiovascular: Normal rate, regular rhythm and normal heart sounds.   Pulmonary/Chest: Effort normal and breath sounds normal.  Musculoskeletal: Normal range of motion.  Neurological: She is alert and oriented to person, place, and time. She has normal reflexes. She displays normal reflexes. No cranial nerve deficit. She exhibits normal muscle tone. Coordination normal.  Skin: Skin is warm and dry.  Psychiatric: She has a normal mood and affect. Her behavior is normal. Judgment and thought content normal.    A/P Olivia Payne is admitted for a cranoioplasty. Risks including bleeding, infection, need for further surgery, brain damage, weakness, paralysis, stroke,coma, death were explained including others. She understands and wishes to proceed.

## 2012-07-09 NOTE — Progress Notes (Signed)
Patient ID: Olivia Payne, female   DOB: [birth year], 42 y.o.   MRN: [id number] BP 110/78  Pulse 73  Temp(Src) 97.4 F (36.3 C) (Oral)  Resp 16  Ht 5\' 7"  (1.702 m)  Wt 168.3 kg (371 lb 0.6 oz)  BMI 58.1 kg/m2  SpO2 94% Alert and oriented x 4 Speech is clear, fluent Moving all extremities well Ct shows edema in resection site, mild midline shift. On decadron currently.

## 2012-07-09 NOTE — Op Note (Signed)
07/09/2012  8:08 PM  PATIENT:  Olivia Payne  42 y.o. female status post resection of an arteriovenous malformation. She is admitted for cranioplasty.  PRE-OPERATIVE DIAGNOSIS:  arteriovenous malformation, craniotomy defect  POST-OPERATIVE DIAGNOSIS:  arteriovenous malformation, craniotomy defect  PROCEDURE:  Procedure(s): CRANIOPLASTY  SURGEON:  Surgeon(s): Carmela Hurt, MD Clydene Fake, MD  ASSISTANTS:hirsch, Fayrene Fearing  ANESTHESIA:   general  EBL:     BLOOD ADMINISTERED:none  CELL SAVER GIVEN:none  COUNT:per nursing  DRAINS: none   SPECIMEN:  No Specimen  DICTATION: [name] was brought to the operating room, intubated, and placed under a general anesthetic. She was positioned in a semi lateral decubitus position with the right side up. All pressure points were properly padded. She had the abdominal incision prepped and her head shaved then prepped. She was draped in a sterile manner. I opened the abdominal incision with a 10 blade then removed the bone flap from the subcutaneous pocket. I then opened the cranial incision and dissected the scalp from the dura and the temporalis muscle from the bovine pericardium also. The parietal temporal area was still swollen to some degree. With the addition of mannitol and positioning in reverse trendelenburg [name] and I sewed the dura to the bone edge, then replaced the bone flap. This was done with plates and screws made of titanium. I irrigated throughout the case.  We then approximated the scalp and temporalis fascia. I used vicryl sutures to approximate the galea, and nylon to approximated the scalp edges. I applied a sterile dressing. She was extubated moving all extremities.   PLAN OF CARE: Admit to inpatient   PATIENT DISPOSITION:  PACU - hemodynamically stable.   Delay start of Pharmacological VTE agent (>24hrs) due to surgical blood loss or risk of bleeding:  yes

## 2012-07-10 LAB — GLUCOSE, CAPILLARY
Glucose-Capillary: 200 mg/dL — ABNORMAL HIGH (ref 70–99)
Glucose-Capillary: 231 mg/dL — ABNORMAL HIGH (ref 70–99)

## 2012-07-10 MED ORDER — PANTOPRAZOLE SODIUM 40 MG PO TBEC
40.0000 mg | DELAYED_RELEASE_TABLET | Freq: Every day | ORAL | Status: DC
  Administered 2012-07-10 – 2012-07-16 (×7): 40 mg via ORAL
  Filled 2012-07-10 (×8): qty 1

## 2012-07-10 NOTE — Progress Notes (Signed)
PHARMACIST - PHYSICIAN COMMUNICATION DR:   Franky Macho CONCERNING: Protonix IV to Oral Route Change Policy  RECOMMENDATION: This patient is receiving Protonix by the intravenous route.  Based on criteria approved by the Pharmacy and Therapeutics Committee, this drug is being converted to the equivalent oral dose form(s).  DESCRIPTION: These criteria include:  The patient is eating (either orally or via tube) and/or has been taking other orally administered medications for a least 24 hours  There is no active GI bleed or impaired GI absorption noted.   If you have questions about this conversion, please contact the Pharmacy Department  []   [phone] )  [facility] [x]   [phone] )  [facility]  []   [phone] )  [facility] []   [phone] )  [facility]

## 2012-07-10 NOTE — Progress Notes (Signed)
Subjective: Patient reports HA better today  Objective: Vital signs in last 24 hours: Temp:  [97.4 F (36.3 C)-98.4 F (36.9 C)] 98 F (36.7 C) (05/10 0400) Pulse Rate:  [68-114] 72 (05/10 0600) Resp:  [10-21] 16 (05/10 0600) BP: (102-122)/(45-89) 110/78 mmHg (05/09 1800) SpO2:  [93 %-100 %] 98 % (05/10 0600) Arterial Line BP: (117-153)/(58-81) 128/61 mmHg (05/10 0600) Weight:  [168.3 kg (371 lb 0.6 oz)] 168.3 kg (371 lb 0.6 oz) (05/09 1705)  Intake/Output from previous day: 05/09 0701 - 05/10 0700 In: 4322.7 [P.O.:240; I.V.:2682.7; Blood:500; IV Piggyback:900] Out: 1300 [Urine:1100; Blood:200] Intake/Output this shift:   Moves all well  - pt AAOx3 Wound:c/d/i  Lab Results:  Recent Labs  07/07/12 1031  WBC 10.4  HGB 12.8  HCT 38.4  PLT 308   BMET  Recent Labs  07/07/12 1031  NA 138  K 4.5  CL 104  CO2 28  GLUCOSE 203*  BUN 9  CREATININE 0.77  CALCIUM 9.5    Studies/Results: Ct Head Wo Contrast  07/09/2012  *RADIOLOGY REPORT*  Clinical Data: Headache.  Status post cranioplasty.  Resection of AVM approximately 1 month ago.  CT HEAD WITHOUT CONTRAST  Technique:  Contiguous axial images were obtained from the base of the skull through the vertex without contrast.  Comparison: CT head without contrast 05/26/2012.  Findings: The craniotomy flap is in place.  The majority of the right frontal parietal AVM has been resected.  There is some residual intravascular coil material along the inferior aspect of the parietal lobe.  Cortical and subcortical hemorrhage is present in the posterior right frontal lobe with some local mass effect.  Right to left midline shift at the foramen of Monro measures 4-5 mm.  A small amount of subarachnoid blood is noted as well.  No significant left- sided hemorrhage is present.  The left hemisphere is unremarkable.  The paranasal sinuses and mastoid air cells are clear.  IMPRESSION:  1.  Status post right frontal parietal cranioplasty. 2.   Interval resection of the majority of a right frontal and parietal AVM.  There is some residual intravascular coil material along the inferior right parietal and temporal lobe. 3.  Cortical and subcortical hemorrhage adjacent to the resection cavity extending into the white matter.  While this may be postoperative in nature, venous infarction is also considered. 4.  Midline shift of 4-5 mm.  Critical Value/emergent results were called by telephone at the time of interpretation on 07/09/2012 at 07:10 p.m. to [name], who verbally acknowledged these results.   Original Report Authenticated By: Marin Roberts, M.D.    Dg Chest Port 1 View  07/09/2012  *RADIOLOGY REPORT*  Clinical Data: Left central venous line placement  PORTABLE CHEST - 1 VIEW  Comparison: 05/28/2012  Findings:  The study is limited by patient's large body habitus.  Cardiomegaly again noted.  There is a left subclavian central line with tip in the left innominate vein.  No diagnostic pneumothorax.  No acute infiltrate or pulmonary edema.  Impression: 1.The study is limited by patient's large body habitus. Cardiomegaly again noted.  There is a left subclavian central line with tip in the left innominate vein.  No diagnostic pneumothorax. No acute infiltrate or pulmonary edema.   Original Report Authenticated By: Natasha Mead, M.D.     Assessment/Plan: Increase activity, ? Floor in am   LOS: 1 day     Eileen Kangas R, MD 07/10/2012, 7:49 AM

## 2012-07-11 LAB — GLUCOSE, CAPILLARY
Glucose-Capillary: 202 mg/dL — ABNORMAL HIGH (ref 70–99)
Glucose-Capillary: 193 mg/dL — ABNORMAL HIGH (ref 70–99)
Glucose-Capillary: 207 mg/dL — ABNORMAL HIGH (ref 70–99)

## 2012-07-11 MED ORDER — DEXAMETHASONE 2 MG PO TABS
2.0000 mg | ORAL_TABLET | Freq: Four times a day (QID) | ORAL | Status: DC
  Administered 2012-07-11 – 2012-07-12 (×4): 2 mg via ORAL
  Filled 2012-07-11 (×7): qty 1

## 2012-07-11 MED ORDER — KETOROLAC TROMETHAMINE 10 MG PO TABS
10.0000 mg | ORAL_TABLET | Freq: Three times a day (TID) | ORAL | Status: DC | PRN
  Administered 2012-07-12 – 2012-07-15 (×7): 10 mg via ORAL
  Filled 2012-07-11 (×9): qty 1

## 2012-07-11 MED ORDER — FLUCONAZOLE 150 MG PO TABS
150.0000 mg | ORAL_TABLET | Freq: Once | ORAL | Status: AC
  Administered 2012-07-11: 150 mg via ORAL
  Filled 2012-07-11: qty 1

## 2012-07-11 MED ORDER — PROCHLORPERAZINE 25 MG RE SUPP
25.0000 mg | Freq: Two times a day (BID) | RECTAL | Status: DC | PRN
  Filled 2012-07-11: qty 1

## 2012-07-11 NOTE — Evaluation (Signed)
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 Mobility on eval hindered by moderate weakness, decr balance and decr activity tolerance.  Pt can benefit from PT to maximize function and I.    PT Assessment  Patient needs continued PT services    Follow Up Recommendations  Home health PT;Other (comment) (vs SNF if husband or family can not stay home with her)    Does the patient have the potential to tolerate intense rehabilitation      Barriers to Discharge Decreased caregiver support (once husband has to go back to work in 2 weeks)      Equipment Recommendations  Other (comment) (TBA before D/C)    Recommendations for Other Services     Frequency Min 3X/week    Precautions / Restrictions Precautions Precautions: Fall Restrictions Weight Bearing Restrictions: No   Pertinent Vitals/Pain       Mobility  Bed Mobility Bed Mobility: Supine to Sit;Sitting - Scoot to [place] of Bed;Sit to Supine Supine to Sit: 4: Min assist;HOB flat Sitting - Scoot to [place] of Bed: 5: Supervision Sit to Supine: 5: Supervision Details for Bed Mobility Assistance: safe technique Transfers Transfers: Sit to Stand;Stand to Sit Sit to Stand: 4: Min assist;With upper extremity assist;From bed (+2 total pt =60 from toilet) Stand to Sit: 4: Min assist;With upper extremity assist;To bed;To toilet Details for Transfer Assistance: vc's for hand placement; steadying and lifting assist Ambulation/Gait Ambulation/Gait Assistance: 1: +2 Total assist Ambulation/Gait: Patient Percentage: 70% Ambulation Distance (Feet): 80 Feet Assistive device: 2 person hand held assist Ambulation/Gait Assistance Details: unsteady gait, occ. sinking squats before recovering.  Tremulous when not  actually moving Gait Pattern: Step-through pattern Gait velocity: slow    Exercises     PT Diagnosis: Difficulty walking;Generalized weakness  PT Problem List: Decreased strength;Decreased activity tolerance;Decreased balance;Decreased mobility;Decreased coordination;Obesity;Pain PT Treatment Interventions: DME instruction;Gait training;Stair training;Functional mobility training;Therapeutic activities;Balance training;Patient/family education   PT Goals Acute Rehab PT Goals PT Goal Formulation: With patient/family Time For Goal Achievement: 07/25/12 Potential to Achieve Goals: Good Pt will go Supine/Side to Sit: with modified independence;with HOB 0 degrees PT Goal: Supine/Side to Sit - Progress: Goal set today Pt will go Sit to Supine/Side: with modified independence;with HOB 0 degrees PT Goal: Sit to Supine/Side - Progress: Goal set today Pt will go Sit to Stand: with supervision;with upper extremity assist PT Goal: Sit to Stand - Progress: Goal set today Pt will Ambulate: >150 feet;with supervision PT Goal: Ambulate - Progress: Goal set today Pt will Go Up / Down Stairs: 6-9 stairs;with supervision;with rail(s) PT Goal: Up/Down Stairs - Progress: Goal set today  Visit Information  Last PT Received On: 07/11/12 Assistance Needed: +2    Subjective Data  Subjective: I've got such a bad HA Patient Stated Goal: Get home   Prior Functioning  Home Living Lives With: Spouse Available Help at Discharge: Family;Available 24 hours/day (for 2 more weeks then husband must return to work) Type of Home: Mobile home (double -wide) Home Access: Stairs to enter [employer] of Steps: 8 Entrance Stairs-Rails: Can reach both Home Layout: One level Bathroom Shower/Tub: Health visitor: Standard Bathroom Accessibility: Yes How Accessible: Accessible via walker Home Adaptive Equipment: Bedside commode/3-in-1;Feeding equipment Prior Function Level of  Independence: Independent Able to Take Stairs?: Yes Driving: No  Vocation: Full time employment Communication Communication: No difficulties Dominant Hand: Right    Cognition  Cognition Arousal/Alertness: Awake/alert Behavior During Therapy: WFL for tasks assessed/performed Overall Cognitive Status: Within Functional Limits for tasks assessed    Extremity/Trunk Assessment Right Upper Extremity Assessment RUE ROM/Strength/Tone: [facility] for tasks assessed Left Upper Extremity Assessment LUE ROM/Strength/Tone: WFL for tasks assessed Right Lower Extremity Assessment RLE ROM/Strength/Tone: WFL for tasks assessed Left Lower Extremity Assessment LLE ROM/Strength/Tone: WFL for tasks assessed (Bilaterally weak legs) Trunk Assessment Trunk Assessment: Normal   Balance Static Sitting Balance Static Sitting - Balance Support: Feet supported;No upper extremity supported Static Sitting - Level of Assistance: 5: Stand by assistance  End of Session PT - End of Session Activity Tolerance: Patient tolerated treatment well Patient left: in bed;with call bell/phone within reach;with family/visitor present Nurse Communication: Mobility status  GP     Thara Searing, Eliseo Gum 07/11/2012, 4:52 PM 07/11/2012  [place], PT [phone]  (pager)

## 2012-07-11 NOTE — Progress Notes (Signed)
Doing well. No C/o.  Was up OOB yest.  Temp:  [97.4 F (36.3 C)-99 F (37.2 C)] 97.9 F (36.6 C) (05/11 0400) Pulse Rate:  [73-106] 79 (05/11 0700) Resp:  [13-28] 28 (05/11 0700) BP: (95-137)/(46-76) 134/76 mmHg (05/11 0700) SpO2:  [95 %-100 %] 98 % (05/11 0700) Weight:  [172.6 kg (380 lb 8.2 oz)] 172.6 kg (380 lb 8.2 oz) (05/11 0400) Good strength and sensation Incision CDI  Plan: Transfer to floor

## 2012-07-12 LAB — GLUCOSE, CAPILLARY
Glucose-Capillary: 210 mg/dL — ABNORMAL HIGH (ref 70–99)
Glucose-Capillary: 223 mg/dL — ABNORMAL HIGH (ref 70–99)
Glucose-Capillary: 190 mg/dL — ABNORMAL HIGH (ref 70–99)
Glucose-Capillary: 309 mg/dL — ABNORMAL HIGH (ref 70–99)

## 2012-07-12 LAB — POCT I-STAT 4, (NA,K, GLUC, HGB,HCT)
Glucose, Bld: 131 mg/dL — ABNORMAL HIGH (ref 70–99)
HCT: 29 % — ABNORMAL LOW (ref 36.0–46.0)
Hemoglobin: 9.9 g/dL — ABNORMAL LOW (ref 12.0–15.0)
Potassium: 4.1 meq/L (ref 3.5–5.1)
Sodium: 138 meq/L (ref 135–145)

## 2012-07-12 MED ORDER — TRAMADOL HCL 50 MG PO TABS
50.0000 mg | ORAL_TABLET | Freq: Four times a day (QID) | ORAL | Status: DC | PRN
  Administered 2012-07-12 – 2012-07-14 (×3): 50 mg via ORAL
  Filled 2012-07-12 (×3): qty 1

## 2012-07-12 MED ORDER — SODIUM CHLORIDE 0.9 % IJ SOLN
10.0000 mL | INTRAMUSCULAR | Status: DC | PRN
  Administered 2012-07-12 – 2012-07-14 (×4): 10 mL

## 2012-07-12 MED ORDER — INSULIN DETEMIR 100 UNIT/ML ~~LOC~~ SOLN
15.0000 [IU] | Freq: Every day | SUBCUTANEOUS | Status: DC
  Administered 2012-07-12 – 2012-07-15 (×4): 15 [IU] via SUBCUTANEOUS
  Filled 2012-07-12 (×5): qty 0.15

## 2012-07-12 MED ORDER — DEXAMETHASONE 2 MG PO TABS
2.0000 mg | ORAL_TABLET | Freq: Two times a day (BID) | ORAL | Status: DC
  Administered 2012-07-13 – 2012-07-16 (×7): 2 mg via ORAL
  Filled 2012-07-12 (×9): qty 1

## 2012-07-12 NOTE — Progress Notes (Signed)
Inpatient Diabetes Program Recommendations  AACE/ADA: New Consensus Statement on Inpatient Glycemic Control (2013)  Target Ranges:  Prepandial:   less than 140 mg/dL      Peak postprandial:   less than 180 mg/dL (1-2 hours)      Critically ill patients:  140 - 180 mg/dL   Reason for Visit: Results for Olivia Payne, Olivia Payne (MRN [id number]) as of 07/12/2012 11:17  Ref. Range 07/11/2012 11:10 07/11/2012 16:02 07/11/2012 21:51 07/12/2012 06:27  Glucose-Capillary Latest Range: 70-99 mg/dL 409 (H) 811 (H) 914 (H) 210 (H)   Note CBG's greater than goal.  Pt. is currently on Decadron post surgery.  May consider adding Levemir 20 units q HS while in the hospital to control CBG's while on steroids.  Also please check A1C to determine pre-hospitalization glycemic control.

## 2012-07-12 NOTE — Progress Notes (Signed)
Patient ID: Olivia Payne, female   DOB: Jul 21, 1970, 42 y.o.   MRN: [id number] BP 178/87  Pulse 54  Temp(Src) 98.1 F (36.7 C) (Oral)  Resp 20  Ht 5\' 7"  (1.702 m)  Wt 172.6 kg (380 lb 8.2 oz)  BMI 59.58 kg/m2  SpO2 97% Alert and oriented x 4, moving all extremities Pain in knees bilaterally Await rehab

## 2012-07-12 NOTE — Progress Notes (Signed)
Rehab Admissions Coordinator Note:  Patient was screened by Clois Dupes for appropriateness for an Inpatient Acute Rehab Consult.  At this time, we are recommending Inpatient Rehab consult per OT recommendations. Please order.  Clois Dupes 07/12/2012, 1:26 PM  I can be reached at [phone].

## 2012-07-12 NOTE — Evaluation (Signed)
Occupational Therapy Evaluation Patient Details Name: Olivia Payne MRN: [id number] DOB: November 15, 1970 Today's Date: 07/12/2012 Time: 1478-2956 OT Time Calculation (min): 48 min  OT Assessment / Plan / Recommendation Clinical Impression   This 42 y.o. Female s/p craniotomy for AVM resection ~65month ago, is readmitted for cranioplasty.  She presents to OT with deficits in balance, functional mobility, vision, and pain.  She currently requires +2 assist for functional mobility and mod A overall for BADLs.  Husband is very supportive.  SHe will benefit from continued OT to address the below listed deficits and will benefit from CIR to allow her to return home at supervision level.      OT Assessment  Patient needs continued OT Services    Follow Up Recommendations  CIR;Supervision/Assistance - 24 hour    Barriers to Discharge None    Equipment Recommendations  None recommended by OT    Recommendations for Other Services Rehab consult  Frequency  Min 2X/week    Precautions / Restrictions Precautions Precautions: Fall Restrictions Weight Bearing Restrictions: No   Pertinent Vitals/Pain     ADL  Eating/Feeding: Set up Where Assessed - Eating/Feeding: Chair;Bed level Grooming: Wash/dry hands;Wash/dry face;Teeth care;Set up Where Assessed - Grooming: Supported sitting Upper Body Bathing: Minimal assistance Where Assessed - Upper Body Bathing: Supported sitting;Unsupported sitting Lower Body Bathing: Moderate assistance Where Assessed - Lower Body Bathing: Supported sit to stand Upper Body Dressing: Minimal assistance Where Assessed - Upper Body Dressing: Unsupported sitting Lower Body Dressing: Moderate assistance Where Assessed - Lower Body Dressing: Supported sit to Pharmacist, hospital: +2 Total assistance Toilet Transfer: Patient Percentage: 70% Toilet Transfer Method: Sit to stand;Stand pivot Acupuncturist: Comfort height toilet Toileting - Clothing  Manipulation and Hygiene: Moderate assistance Where Assessed - Toileting Clothing Manipulation and Hygiene: Standing Transfers/Ambulation Related to ADLs: total A +2 HHA (pt ~60-80%) decreases as she fatigues ADL Comments: Pt able to don socks EOB with increased time.  She is noted to overshoot and undershoot with this activity due to visual deficits.      OT Diagnosis: Generalized weakness;Disturbance of vision;Acute pain  OT Problem List: Decreased strength;Decreased activity tolerance;Impaired balance (sitting and/or standing);Impaired vision/perception;Decreased cognition;Decreased knowledge of use of DME or AE;Obesity;Pain OT Treatment Interventions: Self-care/ADL training;DME and/or AE instruction;Therapeutic activities;Visual/perceptual remediation/compensation;Patient/family education;Balance training   OT Goals Acute Rehab OT Goals OT Goal Formulation: With patient/family Time For Goal Achievement: 07/26/12 Potential to Achieve Goals: Good ADL Goals Pt Will Perform Grooming: with supervision;Standing at sink ADL Goal: Grooming - Progress: Goal set today Pt Will Perform Upper Body Bathing: with set-up;with supervision;Sitting at sink;Sitting, edge of bed;Sitting, chair ADL Goal: Upper Body Bathing - Progress: Goal set today Pt Will Perform Lower Body Bathing: with supervision;Sit to stand from chair;Sit to stand from bed ADL Goal: Lower Body Bathing - Progress: Goal set today Pt Will Perform Upper Body Dressing: with supervision;Sitting, chair;Sitting, bed ADL Goal: Upper Body Dressing - Progress: Goal set today Pt Will Perform Lower Body Dressing: with supervision;Sit to stand from chair;Sit to stand from bed ADL Goal: Lower Body Dressing - Progress: Goal set today Pt Will Transfer to Toilet: with supervision;Ambulation;Comfort height toilet;with DME ADL Goal: Toilet Transfer - Progress: Goal set today Pt Will Perform Toileting - Clothing Manipulation: with  supervision;Standing ADL Goal: Toileting - Clothing Manipulation - Progress: Goal set today Pt Will Perform Toileting - Hygiene: with supervision;Standing at 3-in-1/toilet;Sit to stand from 3-in-1/toilet ADL Goal: Toileting - Hygiene - Progress: Goal set today Pt Will Perform Tub/Shower  Transfer: with supervision;with DME;Grab bars ADL Goal: Tub/Shower Transfer - Progress: Goal set today Additional ADL Goal #1: Pt will be able to locate and retrieve items during simple ADL tasks without over or undershooting ADL Goal: Additional Goal #1 - Progress: Goal set today Additional ADL Goal #2: Pt will be able to participate in further visual assesment ADL Goal: Additional Goal #2 - Progress: Goal set today  Visit Information  Last OT Received On: 07/12/12 Assistance Needed: +2 PT/OT Co-Evaluation/Treatment: Yes    Subjective Data  Subjective: "The light hurts my eyes" Patient Stated Goal: To get better"   Prior Functioning     Home Living Lives With: Spouse Available Help at Discharge: Family;Available 24 hours/day (for 2 more weeks then husband must return to work) Type of Home: Mobile home (double -wide) Home Access: Stairs to enter [employer] of Steps: 8 Entrance Stairs-Rails: Can reach both Home Layout: One level Bathroom Shower/Tub: Health visitor: Standard Bathroom Accessibility: Yes How Accessible: Accessible via walker Home Adaptive Equipment: Bedside commode/3-in-1;Feeding equipment Prior Function Level of Independence: Independent;Needs assistance Needs Assistance: Bathing;Dressing;Toileting;Meal Prep;Light Housekeeping;Gait Bath: Moderate Dressing: Moderate Toileting: Minimal Meal Prep: Total Light Housekeeping: Total Able to Take Stairs?: Yes Driving: No Vocation: Full time employment Comments: Prior to initial surgery, pt was fully independent with all BADLs and IADLs Communication Communication: No difficulties (Pt reports  difficulty finding her words) Dominant Hand: Right         Vision/Perception Vision - History Visual History: Brain injury Patient Visual Report: Diplopia;No change from baseline;Blurring of vision;Eye fatigue/eye pain/headache;Overshooting;Undershooting;Unable to keep objects in focus;Nausea/blurring vision with head movement;Central vision impairment Vision - Assessment Vision Assessment: Vision not tested Additional Comments: Pt unable to participate in visual assesment at this time due to headache.  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 Pt reports that she has had diplopia, light sensitivity, decreased actuity since initial crani.  Pt. noted to keep OS closed during activity, and is noted to overshoot and undershoot when reaching for objects and when performing ADLs.  She will benefit from further evaluation as she is able to tolerate. Praxis Praxis: Intact   Cognition  Cognition Arousal/Alertness: Awake/alert Behavior During Therapy: WFL for tasks assessed/performed Overall Cognitive Status: Within Functional Limits for tasks assessed (husband denies changes.  Pt reports memory deficits)    Extremity/Trunk Assessment Right Upper Extremity Assessment RUE ROM/Strength/Tone: WFL for tasks assessed RUE Coordination: WFL - gross/fine motor Left Upper Extremity Assessment LUE ROM/Strength/Tone: WFL for tasks assessed LUE Coordination: WFL - gross/fine motor Trunk Assessment Trunk Assessment: Normal     Mobility Bed Mobility Bed Mobility: Supine to Sit;Sitting - Scoot to Edge of Bed Supine to Sit: 4: Min guard;With rails;HOB flat Sitting - Scoot to Edge of Bed: 5: Supervision Details for Bed Mobility Assistance: safe technique Transfers Transfers: Sit to Stand;Stand to Sit Sit to Stand: 4: Min assist;With upper extremity assist;From bed Stand to Sit: With upper extremity assist;To  chair/3-in-1;4: Min assist Details for Transfer Assistance: vc's for hand placement, steadying assist     Exercise     Balance Balance Balance Assessed: Yes Static Sitting Balance Static Sitting - Balance Support: Feet supported;Bilateral upper extremity supported Static Sitting - Level of Assistance: 4: Min assist   End of Session OT - End of Session Activity Tolerance: Patient tolerated treatment well Patient left: in chair;with call bell/phone within reach;with family/visitor present;with  nursing in room Nurse Communication: Patient requests pain meds;Mobility status  GO     Jeani Hawking M 07/12/2012, 12:51 PM

## 2012-07-13 ENCOUNTER — Encounter (HOSPITAL_COMMUNITY): Payer: Self-pay | Admitting: Neurosurgery

## 2012-07-13 DIAGNOSIS — R269 Unspecified abnormalities of gait and mobility: Secondary | ICD-10-CM

## 2012-07-13 DIAGNOSIS — R5381 Other malaise: Secondary | ICD-10-CM

## 2012-07-13 DIAGNOSIS — Q279 Congenital malformation of peripheral vascular system, unspecified: Secondary | ICD-10-CM

## 2012-07-13 LAB — GLUCOSE, CAPILLARY
Glucose-Capillary: 165 mg/dL — ABNORMAL HIGH (ref 70–99)
Glucose-Capillary: 160 mg/dL — ABNORMAL HIGH (ref 70–99)

## 2012-07-13 NOTE — Progress Notes (Signed)
Patient ID: Olivia Payne, female   DOB: 07-07-1970, 42 y.o.   MRN: [id number] BP 148/79  Pulse 65  Temp(Src) 98.1 F (36.7 C) (Oral)  Resp 20  Ht 5\' 7"  (1.702 m)  Wt 172.6 kg (380 lb 8.2 oz)  BMI 59.58 kg/m2  SpO2 99% Alert and oriented x 4 Headache much improved.  Await placement.  Results for orders placed during the hospital encounter of 07/09/12 (from the past 24 hour(s))  HEMOGLOBIN A1C     Status: Abnormal   Collection Time    07/12/12 10:00 PM      Result Value Range   Hemoglobin A1C 6.6 (*) <5.7 %   Mean Plasma Glucose 143 (*) <117 mg/dL  GLUCOSE, CAPILLARY     Status: Abnormal   Collection Time    07/12/12 10:03 PM      Result Value Range   Glucose-Capillary 309 (*) 70 - 99 mg/dL   Comment 1 Documented in Chart     Comment 2 Notify RN    GLUCOSE, CAPILLARY     Status: Abnormal   Collection Time    07/13/12  7:03 AM      Result Value Range   Glucose-Capillary 165 (*) 70 - 99 mg/dL   Comment 1 Documented in Chart     Comment 2 Notify RN    GLUCOSE, CAPILLARY     Status: Abnormal   Collection Time    07/13/12 12:45 PM      Result Value Range   Glucose-Capillary 160 (*) 70 - 99 mg/dL   Comment 1 Notify RN     Comment 2 Documented in Chart    GLUCOSE, CAPILLARY     Status: Abnormal   Collection Time    07/13/12  4:54 PM      Result Value Range   Glucose-Capillary 167 (*) 70 - 99 mg/dL   Comment 1 Notify RN     Comment 2 Documented in Chart    glucose seems better controlled.

## 2012-07-13 NOTE — Progress Notes (Signed)
Physical Therapy Treatment Patient Details Name: Olivia Payne MRN: [id number] DOB: 03/31/1970 Today's Date: 07/13/2012 Time: 4098-1191 PT Time Calculation (min): 31 min  PT Assessment / Plan / Recommendation Comments on Treatment Session  Pt very motivated.  Progressing with mobility but cont's to be unsteady with ambulation 2/2 LE instability/weakness.  Cont to strongly recommend CIR at d/c to maximize functional mobility & independence.      Follow Up Recommendations  CIR     Does the patient have the potential to tolerate intense rehabilitation     Barriers to Discharge        Equipment Recommendations       Recommendations for Other Services Rehab consult  Frequency Min 3X/week   Plan Discharge plan needs to be updated    Precautions / Restrictions Precautions Precautions: Fall Restrictions Weight Bearing Restrictions: No   Pertinent Vitals/Pain 4/10 HA.  Improving since AM.  Pt states "feels like someone is just shooting me in the head"    Mobility  Bed Mobility Bed Mobility: Left Sidelying to Sit;Sitting - Scoot to Edge of Bed Left Sidelying to Sit: 5: Supervision;With rails;HOB flat Sitting - Scoot to Edge of Bed: 5: Supervision Transfers Transfers: Sit to Stand;Stand to Sit Sit to Stand: 4: Min assist;With upper extremity assist;From bed;With armrests;From chair/3-in-1 Stand to Sit: 4: Min assist;With upper extremity assist;With armrests;To chair/3-in-1 Details for Transfer Assistance: Cues for safe hand placement & technique.  (A) to achieve standing, balance, & controlled descent.   Ambulation/Gait Ambulation/Gait Assistance: 4: Min assist;3: Mod assist (+2 to follow with recliner) Ambulation Distance (Feet): 100 Feet (60' + 40' ) Assistive device: Rolling walker Ambulation/Gait Assistance Details: Pt's LE's unsteady, varying between hyperextension & buckling.   Rt antalgic limp.  Pt steps on outside part of Rt foot initially & then rotates more towards  nuetral.  Pt also drags toes across floor with swing phase R>L.  Gait Pattern: Step-through pattern;Decreased stride length;Decreased dorsiflexion - right;Decreased dorsiflexion - left;Decreased hip/knee flexion - right;Decreased hip/knee flexion - left Gait velocity: slow Stairs: No      PT Goals Acute Rehab PT Goals Time For Goal Achievement: 07/25/12 Potential to Achieve Goals: Good Pt will go Supine/Side to Sit: with modified independence;with HOB 0 degrees PT Goal: Supine/Side to Sit - Progress: Progressing toward goal Pt will go Sit to Supine/Side: with modified independence;with HOB 0 degrees Pt will go Sit to Stand: with supervision;with upper extremity assist PT Goal: Sit to Stand - Progress: Progressing toward goal Pt will Ambulate: >150 feet;with supervision PT Goal: Ambulate - Progress: Progressing toward goal Pt will Go Up / Down Stairs: 6-9 stairs;with supervision;with rail(s)  Visit Information  Last PT Received On: 07/13/12 Assistance Needed: +2 (safety)    Subjective Data  Subjective: "It feels like someone is shooting me in the head"   Cognition  Cognition Arousal/Alertness: Awake/alert Behavior During Therapy: WFL for tasks assessed/performed Overall Cognitive Status: Within Functional Limits for tasks assessed    Balance     End of Session PT - End of Session Equipment Utilized During Treatment: Gait belt Activity Tolerance: Patient limited by fatigue Patient left: in chair;with call bell/phone within reach;with family/visitor present Nurse Communication: Mobility status     Verdell Face, [place] [phone] 07/13/2012
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
## 2012-07-14 LAB — GLUCOSE, CAPILLARY: Glucose-Capillary: 122 mg/dL — ABNORMAL HIGH (ref 70–99)

## 2012-07-14 NOTE — Discharge Summary (Signed)
Physician Discharge Summary  Patient ID: Olivia Payne MRN: [id number] DOB/AGE: [birth year] 42 y.o.  Admit date: 07/09/2012 Discharge date: 07/14/2012  Admission Diagnoses:Craniectomy defect, avm  Discharge Diagnoses:  Active Problems:   AVM (arteriovenous malformation) brain   Discharged Condition: good  Hospital Course: [name] was taken to the operating room where she had her bone flap replaced. Post op she has had headaches which now seem to be controlled with tramadol and toradol. She is at her neurologic baseline. She will be discharged to the rehab service.   Consults: Rehabilitation  Significant Diagnostic Studies: none  Treatments: surgery: Bone flap placement.  Discharge Exam: Blood pressure 142/88, pulse 86, temperature 97.9 F (36.6 C), temperature source Oral, resp. rate 18, height 5\' 7"  (1.702 m), weight 172.6 kg (380 lb 8.2 oz), SpO2 97.00%. General appearance: alert, cooperative and appears stated age Neurologic: Grossly normal, at baseline. Perrl, full eom, tongue and uvula midline. Moving all extremities. Overall weakness. Wound is clean, dry, and without signs of infection.   Disposition: 06-Home-Health Care Svc     Medication List    ASK your doctor about these medications       albuterol 108 (90 BASE) MCG/ACT inhaler  Commonly known as:  PROVENTIL HFA;VENTOLIN HFA  Inhale 2 puffs into the lungs every 6 (six) hours as needed for wheezing.     DULoxetine 30 MG capsule  Commonly known as:  CYMBALTA  Take 60 mg by mouth daily.     escitalopram 10 MG tablet  Commonly known as:  LEXAPRO  Take 10 mg by mouth daily.     HYDROcodone-acetaminophen 5-325 MG per tablet  Commonly known as:  NORCO/VICODIN  Take 1 tablet by mouth every 6 (six) hours as needed for pain.     levETIRAcetam 500 MG tablet  Commonly known as:  KEPPRA  Take 1 tablet (500 mg total) by mouth 2 (two) times daily.     levothyroxine 50 MCG tablet  Commonly known as:  SYNTHROID,  LEVOTHROID  Take 50 mcg by mouth daily.     lisinopril 10 MG tablet  Commonly known as:  PRINIVIL,ZESTRIL  Take 10 mg by mouth daily.     LORazepam 1 MG tablet  Commonly known as:  ATIVAN  Take 2 mg by mouth 2 (two) times daily.     medroxyPROGESTERone 150 MG/ML injection  Commonly known as:  DEPO-PROVERA  Inject 150 mg into the muscle every 3 (three) months. Last dose administered 01/28/2012.     metFORMIN 500 MG 24 hr tablet  Commonly known as:  GLUCOPHAGE-XR  Take 1 tablet (500 mg total) by mouth daily with breakfast.     oxyCODONE-acetaminophen 5-325 MG per tablet  Commonly known as:  PERCOCET/ROXICET  Take 1 tablet by mouth every 4 (four) hours as needed for pain. Take 2 and then hr later takes another one     pregabalin 75 MG capsule  Commonly known as:  LYRICA  Take 150 mg by mouth daily as needed. For nerve pain     topiramate 50 MG tablet  Commonly known as:  TOPAMAX  Take 50 mg by mouth daily as needed. For migraines     traMADol 50 MG tablet  Commonly known as:  ULTRAM  Take 50 mg by mouth every 6 (six) hours as needed for pain. Takes 3 at a time prn     traZODone 50 MG tablet  Commonly known as:  DESYREL  Take 100 mg by mouth at bedtime. For sleep  Signed: Afshin Chrystal L 07/14/2012, 6:26 PM

## 2012-07-14 NOTE — Progress Notes (Signed)
Patient ID: Olivia Payne, female   DOB: 11-23-1970, 42 y.o.   MRN: [id number] BP 142/88  Pulse 86  Temp(Src) 97.9 F (36.6 C) (Oral)  Resp 18  Ht 5\' 7"  (1.702 m)  Wt 172.6 kg (380 lb 8.2 oz)  BMI 59.58 kg/m2  SpO2 97% Alert and oriented x 4. Speech is clear, and fluent.  Moving all extremities. Doing well awaiting placement

## 2012-07-14 NOTE — Progress Notes (Signed)
Occupational Therapy Treatment Patient Details Name: Olivia Payne MRN: [id number] DOB: 1970-10-06 Today's Date: 07/14/2012 Time: 1410-1501 OT Time Calculation (min): 51 min  OT Assessment / Plan / Recommendation Comments on Treatment Session  Pt able to participate in visual assessment due to no headache today.  She demonstrates Lt. Homonomous hemianopsia, and describes inferior central scatoma Lt. Eye.  Pt. Endorses diplopia Lt superior quadrant and demonstrates significant light sensitivity Lt eye > Rt. Eye, and + Bonnet's phenomenon (states she frequently sees ants and bugs in left visual field).    Pt may benefit from tinted glasses to reduce glare and will work with pt on compensatory strategies to maximize available vision to increase safety and independence with ADLs.     Follow Up Recommendations  CIR;Supervision/Assistance - 24 hour    Barriers to Discharge       Equipment Recommendations  None recommended by OT    Recommendations for Other Services Rehab consult  Frequency Min 2X/week   Plan Discharge plan remains appropriate    Precautions / Restrictions Precautions Precautions: Fall Restrictions Weight Bearing Restrictions: No   Pertinent Vitals/Pain     ADL  ADL Comments: Visual assesment completed.  Pt without headache today.  Pt positive for photosensitivity OS > OD;  OS: Pursuits - Pt. with minimal difficulty stabilizing/sustaining gaze medially; Fields:  Lt hemianopsia and inferior central scatoma (pt is able to accurately describe placement of scatoma); pupillary function mildly delayed; OD:  Pursuits WFL; Fields: Lt. heminaopsia: pupilary function WFL:   Bil eyes together.  Pt. endorses diplopia Lt superior quadrant (?);  Convergence WFL;  Mildly dysconjugate gaze noted as pt crosses midline.  Discussed vision deficits with pt and spouse.  Pt. reports siginifcant difficulty when watching TV - she reports everything has a black background and she is unable to make  out details on the screen.  She is noted to consistently undershoot and overshoot when reaching for objects and oftentimes keeps Lt eye closed.  She is able to read small print single words OD, but only able to make out an occasional letter and color with OS    OT Diagnosis:    OT Problem List:   OT Treatment Interventions:     OT Goals Acute Rehab OT Goals Time For Goal Achievement: 07/26/12 Potential to Achieve Goals: Good ADL Goals Pt Will Perform Grooming: with supervision;Standing at sink Pt Will Perform Upper Body Bathing: with set-up;with supervision;Sitting at sink;Sitting, edge of bed;Sitting, chair Pt Will Perform Lower Body Bathing: with supervision;Sit to stand from chair;Sit to stand from bed Pt Will Perform Upper Body Dressing: with supervision;Sitting, chair;Sitting, bed Pt Will Perform Lower Body Dressing: with supervision;Sit to stand from chair;Sit to stand from bed Pt Will Transfer to Toilet: with supervision;Ambulation;Comfort height toilet;with DME Pt Will Perform Toileting - Clothing Manipulation: with supervision;Standing Pt Will Perform Toileting - Hygiene: with supervision;Standing at 3-in-1/toilet;Sit to stand from 3-in-1/toilet Pt Will Perform Tub/Shower Transfer: with supervision;with DME;Grab bars Additional ADL Goal #1: Pt will be able to locate and retrieve items during simple ADL tasks without over or undershooting Additional ADL Goal #2: Pt will be able to participate in further visual assesment ADL Goal: Additional Goal #2 - Progress: Progressing toward goals  Visit Information  Last OT Received On: 07/14/12 Assistance Needed: +2    Subjective Data      Prior Functioning       Cognition  Cognition Arousal/Alertness: Awake/alert Behavior During Therapy: WFL for tasks assessed/performed Overall Cognitive Status: Within Functional  Limits for tasks assessed    Mobility       Exercises      Balance     End of Session OT - End of  Session Activity Tolerance: Patient tolerated treatment well Patient left: in bed;with call bell/phone within reach;with family/visitor present  GO     Jemimah Cressy, Ursula Alert M 07/14/2012, 7:34 PM

## 2012-07-15 ENCOUNTER — Encounter (HOSPITAL_COMMUNITY): Payer: Self-pay | Admitting: Physical Medicine and Rehabilitation

## 2012-07-15 LAB — GLUCOSE, CAPILLARY
Glucose-Capillary: 151 mg/dL — ABNORMAL HIGH (ref 70–99)
Glucose-Capillary: 142 mg/dL — ABNORMAL HIGH (ref 70–99)
Glucose-Capillary: 207 mg/dL — ABNORMAL HIGH (ref 70–99)

## 2012-07-15 NOTE — Progress Notes (Signed)
Patient ID: Olivia Payne, female   DOB: 06-Sep-1970, 42 y.o.   MRN: [id number] BP 123/77  Pulse 94  Temp(Src) 98.7 F (37.1 C) (Oral)  Resp 20  Ht 5\' 7"  (1.702 m)  Wt 172.6 kg (380 lb 8.2 oz)  BMI 59.58 kg/m2  SpO2 98% Alert and oriented Headache seems to be well controlled with toradol and tramadol Probable discharge to rehab tomorrow.
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
## 2012-07-15 NOTE — Progress Notes (Signed)
Physical Therapy Treatment Patient Details Name: Olivia Payne MRN: [id number] DOB: 10/06/1970 Today's Date: 07/15/2012 Time: 7829-5621 PT Time Calculation (min): 33 min  PT Assessment / Plan / Recommendation Comments on Treatment Session  Very motivated. Initially impacted by her headache but this seemed to clear as we started to move. Legs definitely unsteady during gait so started LE exercises today. Was complaining more of RLE knee pain today.     Follow Up Recommendations  CIR     Does the patient have the potential to tolerate intense rehabilitation     Barriers to Discharge        Equipment Recommendations       Recommendations for Other Services    Frequency Min 3X/week   Plan Discharge plan remains appropriate;Frequency remains appropriate    Precautions / Restrictions Precautions Precautions: Fall Restrictions Weight Bearing Restrictions: No   Pertinent Vitals/Pain Initially reported severe headache, as we started to move she reported that it went away; pt with more complaints of right knee pain than anything towards the end of our session (from a fall and ?bone spur)    Mobility  Bed Mobility Bed Mobility: Supine to Sit Left Sidelying to Sit: 6: Modified independent (Device/Increase time) Sitting - Scoot to Edge of Bed: 6: Modified independent (Device/Increase time) Transfers Transfers: Stand to Sit;Sit to Stand Sit to Stand: With upper extremity assist;From bed;From chair/3-in-1;4: Min assist;3: Mod assist;With armrests Stand to Sit: With upper extremity assist;To bed;To chair/3-in-1;With armrests Details for Transfer Assistance: initially from taller surface pt needing minA for stability and cueing for safe hand placement, as she fatigued (stood x3 during session) she needing increasinglymore assist for proper technique and alignment of body segments as well as facilitation for stability and power up Ambulation/Gait Ambulation/Gait Assistance: 4: Min assist;3:  Mod assist (2nd person for increased gaurding as she fatigues) Ambulation Distance (Feet): 80 Feet (60ft + 15ft + 72ft) Assistive device: Rolling walker Ambulation/Gait Assistance Details: Stepping sequencing cues, needign more as she fatigued and as she was distracted; visual and verbal cues for improved foot clearance and heel strike bilaterally Gait Pattern: Step-through pattern;Decreased stride length;Decreased dorsiflexion - right;Decreased dorsiflexion - left;Decreased hip/knee flexion - right;Decreased hip/knee flexion - left General Gait Details: does tend to land on the lateral border of her right foot, drags her toes through during swing, at times even adducts to clear feet through Stairs: No    Exercises General Exercises - Lower Extremity Ankle Circles/Pumps: Both;10 reps;AAROM;Seated Long Arc Quad: AROM;Both;Seated;5 reps     PT Goals Acute Rehab PT Goals PT Goal: Supine/Side to Sit - Progress: Met PT Goal: Sit to Stand - Progress: Progressing toward goal PT Goal: Ambulate - Progress: Progressing toward goal  Visit Information  Assistance Needed: +2    Subjective Data  Subjective: I was hoping you would come. I will still try even if my head hurts.  Patient Stated Goal: to walk down the hall   Cognition  Cognition Arousal/Alertness: Awake/alert Behavior During Therapy: [facility] for tasks assessed/performed Area of Impairment: Attention Current Attention Level: Selective;Alternating    Balance     End of Session PT - End of Session Equipment Utilized During Treatment: Gait belt Activity Tolerance: Patient tolerated treatment well Patient left: in chair;with call bell/phone within reach;with family/visitor present (OT present)   GP     [facility] HELEN 07/15/2012, 3:35 PM

## 2012-07-15 NOTE — Progress Notes (Signed)
Rehab admissions - Evaluated for possible admission.  I have called insurance carrier and I have opened the case requesting acute inpatient rehab admission.  I am awaiting call back from insurance case manager regarding possible inpatient rehab admission.  Call me for questions.  #[phone]

## 2012-07-15 NOTE — Progress Notes (Addendum)
Occupational Therapy Treatment Patient Details Name: Olivia Payne MRN: [id number] DOB: [birth year] Today's Date: 07/15/2012 Time: 7829-5621 OT Time Calculation (min): 78 min  OT Assessment / Plan / Recommendation Comments on Treatment Session  Pt with significant improvement with ability to use residual vision with adaptations/compensations.  Pt with decrease in headache with use of sunglasses to reduce glare and occlusion of Lt central visual field.  Pt now able to read small amounts, locate items accurately in environment, etc.  Family has been instructed in activities to promote efficient visual scanning.  Will continue to follow until discharge to CIR.    Follow Up Recommendations  CIR;Supervision/Assistance - 24 hour    Barriers to Discharge       Equipment Recommendations  None recommended by OT    Recommendations for Other Services Rehab consult  Frequency Min 2X/week   Plan Discharge plan remains appropriate    Precautions / Restrictions Precautions Precautions: Fall Restrictions Weight Bearing Restrictions: No   Pertinent Vitals/Pain     ADL  ADL Comments: Pt provided with amber tinted sunglasses and blue tinted sunglasses to reduce glare.  Pt felt blue tint worked signficantly better.  Lt. central vision was occluded with tape to reduce visual information into central field due to existing scatoma.   With glasses in place, pt was able to watch TV, she was able to read 3 lines of newspaper print with cues to scan to Left, was able to read the headlines.  She was able to reach for and retrieve items in all areas with no undershooting or overshooting, and was able to begin to catch and throw object with minimal success.  Pt and husband instructed on use of anchor line on the left margin of page to guide vision to Lt margin to improve scanning to left, and son was going to buy pt simple word search puzzle.  Pt. with resolution of headache while OT working with pt with overhead  lights on, however, after ~45 mins, pt did request to turn off overhead lights due to glare.  Instructed pt to take breaks as her eyes fatigue, but to gradually continue to work with lights on as she is able to tolerate it, and to continue to practice visual activities such as word search, tossing objects from hand to hand, reading, watching TV (she has not been able to watch TV  previously).  Pt smiling and interactive by end of session.  (Pt instructed to wear glasses all the time when awake)    OT Diagnosis:    OT Problem List:   OT Treatment Interventions:     OT Goals Acute Rehab OT Goals OT Goal Formulation: With patient/family Time For Goal Achievement: 07/26/12 Potential to Achieve Goals: Good ADL Goals Pt Will Perform Grooming: with supervision;Standing at sink Pt Will Perform Upper Body Bathing: with set-up;with supervision;Sitting at sink;Sitting, edge of bed;Sitting, chair Pt Will Perform Lower Body Bathing: with supervision;Sit to stand from chair;Sit to stand from bed Pt Will Perform Upper Body Dressing: with supervision;Sitting, chair;Sitting, bed Pt Will Perform Lower Body Dressing: with supervision;Sit to stand from chair;Sit to stand from bed Pt Will Transfer to Toilet: with supervision;Ambulation;Comfort height toilet;with DME Pt Will Perform Toileting - Clothing Manipulation: with supervision;Standing Pt Will Perform Toileting - Hygiene: with supervision;Standing at 3-in-1/toilet;Sit to stand from 3-in-1/toilet Pt Will Perform Tub/Shower Transfer: with supervision;with DME;Grab bars Additional ADL Goal #1: Pt will be able to locate and retrieve items during simple ADL tasks without over or undershooting  ADL Goal: Additional Goal #1 - Progress: Progressing toward goals Additional ADL Goal #2: Pt will be able to participate in further visual assesment ADL Goal: Additional Goal #2 - Progress: Progressing toward goals  Visit Information  Last OT Received On:  07/15/12 Assistance Needed: +2    Subjective Data      Prior Functioning       Cognition  Cognition Arousal/Alertness: Awake/alert Behavior During Therapy: WFL for tasks assessed/performed Overall Cognitive Status: Within Functional Limits for tasks assessed Area of Impairment: Attention Current Attention Level: Selective;Alternating    Mobility  Bed Mobility Bed Mobility: Supine to Sit Left Sidelying to Sit: 6: Modified independent (Device/Increase time) Sitting - Scoot to Edge of Bed: 6: Modified independent (Device/Increase time) Transfers Sit to Stand: With upper extremity assist;From bed;From chair/3-in-1;4: Min assist;3: Mod assist;With armrests Stand to Sit: With upper extremity assist;To bed;To chair/3-in-1;With armrests Details for Transfer Assistance: initially from taller surface pt needing minA for stability and cueing for safe hand placement, as she fatigued (stood x3 during session) she needing increasinglymore assist for proper technique and alignment of body segments as well as facilitation for stability and power up    Exercises  General Exercises - Lower Extremity Ankle Circles/Pumps: Both;10 reps;AAROM;Seated Long Arc Quad: AROM;Both;Seated;5 reps   Balance     End of Session    GO     Olivia Payne 07/15/2012, 6:19 PM

## 2012-07-16 ENCOUNTER — Inpatient Hospital Stay (HOSPITAL_COMMUNITY)
Admission: RE | Admit: 2012-07-16 | Discharge: 2012-08-03 | DRG: 945 | Disposition: A | Discharge status: Home / Self Care | Payer: Managed Care, Other (non HMO) | Source: Intra-hospital | Attending: Physical Medicine & Rehabilitation | Admitting: Physical Medicine & Rehabilitation

## 2012-07-16 DIAGNOSIS — F3289 Other specified depressive episodes: Secondary | ICD-10-CM | POA: Diagnosis present

## 2012-07-16 DIAGNOSIS — R269 Unspecified abnormalities of gait and mobility: Secondary | ICD-10-CM

## 2012-07-16 DIAGNOSIS — M25569 Pain in unspecified knee: Secondary | ICD-10-CM | POA: Diagnosis present

## 2012-07-16 DIAGNOSIS — Z79899 Other long term (current) drug therapy: Secondary | ICD-10-CM

## 2012-07-16 DIAGNOSIS — E1139 Type 2 diabetes mellitus with other diabetic ophthalmic complication: Secondary | ICD-10-CM | POA: Diagnosis present

## 2012-07-16 DIAGNOSIS — M1710 Unilateral primary osteoarthritis, unspecified knee: Secondary | ICD-10-CM | POA: Diagnosis present

## 2012-07-16 DIAGNOSIS — Z5189 Encounter for other specified aftercare: Principal | ICD-10-CM

## 2012-07-16 DIAGNOSIS — Q048 Other specified congenital malformations of brain: Secondary | ICD-10-CM

## 2012-07-16 DIAGNOSIS — E039 Hypothyroidism, unspecified: Secondary | ICD-10-CM

## 2012-07-16 DIAGNOSIS — K5520 Angiodysplasia of colon without hemorrhage: Secondary | ICD-10-CM | POA: Diagnosis present

## 2012-07-16 DIAGNOSIS — E559 Vitamin D deficiency, unspecified: Secondary | ICD-10-CM | POA: Diagnosis present

## 2012-07-16 DIAGNOSIS — I10 Essential (primary) hypertension: Secondary | ICD-10-CM | POA: Diagnosis present

## 2012-07-16 DIAGNOSIS — Z87891 Personal history of nicotine dependence: Secondary | ICD-10-CM

## 2012-07-16 DIAGNOSIS — E1142 Type 2 diabetes mellitus with diabetic polyneuropathy: Secondary | ICD-10-CM | POA: Diagnosis present

## 2012-07-16 DIAGNOSIS — E1149 Type 2 diabetes mellitus with other diabetic neurological complication: Secondary | ICD-10-CM | POA: Diagnosis present

## 2012-07-16 DIAGNOSIS — IMO0002 Reserved for concepts with insufficient information to code with codable children: Secondary | ICD-10-CM | POA: Diagnosis present

## 2012-07-16 DIAGNOSIS — F319 Bipolar disorder, unspecified: Secondary | ICD-10-CM | POA: Diagnosis present

## 2012-07-16 DIAGNOSIS — M549 Dorsalgia, unspecified: Secondary | ICD-10-CM | POA: Diagnosis present

## 2012-07-16 DIAGNOSIS — G8929 Other chronic pain: Secondary | ICD-10-CM | POA: Diagnosis present

## 2012-07-16 DIAGNOSIS — B370 Candidal stomatitis: Secondary | ICD-10-CM | POA: Diagnosis not present

## 2012-07-16 DIAGNOSIS — K219 Gastro-esophageal reflux disease without esophagitis: Secondary | ICD-10-CM | POA: Diagnosis present

## 2012-07-16 DIAGNOSIS — F329 Major depressive disorder, single episode, unspecified: Secondary | ICD-10-CM | POA: Diagnosis present

## 2012-07-16 DIAGNOSIS — Q282 Arteriovenous malformation of cerebral vessels: Secondary | ICD-10-CM

## 2012-07-16 DIAGNOSIS — R51 Headache: Secondary | ICD-10-CM | POA: Diagnosis present

## 2012-07-16 DIAGNOSIS — F411 Generalized anxiety disorder: Secondary | ICD-10-CM | POA: Diagnosis present

## 2012-07-16 LAB — GLUCOSE, CAPILLARY
Glucose-Capillary: 164 mg/dL — ABNORMAL HIGH (ref 70–99)
Glucose-Capillary: 158 mg/dL — ABNORMAL HIGH (ref 70–99)

## 2012-07-16 MED ORDER — SENNA 8.6 MG PO TABS
1.0000 | ORAL_TABLET | Freq: Two times a day (BID) | ORAL | Status: DC
  Administered 2012-07-16 – 2012-08-02 (×18): 8.6 mg via ORAL
  Filled 2012-07-16 (×40): qty 1

## 2012-07-16 MED ORDER — PREGABALIN 75 MG PO CAPS
150.0000 mg | ORAL_CAPSULE | Freq: Every day | ORAL | Status: DC
  Administered 2012-07-17 – 2012-08-03 (×18): 150 mg via ORAL
  Filled 2012-07-16: qty 6
  Filled 2012-07-16: qty 2
  Filled 2012-07-16 (×3): qty 6
  Filled 2012-07-16 (×4): qty 2
  Filled 2012-07-16: qty 6
  Filled 2012-07-16: qty 2
  Filled 2012-07-16: qty 6
  Filled 2012-07-16 (×3): qty 2
  Filled 2012-07-16 (×3): qty 6

## 2012-07-16 MED ORDER — ALUM & MAG HYDROXIDE-SIMETH 200-200-20 MG/5ML PO SUSP: 30.0000 mL | ORAL | Status: DC | PRN

## 2012-07-16 MED ORDER — DIPHENHYDRAMINE HCL 12.5 MG/5ML PO ELIX
12.5000 mg | ORAL_SOLUTION | Freq: Four times a day (QID) | ORAL | Status: DC | PRN
  Filled 2012-07-16: qty 10

## 2012-07-16 MED ORDER — BISACODYL 10 MG RE SUPP: 10.0000 mg | Freq: Every day | RECTAL | Status: DC | PRN

## 2012-07-16 MED ORDER — PROCHLORPERAZINE 25 MG RE SUPP
12.5000 mg | Freq: Four times a day (QID) | RECTAL | Status: DC | PRN
  Filled 2012-07-16: qty 1

## 2012-07-16 MED ORDER — PROCHLORPERAZINE EDISYLATE 5 MG/ML IJ SOLN
5.0000 mg | Freq: Four times a day (QID) | INTRAMUSCULAR | Status: DC | PRN
  Filled 2012-07-16: qty 2

## 2012-07-16 MED ORDER — INSULIN ASPART 100 UNIT/ML ~~LOC~~ SOLN
0.0000 [IU] | Freq: Every day | SUBCUTANEOUS | Status: DC
Start: 2012-07-16 — End: 2012-08-03
  Administered 2012-07-17 – 2012-08-01 (×4): 2 [IU] via SUBCUTANEOUS

## 2012-07-16 MED ORDER — GUAIFENESIN-DM 100-10 MG/5ML PO SYRP: 5.0000 mL | ORAL_SOLUTION | Freq: Four times a day (QID) | ORAL | Status: DC | PRN

## 2012-07-16 MED ORDER — TOPIRAMATE 25 MG PO TABS
50.0000 mg | ORAL_TABLET | Freq: Every day | ORAL | Status: DC
  Administered 2012-07-17 – 2012-08-03 (×18): 50 mg via ORAL
  Filled 2012-07-16 (×20): qty 2

## 2012-07-16 MED ORDER — ESCITALOPRAM OXALATE 10 MG PO TABS
10.0000 mg | ORAL_TABLET | Freq: Every day | ORAL | Status: DC
  Administered 2012-07-17 – 2012-08-03 (×18): 10 mg via ORAL
  Filled 2012-07-16 (×20): qty 1

## 2012-07-16 MED ORDER — OXYCODONE HCL 5 MG PO TABS: 10.0000 mg | ORAL_TABLET | ORAL | Status: DC | PRN

## 2012-07-16 MED ORDER — INSULIN DETEMIR 100 UNIT/ML ~~LOC~~ SOLN
15.0000 [IU] | Freq: Every day | SUBCUTANEOUS | Status: DC
  Administered 2012-07-16 – 2012-08-01 (×17): 15 [IU] via SUBCUTANEOUS
  Filled 2012-07-16 (×18): qty 0.15

## 2012-07-16 MED ORDER — TRAZODONE HCL 100 MG PO TABS
100.0000 mg | ORAL_TABLET | Freq: Every day | ORAL | Status: DC
  Administered 2012-07-16 – 2012-08-02 (×17): 100 mg via ORAL
  Filled 2012-07-16 (×20): qty 1

## 2012-07-16 MED ORDER — OXYCODONE HCL 5 MG PO TABS
5.0000 mg | ORAL_TABLET | ORAL | Status: DC | PRN
  Administered 2012-07-17: 5 mg via ORAL
  Administered 2012-07-20: 10 mg via ORAL
  Administered 2012-07-28 – 2012-07-29 (×2): 5 mg via ORAL
  Administered 2012-07-30 – 2012-08-03 (×2): 10 mg via ORAL
  Filled 2012-07-16 (×3): qty 2
  Filled 2012-07-16 (×3): qty 1

## 2012-07-16 MED ORDER — ALBUTEROL SULFATE HFA 108 (90 BASE) MCG/ACT IN AERS
2.0000 | INHALATION_SPRAY | Freq: Four times a day (QID) | RESPIRATORY_TRACT | Status: DC | PRN
  Filled 2012-07-16: qty 6.7

## 2012-07-16 MED ORDER — LORAZEPAM 0.5 MG PO TABS
2.0000 mg | ORAL_TABLET | Freq: Two times a day (BID) | ORAL | Status: DC
  Administered 2012-07-16 – 2012-08-03 (×35): 2 mg via ORAL
  Filled 2012-07-16 (×14): qty 4
  Filled 2012-07-16: qty 1
  Filled 2012-07-16 (×22): qty 4

## 2012-07-16 MED ORDER — INSULIN ASPART 100 UNIT/ML ~~LOC~~ SOLN
0.0000 [IU] | Freq: Three times a day (TID) | SUBCUTANEOUS | Status: DC
  Administered 2012-07-17 (×2): 4 [IU] via SUBCUTANEOUS
  Administered 2012-07-17: 3 [IU] via SUBCUTANEOUS
  Administered 2012-07-18: 4 [IU] via SUBCUTANEOUS
  Administered 2012-07-18: 3 [IU] via SUBCUTANEOUS
  Administered 2012-07-18: 4 [IU] via SUBCUTANEOUS
  Administered 2012-07-19 (×2): 3 [IU] via SUBCUTANEOUS
  Administered 2012-07-19 – 2012-07-21 (×4): 4 [IU] via SUBCUTANEOUS
  Administered 2012-07-21: 7 [IU] via SUBCUTANEOUS
  Administered 2012-07-22: 3 [IU] via SUBCUTANEOUS
  Administered 2012-07-22: 7 [IU] via SUBCUTANEOUS
  Administered 2012-07-23: 4 [IU] via SUBCUTANEOUS
  Administered 2012-07-23 (×2): 3 [IU] via SUBCUTANEOUS
  Administered 2012-07-24 (×2): 4 [IU] via SUBCUTANEOUS
  Administered 2012-07-24: 3 [IU] via SUBCUTANEOUS
  Administered 2012-07-25: 4 [IU] via SUBCUTANEOUS
  Administered 2012-07-25: 7 [IU] via SUBCUTANEOUS
  Administered 2012-07-25 – 2012-07-27 (×3): 4 [IU] via SUBCUTANEOUS
  Administered 2012-07-27 – 2012-07-28 (×2): 3 [IU] via SUBCUTANEOUS
  Administered 2012-07-29: 4 [IU] via SUBCUTANEOUS
  Administered 2012-07-29 – 2012-07-31 (×4): 3 [IU] via SUBCUTANEOUS
  Administered 2012-07-31: 4 [IU] via SUBCUTANEOUS
  Administered 2012-07-31: 7 [IU] via SUBCUTANEOUS
  Administered 2012-08-01 – 2012-08-03 (×5): 3 [IU] via SUBCUTANEOUS

## 2012-07-16 MED ORDER — METFORMIN HCL ER 500 MG PO TB24
500.0000 mg | ORAL_TABLET | Freq: Every day | ORAL | Status: DC
  Administered 2012-07-17 – 2012-07-25 (×9): 500 mg via ORAL
  Filled 2012-07-16 (×11): qty 1

## 2012-07-16 MED ORDER — TRAMADOL HCL 50 MG PO TABS
100.0000 mg | ORAL_TABLET | Freq: Three times a day (TID) | ORAL | Status: DC
  Administered 2012-07-16 – 2012-08-03 (×51): 100 mg via ORAL
  Filled 2012-07-16 (×25): qty 2
  Filled 2012-07-16: qty 1
  Filled 2012-07-16 (×41): qty 2

## 2012-07-16 MED ORDER — DULOXETINE HCL 60 MG PO CPEP
60.0000 mg | ORAL_CAPSULE | Freq: Every day | ORAL | Status: DC
  Administered 2012-07-17 – 2012-08-03 (×18): 60 mg via ORAL
  Filled 2012-07-16 (×20): qty 1

## 2012-07-16 MED ORDER — LISINOPRIL 10 MG PO TABS
10.0000 mg | ORAL_TABLET | Freq: Every day | ORAL | Status: DC
  Administered 2012-07-17 – 2012-07-21 (×5): 10 mg via ORAL
  Filled 2012-07-16 (×6): qty 1

## 2012-07-16 MED ORDER — ACETAMINOPHEN 325 MG PO TABS
325.0000 mg | ORAL_TABLET | ORAL | Status: DC | PRN
  Administered 2012-07-29: 650 mg via ORAL
  Filled 2012-07-16: qty 2

## 2012-07-16 MED ORDER — LEVOTHYROXINE SODIUM 50 MCG PO TABS
50.0000 ug | ORAL_TABLET | Freq: Every day | ORAL | Status: DC
  Administered 2012-07-17 – 2012-08-03 (×18): 50 ug via ORAL
  Filled 2012-07-16 (×19): qty 1

## 2012-07-16 MED ORDER — NALOXONE HCL 0.4 MG/ML IJ SOLN: 0.0800 mg | INTRAMUSCULAR | Status: DC | PRN

## 2012-07-16 MED ORDER — FLEET ENEMA 7-19 GM/118ML RE ENEM
1.0000 | ENEMA | Freq: Once | RECTAL | Status: AC | PRN
  Filled 2012-07-16: qty 1

## 2012-07-16 MED ORDER — DEXAMETHASONE 2 MG PO TABS
2.0000 mg | ORAL_TABLET | Freq: Two times a day (BID) | ORAL | Status: DC
  Administered 2012-07-16 – 2012-07-27 (×23): 2 mg via ORAL
  Filled 2012-07-16 (×26): qty 1

## 2012-07-16 MED ORDER — SENNOSIDES-DOCUSATE SODIUM 8.6-50 MG PO TABS: 1.0000 | ORAL_TABLET | Freq: Every evening | ORAL | Status: DC | PRN

## 2012-07-16 MED ORDER — PROCHLORPERAZINE MALEATE 5 MG PO TABS
5.0000 mg | ORAL_TABLET | Freq: Four times a day (QID) | ORAL | Status: DC | PRN
  Filled 2012-07-16: qty 2

## 2012-07-16 MED ORDER — PANTOPRAZOLE SODIUM 40 MG PO TBEC
40.0000 mg | DELAYED_RELEASE_TABLET | Freq: Every day | ORAL | Status: DC
  Administered 2012-07-17 – 2012-08-03 (×18): 40 mg via ORAL
  Filled 2012-07-16 (×22): qty 1

## 2012-07-16 MED ORDER — LEVETIRACETAM 500 MG PO TABS
500.0000 mg | ORAL_TABLET | Freq: Two times a day (BID) | ORAL | Status: DC
  Administered 2012-07-16 – 2012-08-03 (×36): 500 mg via ORAL
  Filled 2012-07-16 (×40): qty 1

## 2012-07-16 NOTE — PMR Pre-admission (Signed)
PMR Admission Coordinator Pre-Admission Assessment  Patient: Olivia Payne is an 42 y.o., female MRN: [id number] DOB: December 16, 1970 Height: 5\' 7"  (170.2 cm) Weight: 172.6 kg (380 lb 8.2 oz)              Insurance Information HMO:      PPO:       PCP:       IPA:       80/20:       OTHER:  POS 2 PRIMARY: [employer] managed      Policy#: [id number]      Subscriber: Waynetta Pean CM Name: Annice Pih      Phone#: [phone]     Fax#: [phone] Pre-Cert#: [id number] X 7 days  5/16 to 5/22 Review due 07/23/12      Employer: [employer] Benefits:  Phone #: [phone]     Name: Merry Proud. Date: 03/04/11     Deduct: $3200(met)      Out of Pocket Max: $7200(met)      Life Max: unlimited CIR: 80% w/auth      SNF: 80% w/auth 120 days max Outpatient: 80% w/medical necessity     Co-Pay: 20% Home Health: 80%      Co-Pay: 20% DME: 80%     Co-Pay: 20% Providers: in network   Emergency Contact Information Contact Information   Name Relation Home Work Mobile   Yilmaz,Michael R Spouse [phone]  [phone]   Beckie Salts Mother [phone]  [phone]   Kelvin Cellar Daughter   [phone]     Current Medical History  Patient Admitting Diagnosis: Hx of right posterotemporal AVM s/p craniectomy. Now with flap replacement. I suspect she was still in the process of functional recovery and rehab prior to flap surgery. Now is having further problems with dizziness, balance, etc beyond "pre-flap replacement" baseline. Right knee OA is also a problem which affects balance and exercise/wb tolerance.   History of Present Illness:  A 42 y.o. female with history of DM with peripheral neuropathy, HTN, headaches and left visual deficits with workup revealing ruptured intraocular aneurysm and posterotemporal AVM treated with cerebral embolization and craniotomy with resection and placement of bone flap in abdomen on 05/26/12. She was admitted on 07/09/12 for cranioplasty by Dr Franky Macho. PT/OT evaluations done and patient with  moderate weakness, decreased balance and activity tolerance. PT, OT, MD recommending CIR    Past Medical History  Past Medical History  Diagnosis Date  . Asthma   . History of chicken pox   . Migraine   . Allergy   . Thyroid disease   . Neuropathy   . Hypertension   . Hypothyroidism   . Anxiety   . Depression   . Cerebral aneurysm   . Cerebral AVM   . Diabetes mellitus with other specified manifestations     peripheral neuropathy  . Sleep apnea     does not use Cpap  . GERD (gastroesophageal reflux disease)     hx  . 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   . OA (osteoarthritis) of knee     left knee with chronic pain  . Facial pain     since intubation    Family History  family history includes Cancer in her mother, other, and paternal aunt; Diabetes in her father and paternal grandmother; Heart attack (age of onset: 38) in her father; and Heart disease in her father.  Prior Rehab/Hospitalizations:  Had outpatient therapy in early May, 2014 in [place] at  [facility] and also at [facility].   Current Medications  Current facility-administered medications:0.9 % NaCl with KCl 20 mEq/ L  infusion, , Intravenous, Continuous, Carmela Hurt, MD, Last Rate: 80 mL/hr at 07/13/12 1610;  albuterol (PROVENTIL HFA;VENTOLIN HFA) 108 (90 BASE) MCG/ACT inhaler 2 puff, 2 puff, Inhalation, Q6H PRN, Carmela Hurt, MD;  bisacodyl (DULCOLAX) EC tablet 5 mg, 5 mg, Oral, Daily PRN, Carmela Hurt, MD dexamethasone (DECADRON) tablet 2 mg, 2 mg, Oral, Q12H, Carmela Hurt, MD, 2 mg at 07/16/12 0731;  DULoxetine (CYMBALTA) DR capsule 60 mg, 60 mg, Oral, Daily, Carmela Hurt, MD, 60 mg at 07/15/12 1031;  escitalopram (LEXAPRO) tablet 10 mg, 10 mg, Oral, Daily, Carmela Hurt, MD, 10 mg at 07/15/12 1031;  insulin aspart (novoLOG) injection 0-20 Units, 0-20 Units, Subcutaneous, TID WC, Carmela Hurt, MD, 4 Units at 07/16/12 0730 insulin aspart (novoLOG)  injection 0-5 Units, 0-5 Units, Subcutaneous, QHS, Carmela Hurt, MD, 2 Units at 07/15/12 2233;  insulin detemir (LEVEMIR) injection 15 Units, 15 Units, Subcutaneous, QHS, Carmela Hurt, MD, 15 Units at 07/15/12 2234;  ketorolac (TORADOL) tablet 10 mg, 10 mg, Oral, TID PRN, Carmela Hurt, MD, 10 mg at 07/15/12 2329;  labetalol (NORMODYNE,TRANDATE) injection 10-40 mg, 10-40 mg, Intravenous, Q10 min PRN, Carmela Hurt, MD levETIRAcetam (KEPPRA) tablet 500 mg, 500 mg, Oral, BID, Carmela Hurt, MD, 500 mg at 07/15/12 2237;  levothyroxine (SYNTHROID, LEVOTHROID) tablet 50 mcg, 50 mcg, Oral, QAC breakfast, Carmela Hurt, MD, 50 mcg at 07/16/12 9604;  lisinopril (PRINIVIL,ZESTRIL) tablet 10 mg, 10 mg, Oral, Daily, Carmela Hurt, MD, 10 mg at 07/15/12 1031;  LORazepam (ATIVAN) tablet 2 mg, 2 mg, Oral, BID, Carmela Hurt, MD, 2 mg at 07/15/12 2242 metFORMIN (GLUCOPHAGE-XR) 24 hr tablet 500 mg, 500 mg, Oral, Q breakfast, Carmela Hurt, MD, 500 mg at 07/16/12 5409;  morphine 2 MG/ML injection 2-6 mg, 2-6 mg, Intravenous, Q1H PRN, Carmela Hurt, MD, 6 mg at 07/11/12 2306;  naloxone (NARCAN) injection 0.08 mg, 0.08 mg, Intravenous, PRN, Carmela Hurt, MD;  ondansetron (ZOFRAN) injection 4 mg, 4 mg, Intravenous, Q4H PRN, Carmela Hurt, MD ondansetron (ZOFRAN) tablet 4 mg, 4 mg, Oral, Q4H PRN, Carmela Hurt, MD, 4 mg at 07/15/12 1055;  oxyCODONE (Oxy IR/ROXICODONE) immediate release tablet 5-10 mg, 5-10 mg, Oral, Q4H PRN, Carmela Hurt, MD, 10 mg at 07/16/12 8119;  pantoprazole (PROTONIX) EC tablet 40 mg, 40 mg, Oral, Daily, Carmela Hurt, MD, 40 mg at 07/15/12 1030;  pregabalin (LYRICA) capsule 150 mg, 150 mg, Oral, Daily, Carmela Hurt, MD, 150 mg at 07/15/12 1030 prochlorperazine (COMPAZINE) suppository 25 mg, 25 mg, Rectal, Q12H PRN, Carmela Hurt, MD;  promethazine (PHENERGAN) tablet 12.5-25 mg, 12.5-25 mg, Oral, Q4H PRN, Carmela Hurt, MD;  senna (SENOKOT) tablet 8.6 mg, 1 tablet, Oral, BID, Carmela Hurt, MD, 8.6 mg at 07/15/12 1031;  senna-docusate (Senokot-S) tablet 1 tablet, 1 tablet, Oral, QHS PRN, Carmela Hurt, MD sodium chloride 0.9 % injection 10-40 mL, 10-40 mL, Intracatheter, PRN, Carmela Hurt, MD, 10 mL at 07/14/12 0817;  topiramate (TOPAMAX) tablet 50 mg, 50 mg, Oral, Daily PRN, Carmela Hurt, MD;  traMADol (ULTRAM) tablet 50 mg, 50 mg, Oral, Q6H PRN, Carmela Hurt, MD, 50 mg at 07/14/12 1141;  traZODone (DESYREL) tablet 100 mg, 100 mg, Oral, QHS, Carmela Hurt, MD, 100 mg at 07/15/12 2238 Facility-Administered Medications Ordered in  Other Encounters: indocyanine green (IC-GREEN) injection 75 mg, 75 mg, Intravenous, Once, Carmela Hurt, MD  Patients Current Diet: Carb Control  Precautions / Restrictions Precautions Precautions: Fall Restrictions Weight Bearing Restrictions: No   Prior Activity Level Limited Community (1-2x/wk): Went to outpatient therapy 2 X a week in early May.  Otherwise went out of house 1 X a week.  Home Assistive Devices / Equipment Home Assistive Devices/Equipment: CBG Meter Home Adaptive Equipment: Bedside commode/3-in-1;Feeding equipment  Prior Functional Level Prior Function Level of Independence: Independent;Needs assistance Needs Assistance: Bathing;Dressing;Toileting;Meal Prep;Light Housekeeping;Gait Bath: Moderate Dressing: Moderate Toileting: Minimal Meal Prep: Total Light Housekeeping: Total Able to Take Stairs?: Yes Driving: No Vocation: Full time employment Comments: Prior to initial surgery, pt was fully independent with all BADLs and IADLs  Current Functional Level Cognition  Arousal/Alertness: Awake/alert Overall Cognitive Status: Within Functional Limits for tasks assessed Overall Cognitive Status: Impaired Current Attention Level: Selective;Alternating Orientation Level: Oriented X4 Awareness of Deficits: slow to process and problem solve mobility, unsure of baseline cognition, difficulty following multiple  verbal cues during ambulation    Extremity Assessment (includes Sensation/Coordination)  RUE ROM/Strength/Tone: WFL for tasks assessed RUE Coordination: WFL - gross/fine motor  RLE ROM/Strength/Tone: WFL for tasks assessed    ADLs  Eating/Feeding: Set up Where Assessed - Eating/Feeding: Chair;Bed level Grooming: Wash/dry hands;Wash/dry face;Teeth care;Set up Where Assessed - Grooming: Supported sitting Upper Body Bathing: Minimal assistance Where Assessed - Upper Body Bathing: Supported sitting;Unsupported sitting Lower Body Bathing: Moderate assistance Where Assessed - Lower Body Bathing: Supported sit to stand Upper Body Dressing: Minimal assistance Where Assessed - Upper Body Dressing: Unsupported sitting Lower Body Dressing: Moderate assistance Where Assessed - Lower Body Dressing: Supported sit to Pharmacist, hospital: +2 Total assistance Toilet Transfer: Patient Percentage: 70% Toilet Transfer Method: Sit to stand;Stand pivot Acupuncturist: Comfort height toilet Toileting - Clothing Manipulation and Hygiene: Moderate assistance Where Assessed - Toileting Clothing Manipulation and Hygiene: Standing Transfers/Ambulation Related to ADLs: total A +2 HHA (pt ~60-80%) decreases as she fatigues ADL Comments: Pt provided with amber tinted sunglasses and blue tinted sunglasses to reduce glare.  Pt felt blue tint worked signficantly better.  Lt. central vision was occluded with tape to reduce visual information into central field due to existing scatoma.   With glasses in place, pt was able to watch TV, she was able to read 3 lines of newspaper print with cues to scan to Left, was able to read the headlines.  She was able to reach for and retrieve items in all areas with no undershooting or overshooting, and was able to begin to catch and throw object with minimal success.  Pt and husband instructed on use of anchor line on the left margin of page to guide vision to Lt margin to  improve scanning to left, and son was going to buy pt simple word search puzzle.  Pt. with resolution of headache while OT working with pt with overhead lights on, however, after ~45 mins, pt did request to turn off overhead lights due to glare.  Instructed pt to take breaks as her eyes fatigue, but to gradually continue to work with lights on as she is able to tolerate it, and to continue to practice visual activities such as word search, tossing objects from hand to hand, reading, watching TV (she has not been able to watch TV  previously).  Pt smiling and interactive by end of session.  (Pt instructed to wear glasses all the time when awake)  Mobility  Bed Mobility: Supine to Sit Left Sidelying to Sit: 6: Modified independent (Device/Increase time) Supine to Sit: 4: Min guard;With rails;HOB flat Sitting - Scoot to Edge of Bed: 6: Modified independent (Device/Increase time) Sit to Supine: 5: Supervision    Transfers  Transfers: Stand to Sit;Sit to Stand Sit to Stand: With upper extremity assist;From bed;From chair/3-in-1;4: Min assist;3: Mod assist;With armrests Stand to Sit: With upper extremity assist;To bed;To chair/3-in-1;With armrests    Ambulation / Gait / Stairs / Wheelchair Mobility Ambulation/Gait Ambulation/Gait Assistance: 4: Min assist;3: Mod assist (2nd person for increased gaurding as she fatigues) Ambulation/Gait: Patient Percentage: 70% Ambulation Distance (Feet): 80 Feet (31ft + 11ft + 31ft) Assistive device: Rolling walker Ambulation/Gait Assistance Details: Stepping sequencing cues, needign more as she fatigued and as she was distracted; visual and verbal cues for improved foot clearance and heel strike bilaterally Gait Pattern: Step-through pattern;Decreased stride length;Decreased dorsiflexion - right;Decreased dorsiflexion - left;Decreased hip/knee flexion - right;Decreased hip/knee flexion - left Gait velocity: slow General Gait Details: does tend to land on the  lateral border of her right foot, drags her toes through during swing, at times even adducts to clear feet through Stairs: No    Posture / Balance Static Sitting Balance Static Sitting - Balance Support: Feet supported;Bilateral upper extremity supported Static Sitting - Level of Assistance: 4: Min assist    Special needs/care consideration BiPAP/CPAP No CPM No Continuous Drip IV No Dialysis No         Life Vest No Oxygen No Special Bed No Trach Size No Wound Vac (area) No       Skin Has reddended are under abdominal folds, under arms and under breasts.  Has cream for abdoman.  Has bruising on arms and left chest.                              Bowel mgmt: Had BM on 5/14 Bladder mgmt: Ambulating to bathroom to void Diabetic mgmt yes, on oral medications at home    Previous Home Environment Living Arrangements: Spouse/significant other Lives With: Spouse Available Help at Discharge: Family;Available 24 hours/day (for 2 more weeks then husband must return to work) Type of Home: Mobile home (double -wide) Home Layout: One level Home Access: Stairs to enter Entrance Stairs-Rails: Can reach both Entrance Stairs-Number of Steps: 8 Bathroom Shower/Tub: Health visitor: Standard Bathroom Accessibility: Yes How Accessible: Accessible via walker Home Care Services: No  Discharge Living Setting Plans for Discharge Living Setting: Patient's home;Lives with (comment);Mobile Home (Lives with husband and 26 yo son.) Type of Home at Discharge: Mobile home (Has a double wide mobile home.) Discharge Home Layout: One level Discharge Home Access: Stairs to enter Entrance Stairs-Number of Steps: 8 Do you have any problems obtaining your medications?: No  Social/Family/Support Systems Patient Roles: Spouse;Parent Contact Information: Negin Hegg - husband  Anticipated Caregiver: Husband Anticipated Caregiver's Contact Information: Casimiro Needle - spouse (h) [phone] (c)  [phone] Ability/Limitations of Caregiver: Husband going back to work next week.  Will have 2 weeks FMLA left plus 1 week unpaid hopefully from his employer. Caregiver Availability: 24/7 (For probably 3 weeks after discharge home.) Discharge Plan Discussed with Primary Caregiver: Yes Is Caregiver In Agreement with Plan?: Yes Does Caregiver/Family have Issues with Lodging/Transportation while Pt is in Rehab?: No  Goals/Additional Needs Patient/Family Goal for Rehab: PT S, OT S/min A, ST S goals Expected length of stay: 10-12 days Cultural Considerations: None Dietary  Needs: Carb mod med calorie, thin liquids Equipment Needs: TBD Pt/Family Agrees to Admission and willing to participate: Yes Program Orientation Provided & Reviewed with Pt/Caregiver Including Roles  & Responsibilities: Yes   Decrease burden of Care through IP rehab admission:  Not applicable   Possible need for SNF placement upon discharge: Not likely   Patient Condition: This patient's medical and functional status has changed since the consult dated: 07/13/12 in which the Rehabilitation Physician determined and documented that the patient's condition is appropriate for intensive rehabilitative care in an inpatient rehabilitation facility. See "History of Present Illness" (above) for medical update. Functional changes are: Currently ambulating min/mod assist 80 ft RW.  Participating well with therapies.  Is very motivated to get better.  Patient's medical and functional status update has been discussed with the Rehabilitation physician and patient remains appropriate for inpatient rehabilitation. Will admit to inpatient rehab today.  Preadmission Screen Completed By:  Trish Mage, 07/16/2012 10:12 AM ______________________________________________________________________   Discussed status with Dr. Riley Kill on 07/16/12 at 1035 and received telephone approval for admission today.  Admission Coordinator:  Trish Mage, time1035/Date05/16/14
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
## 2012-07-16 NOTE — Progress Notes (Signed)
Rehab admissions - I have authorization for acute inpatient rehab admission.  I have a bed available today.  Patient and husband are in agreement.  Bedside RN is aware.  Will admit to acute inpatient rehab today.  Call me for questions.  #[phone]

## 2012-07-16 NOTE — Interval H&P Note (Signed)
Olivia Payne was admitted today to Inpatient Rehabilitation with the diagnosis of right posterotemporal AVM s/p repair/cranioplasty with ongoing gait and visual spatial deficits.  The patient's history has been reviewed, patient examined, and there is no change in status.  Patient continues to be appropriate for intensive inpatient rehabilitation.  I have reviewed the patient's chart and labs.  Questions were answered to the patient's satisfaction.  SWARTZ,ZACHARY T 07/16/2012, 8:57 PM

## 2012-07-16 NOTE — Progress Notes (Addendum)
Overall Plan of Care [facility]) Patient Details Name: Olivia Payne MRN: [id number] DOB: Jul 20, 1970  Diagnosis:  Right AVM rupture with SAH, s/p craniotomy  Co-morbidities: Asthma  .  History of chicken pox  .  Migraine  .  Allergy  .  Thyroid disease  .  Neuropathy  .  Hypertension  .  Hypothyroidism  .  Anxiety  .  Depression  .  Cerebral aneurysm  .  Cerebral AVM  .  Diabetes mellitus with other specified manifestations  peripheral neuropathy  .  Sleep apnea  does not use Cpap  .  GERD (gastroesophageal reflux disease)  hx  .  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 Has had multiple procedures.  .  OA (osteoarthritis) of knee  left knee with chronic pain  .  Chronic headaches  .  Facial pain    Functional Problem List  Patient demonstrates impairments in the following areas: Balance, Bladder, Bowel, Cognition, Endurance, Linguistic, Medication Management, Motor, Pain, Perception, Safety, Sensory , Skin Integrity and Vision  Basic ADL's: grooming, bathing, dressing, toileting and transfers, visual perceptual Advanced ADL's: simple meal preparation  Transfers:  bed mobility, bed to chair, toilet, tub/shower, car and furniture Locomotion:  ambulation, wheelchair mobility and stairs  Additional Impairments:  Communication  comprehension and expression  Anticipated Outcomes Item Anticipated Outcome  Eating/Swallowing    Basic self-care   minimal assist  Tolieting  supervision  Bowel/Bladder  Remain continent of bowel/bladder  Transfers  Mod I  Locomotion  Supervision  Communication  Mod I  Cognition  Mod I  Pain  3 or less on scale 0-10  Safety/Judgment    Other   verbalize visual perceptual exercises with supervision level   Therapy Plan: PT Intensity: Minimum of 1-2 x/day ,45 to 90 minutes PT Frequency: 5 out of 7 days PT Duration Estimated Length of Stay: 2 weeks OT Intensity: Minimum of 1-2 x/day, 45 to 90 minutes OT Frequency: 5  out of 7 days OT Duration/Estimated Length of Stay: 2 weeks SLP Intensity: Minumum of 1-2 x/day, 30 to 90 minutes SLP Frequency: 5 out of 7 days SLP Duration/Estimated Length of Stay: 2 weeks    Team Interventions: Item RN PT OT SLP SW TR Other  Self Care/Advanced ADL Retraining  x X      Neuromuscular Re-Education  x X      Therapeutic Activities  x X   x   UE/LE Strength Training/ROM  x X    x   UE/LE Coordination Activities  x X   x   Visual/Perceptual Remediation/Compensation  x X   x   DME/Adaptive Equipment Instruction  x X   x   Therapeutic Exercise  x X   x   Balance/Vestibular Training  x X   x   Patient/Family Education x x X x  x   Cognitive Remediation/Compensation    x  x   Functional Mobility Training  x X   x   Ambulation/Gait Training  x       Stair Training  x       Wheelchair Propulsion/Positioning  x X      Surveyor, mining Facilitation    x     Bladder Management x        Bowel Management x  Disease Management/Prevention  x X      Pain Management x x X      Medication Management x   x     Skin Care/Wound Management x x       Splinting/Orthotics  x       Discharge Planning  x X   x   Psychosocial Support  x X   x                          Team Discharge Planning: Destination: PT-Home ,OT-  Home , SLP-Home Projected Follow-up: PT-Home health PT;24 hour supervision/assistance, OT- Home health OT  , SLP-24 hour supervision/assistance Projected Equipment Needs: PT-Rolling walker with 5" wheels;None recommended by PT;Wheelchair (measurements);Wheelchair cushion (measurements), OT-tub bench  , SLP-  Patient/family involved in discharge planning: PT- Family member/caregiver;Patient,  OT-Patient, spouse, SLP-Patient;Family member/caregiver  MD ELOS: 10 days Medical Rehab Prognosis:  Good Assessment: 42 yo female admitted to acute  for craniotomy, AVM resection, now requiring 24/7 Rehab RN/MD, CIR level PT/OT/SLP.  Team will focus on cognitive/perceptual skills, NM re education, co treatment of chronic R knee pain, with goals of supervision    See Team Conference Notes for weekly updates to the plan of care

## 2012-07-17 ENCOUNTER — Inpatient Hospital Stay (HOSPITAL_COMMUNITY): Payer: Managed Care, Other (non HMO) | Admitting: *Deleted

## 2012-07-17 ENCOUNTER — Inpatient Hospital Stay (HOSPITAL_COMMUNITY): Payer: Managed Care, Other (non HMO)

## 2012-07-17 ENCOUNTER — Encounter (HOSPITAL_COMMUNITY): Payer: Self-pay

## 2012-07-17 DIAGNOSIS — R269 Unspecified abnormalities of gait and mobility: Secondary | ICD-10-CM

## 2012-07-17 DIAGNOSIS — Q048 Other specified congenital malformations of brain: Secondary | ICD-10-CM

## 2012-07-17 LAB — GLUCOSE, CAPILLARY
Glucose-Capillary: 159 mg/dL — ABNORMAL HIGH (ref 70–99)
Glucose-Capillary: 122 mg/dL — ABNORMAL HIGH (ref 70–99)
Glucose-Capillary: 163 mg/dL — ABNORMAL HIGH (ref 70–99)

## 2012-07-17 MED ORDER — KETOROLAC TROMETHAMINE 10 MG PO TABS
10.0000 mg | ORAL_TABLET | Freq: Two times a day (BID) | ORAL | Status: AC | PRN
  Administered 2012-07-18 – 2012-07-20 (×2): 10 mg via ORAL
  Filled 2012-07-17 (×2): qty 1
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
## 2012-07-17 NOTE — Evaluation (Signed)
Speech Language Pathology Assessment and Plan  Patient Details  Name: Olivia Payne MRN: [id number] Date of Birth: 03/19/1970  SLP Diagnosis: Cognitive Impairments;Speech and Language deficits  Rehab Potential: Good ELOS: 2 weeks   Today's Date: 07/17/2012 Time: 1330-1430 Time Calculation (min): 60 min  Problem List:  Patient Active Problem List   Diagnosis Date Noted  . AVM (arteriovenous malformation) brain 05/26/2012  . Anxiety state, unspecified 08/01/2011  . Type II or unspecified type diabetes mellitus with ophthalmic manifestations, not stated as uncontrolled(250.50) 08/01/2011  . Hypothyroidism 08/01/2011  . Vitamin d deficiency 08/01/2011  . Weight gain 08/01/2011   Past Medical History:  Past Medical History  Diagnosis Date  . Asthma   . History of chicken pox   . Migraine   . Allergy   . Thyroid disease   . Neuropathy   . Hypertension   . Hypothyroidism   . Anxiety   . Depression   . Cerebral aneurysm   . Cerebral AVM   . Diabetes mellitus with other specified manifestations     peripheral neuropathy  . Sleep apnea     does not use Cpap  . GERD (gastroesophageal reflux disease)     hx  . 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   . OA (osteoarthritis) of knee     left knee with chronic pain  . Facial pain     since intubation   Past Surgical History:  Past Surgical History  Procedure Laterality Date  . Dilation and curettage of uterus  06/07/2002  . Cesarean section  1995  . Cerebral arteriogram    . Radiology with anesthesia N/A 04/20/2012    Procedure: RADIOLOGY WITH ANESTHESIA;  Surgeon: Oneal Grout, MD;  Location: [facility];  Service: Radiology;  Laterality: N/A;  . Radiology with anesthesia N/A 05/26/2012    Procedure: RADIOLOGY WITH ANESTHESIA;  Surgeon: Oneal Grout, MD;  Location: [facility];  Service: Radiology;  Laterality: N/A;  . Craniotomy Right 05/26/2012    Procedure: CRANIECTOMY INTRACRANIAL  ARTERIO-VENOUS MALFORMATION DURAL COMPLEX (AVM). PLACEMENT OF BONE FLAB IN ABDOMINAL WALL;  Surgeon: Carmela Hurt, MD;  Location: [facility];  Service: Neurosurgery;  Laterality: Right;  RIGHT Craniectomy for arteriovenous malformation resection  . Eye surgery  01/12/2013    laser   . Cranioplasty Right 07/09/2012    Procedure: CRANIOPLASTY;  Surgeon: Carmela Hurt, MD;  Location: [facility];  Service: Neurosurgery;  Laterality: Right;  Cranioplasty with bone retrieval from abdominal pocket    Assessment / Plan / Recommendation Clinical Impression  A 42 y.o. female with history of DM with peripheral neuropathy, HTN, headaches and left visual deficits with workup revealing ruptured intraocular aneurysm and posterotemporal AVM treated with cerebral embolization and craniotomy with resection and placement of bone flap in abdomen on 05/26/12. She was admitted on 07/09/12 for cranioplasty by Dr Franky Macho. PT/OT evaluations done and patient with moderate weakness, decreased balance and activity tolerance. PT, OT, MD recommended CIR and pt was admitted 05/16. Pt presents today with subjective complaints in the areas of sustained attention, memory retrieval, recall of new information, word-finding, and problem solving. Pt required Mod assist with functional task involving basic money sorting/calculations. Pt would benefit from skilled speech-language pathology intervention while in CIR to address the areas above in an attempt to increase her independence and safety prior to discharge home.    SLP Assessment  Patient will need skilled Speech  [place] Pathology Services during CIR admission    Recommendations  Patient destination: Home Follow up Recommendations: 24 hour supervision/assistance    SLP Frequency 5 out of 7 days   SLP Treatment/Interventions Cognitive remediation/compensation;Cueing hierarchy;Environmental controls;Functional tasks;Internal/external aids;Medication managment;Patient/family  education;Speech/Language facilitation;Therapeutic Activities    Pain Pain Assessment Pain Assessment: No/denies pain Pain Score:   2 Pain Type: Acute pain Pain Location: Head Pain Orientation: Right Pain Descriptors: Headache Pain Frequency: Intermittent Pain Onset: With Activity Patients Stated Pain Goal: 2 Pain Intervention(s): Medication (See eMAR) Prior Functioning Type of Home: Mobile home Lives With: Spouse Available Help at Discharge: Family;Available 24 hours/day Vocation: Full time employment Actuary)  Short Term Goals: Week 1: SLP Short Term Goal 1 (Week 1): Pt will demonstrate sustained attention for at least 10 minutes during a functional task with minimal environmental distractions and minimal cues from SLP. SLP Short Term Goal 1 - Progress (Week 1):  (New goal 05/17) SLP Short Term Goal 2 (Week 1): Pt will exhibit adequate medication management with modified independence. SLP Short Term Goal 2 - Progress (Week 1):  (New goal 05/17) SLP Short Term Goal 3 (Week 1): Pt will utilize external memory aids to recall new, daily information with minimal cues from SLP. SLP Short Term Goal 3 - Progress (Week 1):  (New goal 05/17)  See FIM for current functional status Refer to Care Plan for Long Term Goals  Recommendations for other services: None  Discharge Criteria: Patient will be discharged from SLP if patient refuses treatment 3 consecutive times without medical reason, if treatment goals not met, if there is a change in medical status, if patient makes no progress towards goals or if patient is discharged from hospital.  The above assessment, treatment plan, treatment alternatives and goals were discussed and mutually agreed upon: by patient and by family (husband/mother present).  Maxcine Ham 07/17/2012, 3:48 PM  Maxcine Ham, M.A. CCC-SLP

## 2012-07-17 NOTE — Plan of Care (Signed)
Problem: Food- and Nutrition-Related Knowledge Deficit (NB-1.1) Goal: Nutrition education Formal process to instruct or train a patient/client in a skill or to impart knowledge to help patients/clients voluntarily manage or modify food choices and eating behavior to maintain or improve health. Outcome: Completed/Met Date Met:  07/17/12  RD consulted for nutrition education regarding weight loss.  Pt has already lost 50 lb. I reinforced weight loss guidelines with pt and her husband. Pt with many questions which I answered.   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  RD provided handout from the [employer]. Emphasized the importance of serving sizes and provided examples of correct portions of common foods. Discussed importance of controlled and consistent intake throughout the day. Provided examples of ways to balance meals/snacks and encouraged intake of high-fiber, whole grain complex carbohydrates. Emphasized the importance of hydration with calorie-free beverages and limiting sugar-sweetened beverages. Encouraged pt to discuss physical activity options with physician. Teach back method used.  Expect good compliance.  Current diet order is CHO Modified, patient is consuming approximately 100% of meals at this time. Labs and medications reviewed. No further nutrition interventions warranted at this time. RD contact information provided. If additional nutrition issues arise, please re-consult RD.  Kendell Bane RD, LDN, CNSC [phone] Pager [phone] After Hours Pager

## 2012-07-17 NOTE — Evaluation (Signed)
Occupational Therapy Assessment and Plan  Patient Details  Name: Olivia Payne MRN: [id number] Date of Birth: 02-16-1971  OT Diagnosis: cognitive deficits, disturbance of vision, muscle weakness (generalized) and pain in joint Rehab Potential: Rehab Potential: Good ELOS: 2 weeks   Today's Date: 07/17/2012 Time:  [id number]  (60 min)  1st session Time Calculation (min): 60 min  Problem List:  Patient Active Problem List   Diagnosis Date Noted  . AVM (arteriovenous malformation) brain 05/26/2012  . Anxiety state, unspecified 08/01/2011  . Type II or unspecified type diabetes mellitus with ophthalmic manifestations, not stated as uncontrolled(250.50) 08/01/2011  . Hypothyroidism 08/01/2011  . Vitamin d deficiency 08/01/2011  . Weight gain 08/01/2011    Past Medical History:  Past Medical History  Diagnosis Date  . Asthma   . History of chicken pox   . Migraine   . Allergy   . Thyroid disease   . Neuropathy   . Hypertension   . Hypothyroidism   . Anxiety   . Depression   . Cerebral aneurysm   . Cerebral AVM   . Diabetes mellitus with other specified manifestations     peripheral neuropathy  . Sleep apnea     does not use Cpap  . GERD (gastroesophageal reflux disease)     hx  . 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   . OA (osteoarthritis) of knee     left knee with chronic pain  . Facial pain     since intubation   Past Surgical History:  Past Surgical History  Procedure Laterality Date  . Dilation and curettage of uterus  06/07/2002  . Cesarean section  1995  . Cerebral arteriogram    . Radiology with anesthesia N/A 04/20/2012    Procedure: RADIOLOGY WITH ANESTHESIA;  Surgeon: Oneal Grout, MD;  Location: [facility];  Service: Radiology;  Laterality: N/A;  . Radiology with anesthesia N/A 05/26/2012    Procedure: RADIOLOGY WITH ANESTHESIA;  Surgeon: Oneal Grout, MD;  Location: [facility];  Service: Radiology;  Laterality:  N/A;  . Craniotomy Right 05/26/2012    Procedure: CRANIECTOMY INTRACRANIAL ARTERIO-VENOUS MALFORMATION DURAL COMPLEX (AVM). PLACEMENT OF BONE FLAB IN ABDOMINAL WALL;  Surgeon: Carmela Hurt, MD;  Location: [facility];  Service: Neurosurgery;  Laterality: Right;  RIGHT Craniectomy for arteriovenous malformation resection  . Eye surgery  01/12/2013    laser   . Cranioplasty Right 07/09/2012    Procedure: CRANIOPLASTY;  Surgeon: Carmela Hurt, MD;  Location: [facility];  Service: Neurosurgery;  Laterality: Right;  Cranioplasty with bone retrieval from abdominal pocket    Assessment & Plan Clinical Impression:  MY RINKE is a 42 y.o. female with history of DM with peripheral neuropathy, HTN, headaches and left visual deficits with workup revealing ruptured intraocular aneurysm and posterotemporal AVM treated with cerebral embolization and craniotomy with resection and placement of bone flap in abdomen on 05/26/12. She was admitted on 07/09/12 for cranioplasty by Dr Franky Macho. PT/OT evaluations done and patient with moderate weakness, decreased balance and activity tolerance   Patient transferred to CIR on 07/16/2012 .    Patient currently requires mod with basic self-care skills secondary to muscle weakness, decreased visual perceptual skills, field cut and hemianopsia, decreased motor planning and decreased awareness, decreased safety awareness and decreased memory.  Prior to hospitalization, patient could complete BADL with min.  Patient will benefit from skilled intervention to decrease level of assist  with basic self-care skills and increase independence with basic self-care skills prior to discharge home with care partner.  Anticipate patient will require intermittent supervision and follow up home health.  OT - End of Session Activity Tolerance: Tolerates < 10 min activity with changes in vital signs Endurance Deficit: Yes Endurance Deficit Description: Requires frequent seated rest  breaks OT Assessment Rehab Potential: Good Barriers to Discharge: None OT Plan OT Intensity: Minimum of 1-2 x/day, 45 to 90 minutes OT Frequency: 5 out of 7 days OT Duration/Estimated Length of Stay: 2 weeks OT Treatment/Interventions: Balance/vestibular training;Community reintegration;Discharge planning;DME/adaptive equipment instruction;Functional mobility training;Patient/family education;Self Care/advanced ADL retraining;Therapeutic Activities;Therapeutic Exercise;UE/LE Strength taining/ROM;Visual/perceptual remediation/compensation;Pain management;Wheelchair propulsion/positioning;Cognitive remediation/compensation   Skilled Therapeutic Intervention 2nd session:  [phone]:  Pain:  Right knee with AROM   5/10   Individual session. Addressed therapeutic exercises for visual perceptual issues, strengthening and ROM in all 4 extremities, right knee decrebitus and AROM with ankle pumps with straight leg raise for 10 reps, 5 times a day.  Pt tolerated session in sitting.  Wants to address more ROM, strengthening and walking on next session.   OT Evaluation Precautions/Restrictions  Precautions Precautions: Fall Precaution Comments: LEs buckle, L hemianopsia and central visual deficits.  Restrictions Weight Bearing Restrictions: No General   Vital Signs Therapy Vitals Pain  5/10 head and right knee    Home Living/Prior Functioning Home Living Lives With: Spouse Available Help at Discharge: Family;Available 24 hours/day Type of Home: Mobile home Home Access: Stairs to enter Entrance Stairs-Number of Steps: 8 Entrance Stairs-Rails: Right;Left Home Layout: One level Bathroom Shower/Tub: Health visitor: Standard Bathroom Accessibility: Yes How Accessible: Accessible via walker Home Adaptive Equipment: Bedside commode/3-in-1;Feeding equipment Prior Function Level of Independence: Needs assistance with homemaking;Needs assistance with ADLs;Other (comment) Bath:  Minimal Dressing: Minimal Meal Prep: Total Light Housekeeping: Total Able to Take Stairs?: Yes Driving: No Vocation: Full time employment Comments: Prior to initial surgery, pt was fully independent with all BADLs and IADLs ADL   Vision/Perception  Vision - History Baseline Vision: Other (comment) Visual History: Brain injury Patient Visual Report: Blurring of vision;Overshooting;Undershooting;Central vision impairment Vision - Assessment Eye Alignment: Impaired (comment) Vision Assessment: Vision impaired - to be further tested in functional context Perception Perception: Impaired Inattention/Neglect: Does not attend to left visual field Spatial Orientation: Difficulty due to visual deficits Praxis Praxis: Intact  Cognition Overall Cognitive Status: Impaired/Different from baseline Arousal/Alertness: Awake/alert Orientation Level: Oriented X4 Attention: Sustained Sustained Attention: Impaired Sustained Attention Impairment: Verbal complex;Functional complex Memory: Impaired Memory Impairment: Decreased recall of new information Awareness: Appears intact Problem Solving: Impaired Problem Solving Impairment: Functional basic Executive Function: Landscape architect: Impaired Organizing Impairment: Functional basic Safety/Judgment: Impaired Comments: thought she could walk with no problems Sensation Sensation Light Touch: Appears Intact Proprioception: Not tested Coordination Gross Motor Movements are Fluid and Coordinated: No Fine Motor Movements are Fluid and Coordinated: No Coordination and Movement Description: Pt with decreased accuracy of movement with LEs L>R Finger Nose Finger Test: Equal on both sides Motor  Motor Motor: Abnormal postural alignment and control;Motor impersistence Motor - Skilled Clinical Observations: Pt with R lateral trunk lean, decreased upright posture in stance and with mobility, and motor impersistance in standing and gravity  resisted activities.  Mobility: Ambulated with RW and mod assist with 2nd person for safety about 8 feet on assessment.       Trunk/Postural Assessment  Cervical Assessment Cervical Assessment: Within Functional Limits Thoracic Assessment Thoracic Assessment: Within Functional Limits Lumbar Assessment Lumbar Assessment: Exceptions  to [facility] Postural Control Postural Control: Deficits on evaluation Postural Limitations: decreased reaching to floor and needing arms for assist to get upright  Balance Balance Balance Assessed: Yes Static Sitting Balance Static Sitting - Balance Support: Right upper extremity supported;Feet supported Static Sitting - Level of Assistance: 5: Stand by assistance Static Standing Balance Static Standing - Balance Support: Bilateral upper extremity supported Static Standing - Level of Assistance: 4: Min assist Dynamic Standing Balance Dynamic Standing - Balance Support: Bilateral upper extremity supported Dynamic Standing - Level of Assistance: 3: Mod assist Dynamic Standing - Balance Activities: Lateral lean/weight shifting;Forward lean/weight shifting;Reaching across midline;Reaching for objects Extremity/Trunk Assessment RUE Assessment RUE Assessment: Within Functional Limits LUE Assessment LUE Assessment: Within Functional Limits       Refer to Care Plan for Long Term Goals  Recommendations for other services: None  Discharge Criteria: Patient will be discharged from OT if patient refuses treatment 3 consecutive times without medical reason, if treatment goals not met, if there is a change in medical status, if patient makes no progress towards goals or if patient is discharged from hospital.  The above assessment, treatment plan, treatment alternatives and goals were discussed and mutually agreed upon: by family  Humberto Seals 07/17/2012, 6:55 PM

## 2012-07-17 NOTE — Progress Notes (Signed)
Subjective/Complaints: Slept well. Headache much improved today. Anxious to start therapies A 12 point review of systems has been performed and if not noted above is otherwise negative.   Objective: Vital Signs: Blood pressure 110/57, pulse 78, temperature 98.6 F (37 C), temperature source Oral, resp. rate 18, last menstrual period 07/07/1992, SpO2 96.00%. No results found. No results found for this basename: WBC, HGB, HCT, PLT,  in the last 72 hours No results found for this basename: NA, K, CL, CO, GLUCOSE, BUN, CREATININE, CALCIUM,  in the last 72 hours CBG (last 3)   Recent Labs  07/16/12 1706 07/16/12 2146 07/17/12 0743  GLUCAP 164* 158* 159*    Wt Readings from Last 3 Encounters:  07/11/12 172.6 kg (380 lb 8.2 oz)  07/11/12 172.6 kg (380 lb 8.2 oz)  07/07/12 147.555 kg (325 lb 4.8 oz)    Physical Exam:  Morbidly obese.  HENT:  Head: Normocephalic and atraumatic.  Eyes: EOM are normal. Pupils are equal, round, and reactive to light.  Neck: No tracheal deviation present. No thyromegaly present.  Cardiovascular: Normal rate and regular rhythm.  Pulmonary/Chest: Effort normal and breath sounds normal.  Abdominal: Soft. Bowel sounds are normal. She exhibits no distension. There is no tenderness.  Musculoskeletal: She exhibits edema.  Valgus and ER of right knee, tender to touch with mild effusion.  Lymphadenopathy:  She has no cervical adenopathy.  Neurological: Much more alert today. Improved awareness.   May have impaired vision in central fields. Has inattention throughout the left visual however. 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 Impaired sitting balance when distracted but stable at rest. . . Decreased FMC in all 4. Intentional tremor. Strength 4/5 RUE and 4- LUE. LE strength inhibited by knee pain ,right more than left. DTR's 1+, sensation grossly intact in all 4.  Skin:  Right scalp incision clean and well approximated with suture. Mild  scab, dried blood around incision   Assessment/Plan: 1. Functional deficits secondary to AVM s/p repair and eventual cranioplasty with ongoing pain,gait,cognitive issues which require 3+ hours per day of interdisciplinary therapy in a comprehensive inpatient rehab setting. Physiatrist is providing close team supervision and 24 hour management of active medical problems listed below. Physiatrist and rehab team continue to assess barriers to discharge/monitor patient progress toward functional and medical goals.  Team needs to be careful with light/photosensitivity  FIM:                   Comprehension Comprehension Mode: Auditory  Expression Expression Mode: Verbal Expression: 7-Expresses complex ideas: With no assist     Problem Solving Problem Solving: 7-Solves complex problems: Recognizes & self-corrects  Memory Memory: 6-More than reasonable amt of time  Medical Problem List and Plan:  1. DVT Prophylaxis/Anticoagulation: Pharmaceutical: Lovenox.  2. Facial pain/chronic back pain/chronic right knee pain/ headaches: Will continue topamax and lyrica for facial pain/headache. Topamax may need further titration as well. AddedVoltaren gel to help with knee pain. Ok to use icy hot patches to back daily.   -will make toradol available on a prn basis 3. Anxiety/Depression: continue cymbalta. Will need a lot of ego support by team. Seems anxious and distracted by multiple somatic complaints and poor insight/awareness.  4. Neuropsych: This patient is capable of making decisions on her own behalf.  5. DM type 2: Will monitor with AC/HS cbg checks. Continue metformin and lisinopril  6. HTN: Will monitor with bid checks.  7. Morbid obesity: Will have dietician educate patient weight  loss diet. Routine pressure relief measures  LOS (Days) 1 A FACE TO FACE EVALUATION WAS PERFORMED  Pedram Goodchild T 07/17/2012 8:50 AM

## 2012-07-18 ENCOUNTER — Inpatient Hospital Stay (HOSPITAL_COMMUNITY): Payer: Managed Care, Other (non HMO) | Admitting: *Deleted

## 2012-07-18 LAB — GLUCOSE, CAPILLARY
Glucose-Capillary: 168 mg/dL — ABNORMAL HIGH (ref 70–99)
Glucose-Capillary: 162 mg/dL — ABNORMAL HIGH (ref 70–99)
Glucose-Capillary: 141 mg/dL — ABNORMAL HIGH (ref 70–99)

## 2012-07-18 NOTE — Progress Notes (Signed)
Occupational Therapy Note  Patient Details  Name: Olivia Payne MRN: [id number] Date of Birth: Oct 21, 1970 Today's Date: 07/18/2012  Time:  1015-1100  (45 min) Individual session Pain:  5/10 head ache  Engaged in strengthening activities for BLE,BUE, visual scanning.  Used kinetron for 5 minutes in seated position.  Pt. Unable to do in standing.  Unable to do nustep due to knee pain she had from previous use. Used ball toss for visual scanning and focus.  Let pt use  the nerf football to toss in her room with her family members.  Also issued theraputty for hands for grasp and individual finger exercises.  Pt's sister plans to bring some bubble wrap for pt to use as well.  Pt. Propelled wc about 50 feet with complaint that the tire was flat but tires are not inflatable.      Humberto Seals 07/18/2012, 6:28 PM

## 2012-07-18 NOTE — Progress Notes (Signed)
Subjective/Complaints: Had a good day of therapy. Enjoyed grounds pass. Denies headache this am. Slept well A 12 point review of systems has been performed and if not noted above is otherwise negative.   Objective: Vital Signs: Blood pressure 131/75, pulse 61, temperature 98.3 F (36.8 C), temperature source Oral, resp. rate 18, last menstrual period 07/07/1992, SpO2 96.00%. No results found. No results found for this basename: WBC, HGB, HCT, PLT,  in the last 72 hours No results found for this basename: NA, K, CL, CO, GLUCOSE, BUN, CREATININE, CALCIUM,  in the last 72 hours CBG (last 3)   Recent Labs  07/17/12 1141 07/17/12 1623 07/17/12 2131  GLUCAP 122* 163* 207*    Wt Readings from Last 3 Encounters:  07/11/12 172.6 kg (380 lb 8.2 oz)  07/11/12 172.6 kg (380 lb 8.2 oz)  07/07/12 147.555 kg (325 lb 4.8 oz)    Physical Exam:  Morbidly obese.  HENT:  Head: Normocephalic and atraumatic.  Eyes: EOM are normal. Pupils are equal, round, and reactive to light.  Neck: No tracheal deviation present. No thyromegaly present.  Cardiovascular: Normal rate and regular rhythm.  Pulmonary/Chest: Effort normal and breath sounds normal.  Abdominal: Soft. Bowel sounds are normal. She exhibits no distension. There is no tenderness.  Musculoskeletal: She exhibits edema.  Valgus and ER of right knee, tender to touch with mild effusion.  Lymphadenopathy:  She has no cervical adenopathy.  Neurological: just awakening. A little slow to arouse but answers all my questions May have impaired vision in central fields. Has inattention throughout the left visual however. 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 Impaired sitting balance when distracted but stable at rest. . . Decreased FMC in all 4. Intentional tremor. Strength 4/5 RUE and 4- LUE. LE strength inhibited by knee pain ,right more than left. DTR's 1+, sensation grossly intact in all 4.  Skin:  Right scalp incision  clean and well approximated with suture. Mild scab, dried blood around incision   Assessment/Plan: 1. Functional deficits secondary to AVM s/p repair and eventual cranioplasty with ongoing pain,gait,cognitive issues which require 3+ hours per day of interdisciplinary therapy in a comprehensive inpatient rehab setting. Physiatrist is providing close team supervision and 24 hour management of active medical problems listed below. Physiatrist and rehab team continue to assess barriers to discharge/monitor patient progress toward functional and medical goals.  Team needs to be careful with light/photosensitivity  FIM:             FIM - Banker Devices: Walker;Arm rests Bed/Chair Transfer: 3: Bed > Chair or W/C: Mod A (lift or lower assist);3: Chair or W/C > Bed: Mod A (lift or lower assist)  FIM - Locomotion: Wheelchair Distance: 50 Locomotion: Wheelchair: 2: Travels 50 - 149 ft with minimal assistance (Pt.>75%) FIM - Locomotion: Ambulation Locomotion: Ambulation Assistive Devices: Designer, industrial/product Ambulation/Gait Assistance: 2: Max assist Locomotion: Ambulation: 1: Travels less than 50 ft with maximal assistance (Pt: 25 - 49%)  Comprehension Comprehension Mode: Auditory Comprehension: 5-Understands basic 90% of the time/requires cueing < 10% of the time  Expression Expression Mode: Verbal Expression: 5-Expresses basic 90% of the time/requires cueing < 10% of the time.  Social Interaction Social Interaction: 6-Interacts appropriately with others with medication or extra time (anti-anxiety, antidepressant).  Problem Solving Problem Solving: 2-Solves basic 25 - 49% of the time - needs direction more than half the time to initiate, plan or complete simple activities  Memory Memory: 5-Recognizes or  recalls 90% of the time/requires cueing < 10% of the time  Medical Problem List and Plan:  1. DVT Prophylaxis/Anticoagulation:  Pharmaceutical: Lovenox.  2. Facial pain/chronic back pain/chronic right knee pain/ headaches: Will continue topamax and lyrica for facial pain/headache. Topamax may need further titration as well. AddedVoltaren gel to help with knee pain. Ok to use icy hot patches to back daily.   - toradol available on a prn basis 3. Anxiety/Depression: continue cymbalta. Will need a lot of ego support by team. Seems anxious and distracted by multiple somatic complaints and poor insight/awareness.  4. Neuropsych: This patient is capable of making decisions on her own behalf.  5. DM type 2: Will monitor with AC/HS cbg checks. Continue metformin and lisinopril   -observe for pattern before making any changes. 6. HTN: Will monitor with bid checks.  7. Morbid obesity: Will have dietician educate patient weight loss diet. Routine pressure relief measures  LOS (Days) 2 A FACE TO FACE EVALUATION WAS PERFORMED  Xela Oregel T 07/18/2012 9:07 AM

## 2012-07-19 ENCOUNTER — Inpatient Hospital Stay (HOSPITAL_COMMUNITY): Payer: Managed Care, Other (non HMO)

## 2012-07-19 ENCOUNTER — Inpatient Hospital Stay (HOSPITAL_COMMUNITY): Payer: Managed Care, Other (non HMO) | Admitting: Physical Therapy

## 2012-07-19 ENCOUNTER — Inpatient Hospital Stay (HOSPITAL_COMMUNITY): Payer: Managed Care, Other (non HMO) | Admitting: Occupational Therapy

## 2012-07-19 DIAGNOSIS — Q048 Other specified congenital malformations of brain: Secondary | ICD-10-CM

## 2012-07-19 DIAGNOSIS — R269 Unspecified abnormalities of gait and mobility: Secondary | ICD-10-CM

## 2012-07-19 LAB — COMPREHENSIVE METABOLIC PANEL
ALT: 13 U/L (ref 0–35)
Alkaline Phosphatase: 92 U/L (ref 39–117)
BUN: 20 mg/dL (ref 6–23)
CO2: 26 mEq/L (ref 19–32)
Chloride: 104 mEq/L (ref 96–112)
GFR calc Af Amer: >90 mL/min (ref >90)
Glucose, Bld: 123 mg/dL — ABNORMAL HIGH (ref 70–99)
Potassium: 4.2 mEq/L (ref 3.5–5.1)
Sodium: 140 mEq/L (ref 135–145)
Total Bilirubin: 0.4 mg/dL (ref 0.3–1.2)

## 2012-07-19 LAB — GLUCOSE, CAPILLARY
Glucose-Capillary: 126 mg/dL — ABNORMAL HIGH (ref 70–99)
Glucose-Capillary: 159 mg/dL — ABNORMAL HIGH (ref 70–99)
Glucose-Capillary: 136 mg/dL — ABNORMAL HIGH (ref 70–99)

## 2012-07-19 LAB — CBC WITH DIFFERENTIAL/PLATELET
Hemoglobin: 12.2 g/dL (ref 12.0–15.0)
Lymphocytes Relative: 21 % (ref 12–46)
Lymphs Abs: 3.4 10*3/uL (ref 0.7–4.0)
MCH: 24 pg — ABNORMAL LOW (ref 26.0–34.0)
Monocytes Relative: 8 % (ref 3–12)
Neutro Abs: 11.4 10*3/uL — ABNORMAL HIGH (ref 1.7–7.7)
Neutrophils Relative %: 70 % (ref 43–77)
Platelets: 330 10*3/uL (ref 150–400)
RBC: 5.09 MIL/uL (ref 3.87–5.11)
WBC: 16.2 10*3/uL — ABNORMAL HIGH (ref 4.0–10.5)

## 2012-07-19 LAB — URINALYSIS, ROUTINE W REFLEX MICROSCOPIC
Bilirubin Urine: NEGATIVE
Glucose, UA: NEGATIVE mg/dL
Ketones, ur: NEGATIVE mg/dL
Leukocytes, UA: NEGATIVE
Nitrite: NEGATIVE
Specific Gravity, Urine: 1.012 (ref 1.005–1.030)
pH: 6 (ref 5.0–8.0)

## 2012-07-19 NOTE — Progress Notes (Signed)
Patient information reviewed and entered into eRehab system by Melodi Happel, RN, CRRN, PPS Coordinator.  Information including medical coding and functional independence measure will be reviewed and updated through discharge.    

## 2012-07-19 NOTE — Care Management Note (Signed)
[facility] Individual Statement of Services  Patient Name:  Olivia Payne  Date:  07/19/2012  Welcome to the [facility].  Our goal is to provide you with an individualized program based on your diagnosis and situation, designed to meet your specific needs.  With this comprehensive rehabilitation program, you will be expected to participate in at least 3 hours of rehabilitation therapies Monday-Friday, with modified therapy programming on the weekends.  Your rehabilitation program will include the following services:  Physical Therapy (PT), Occupational Therapy (OT), 24 hour per day rehabilitation nursing, Therapeutic Recreaction (TR), Neuropsychology, Case Management ( Social Worker), Rehabilitation Medicine, Nutrition Services and Pharmacy Services  Weekly team conferences will be held on Wednesday to discuss your progress.  Your Social Worker will talk with you frequently to get your input and to update you on team discussions.  Team conferences with you and your family in attendance may also be held.  Expected length of stay: 2 weeks Overall anticipated outcome: supervision/min level  Depending on your progress and recovery, your program may change. Your Social Worker will coordinate services and will keep you informed of any changes. Your Child psychotherapist names and contact numbers are listed  below.  The following services may also be recommended but are not provided by the [facility]:   Driving Evaluations  Home Health Rehabiltiation Services  Outpatient Rehabilitatation [facility]  Vocational Rehabilitation   Arrangements will be made to provide these services after discharge if needed.  Arrangements include referral to agencies that provide these services.  Your insurance has been verified to be:  [employer] Your primary doctor is:  Dr Donzetta Sprung  Pertinent information will be shared with your doctor and your insurance  company.  Social Worker:  Dossie Der, [place] [phone]  Information discussed with and copy given to patient by: Lucy Chris, 07/19/2012, 8:33 AM

## 2012-07-19 NOTE — Progress Notes (Signed)
Patient ID: Olivia Payne, female   DOB: June 28, 1970, 42 y.o.   MRN: [id number] Subjective/Complaints: I have knee pain on Right  Saw Dr Olivia Payne at [facility] told her she had a bone spur  A 12 point review of systems has been performed and if not noted above is otherwise negative.   Objective: Vital Signs: Blood pressure 133/79, pulse 77, temperature 97.9 F (36.6 C), temperature source Oral, resp. rate 18, last menstrual period 07/07/1992, SpO2 98.00%. No results found.  Recent Labs  07/19/12 0555  WBC 16.2*  HGB 12.2  HCT 39.2  PLT 330    Recent Labs  07/19/12 0555  NA 140  K 4.2  CL 104  GLUCOSE 123*  BUN 20  CREATININE 0.87  CALCIUM 9.0   CBG (last 3)   Recent Labs  07/18/12 1625 07/18/12 2020 07/19/12 0728  GLUCAP 162* 141* 124*    Wt Readings from Last 3 Encounters:  07/11/12 172.6 kg (380 lb 8.2 oz)  07/11/12 172.6 kg (380 lb 8.2 oz)  07/07/12 147.555 kg (325 lb 4.8 oz)    Physical Exam:  Morbidly obese.  HENT:  Head: Normocephalic and atraumatic.  Eyes: EOM are normal. Pupils are equal, round, and reactive to light.  Neck: No tracheal deviation present. No thyromegaly present.  Cardiovascular: Normal rate and regular rhythm.  Pulmonary/Chest: Effort normal and breath sounds normal.  Abdominal: Soft. Bowel sounds are normal. She exhibits no distension. There is no tenderness.  Musculoskeletal: She exhibits edema.  Valgus and ER of right knee, tender to touch with mild effusion.  Lymphadenopathy:  She has no cervical adenopathy.  Neurological: just awakening. A little slow to arouse but answers all my questions May have impaired vision in central fields. Has inattention throughout the left visual however. 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 Impaired sitting balance when distracted but stable at rest. . Strength 4/5 RUE and 4- LUE. LE strength inhibited by knee pain ,right more than left. DTR's 1+, sensation grossly  intact in all 4.  Skin:  Right scalp incision clean and well approximated with suture. Mild scab, dried blood around incision   Assessment/Plan: 1. Functional deficits secondary to AVM s/p repair and eventual cranioplasty with ongoing pain,gait,cognitive issues which require 3+ hours per day of interdisciplinary therapy in a comprehensive inpatient rehab setting. Physiatrist is providing close team supervision and 24 hour management of active medical problems listed below. Physiatrist and rehab team continue to assess barriers to discharge/monitor patient progress toward functional and medical goals.  Team needs to be careful with light/photosensitivity  FIM: FIM - Bathing Bathing Steps Patient Completed: Chest;Right Arm;Left lower leg (including foot);Right lower leg (including foot);Left Arm;Abdomen;Front perineal area;Buttocks;Right upper leg;Left upper leg (shower) Bathing: 6: More than reasonable amount of time  FIM - Upper Body Dressing/Undressing Upper body dressing/undressing steps patient completed: Thread/unthread right sleeve of pullover shirt/dresss;Thread/unthread left sleeve of pullover shirt/dress;Put head through opening of pull over shirt/dress;Pull shirt over trunk Upper body dressing/undressing: 5: Set-up assist to: Obtain clothing/put away  FIM - Toileting Toileting steps completed by patient: Performs perineal hygiene Toileting Assistive Devices: Grab bar or rail for support Toileting: 2: Max-Patient completed 1 of 3 steps  FIM - Diplomatic Services operational officer Devices: Grab bars Toilet Transfers: 3-To toilet/BSC: Mod A (lift or lower assist);3-From toilet/BSC: Mod A (lift or lower assist)  FIM - Bed/Chair Transfer Bed/Chair Transfer Assistive Devices: Bed rails Bed/Chair Transfer: 3: Bed > Chair or W/C: Mod A (lift  or lower assist);3: Chair or W/C > Bed: Mod A (lift or lower assist)  FIM - Locomotion: Wheelchair Distance: 50 Locomotion:  Wheelchair: 2: Travels 50 - 149 ft with minimal assistance (Pt.>75%) FIM - Locomotion: Ambulation Locomotion: Ambulation Assistive Devices: Designer, industrial/product Ambulation/Gait Assistance: 2: Max assist Locomotion: Ambulation: 1: Travels less than 50 ft with maximal assistance (Pt: 25 - 49%)  Comprehension Comprehension Mode: Auditory Comprehension: 5-Understands complex 90% of the time/Cues < 10% of the time  Expression Expression Mode: Verbal Expression: 5-Expresses basic 90% of the time/requires cueing < 10% of the time.  Social Interaction Social Interaction: 6-Interacts appropriately with others with medication or extra time (anti-anxiety, antidepressant).  Problem Solving Problem Solving: 2-Solves basic 25 - 49% of the time - needs direction more than half the time to initiate, plan or complete simple activities  Memory Memory: 5-Recognizes or recalls 90% of the time/requires cueing < 10% of the time  Medical Problem List and Plan:  1. DVT Prophylaxis/Anticoagulation: Pharmaceutical: Lovenox.  2. Facial pain/chronic back pain/chronic right knee pain/ headaches: Will continue topamax and lyrica for facial pain/headache. Topamax may need further titration as well. AddedVoltaren gel to help with knee pain. Ok to use icy hot patches to back daily.   - toradol available on a prn basis 3. Anxiety/Depression: continue cymbalta. Will need a lot of ego support by team. Seems anxious and distracted by multiple somatic complaints and poor insight/awareness.  4. Neuropsych: This patient is capable of making decisions on her own behalf.  5. DM type 2: Will monitor with AC/HS cbg checks. Continue metformin and lisinopril   -observe for pattern before making any changes. 6. HTN: Will monitor with bid checks.  7. Morbid obesity: Will have dietician educate patient weight loss diet. Routine pressure relief measures  LOS (Days) 3 A FACE TO FACE EVALUATION WAS PERFORMED  Olivia Payne  E 07/19/2012 8:20 AM

## 2012-07-19 NOTE — Progress Notes (Addendum)
Physical Therapy Session Note  Patient Details  Name: Olivia Payne MRN: [id number] Date of Birth: [birth year]  Today's Date: 07/19/2012 Time: 0905-1005 Time Calculation (min): 60 min  Short Term Goals: Week 1:  PT Short Term Goal 1 (Week 1): Pt will perform bed<>chair transfers with S PT Short Term Goal 2 (Week 1): Pt will ambulate 31' with Mod A PT Short Term Goal 3 (Week 1): Pt will ascend/descend 6 stairs with bil rails and Min A PT Short Term Goal 4 (Week 1): Pt will propell WC x150' with S in controlled environment  Skilled Therapeutic Interventions/Progress Updates:   Treatment focused on w/c mobility, sitting balance, gait.  Sitting balance while donning shoes and socks, supervision EOB.  W/c mobility using bil UEs x 60' x 100' with supervision on level tile.  neuromuscular re-education via demo, manual and VCs for bil LEs stance stability and swing components : - wt shifting L<>R in parallel bars -gait x 3' in parallel bars, with difficulty clearing bil feet, and L knee trembling during stance; attempted gait with RW- pt unable to progress -Kinetron at resistance 20 cm/sec, seated in w/c, working on neutral hip rotation/foot flat bil with assistance, as she extended hips and knees alternately    Therapy Documentation Precautions:  Precautions Precautions: Fall Precaution Comments: LEs buckle, L hemianopsia and central visual deficits.  Restrictions Weight Bearing Restrictions: No   Pain:- none reported     Other Treatments: Treatments Neuromuscular Facilitation: Left;Activity to increase motor control;Activity to increase timing and sequencing;Activity to increase grading;Activity to increase anterior-posterior weight shifting;Activity to increase sustained activation  See FIM for current functional status  Therapy/Group: Individual Therapy  Verlon Carcione 07/19/2012, 1:35 PM

## 2012-07-19 NOTE — Progress Notes (Signed)
Occupational Therapy Session Notes  Patient Details  Name: Olivia Payne MRN: [id number] Date of Birth: December 17, 1970  Today's Date: 07/19/2012 Time: 1035-1130 and 145-240 Time Calculation (min): 55 min and 55 min  Short Term Goals: Week 1:  OT Short Term Goal 1 (Week 1): Pt. will will demonstrate  visual accuracy with scanning on word search game OT Short Term Goal 2 (Week 1): Pt. will utilzed AE PRN for LB bathing and dressing  OT Short Term Goal 3 (Week 1): Pt.  will stand for 5 miutes during grooming with no rest break OT Short Term Goal 4 (Week 1): Pt. will transfer to toilet with minimal assist  Skilled Therapeutic Interventions/Progress Updates:  1)  Self care retraining to include toilet transfer, toileting, shower, dress and groom.  Initially, patient stated "I know how to bath and dress myself" and just wanted to go to the gym to get some exercise and do "physical therapy".  After discussion about role of occupational therapy and the OT goals on rehab, patient willing to participate in self care session. Focused session on activity tolerance, sit><stand, standing balance and visual scanning.  Approximately 3/4 through the session patient stated, "I can't believe how tired I am", "I see what you mean now".  Patient declined to stand to perform grooming tasks this morning.  2)  Upon arrival, patient sleeping in bed and initially difficult to arouse. Patient sat EOB from supine without any assistance, stood with RW and performed knee 10 bends, sat back down then performed stand step transfer with supervision.  Engaged in visual scanning and visual attention with table top task.  Patient reports that she can't look to the left however she proceeded to look to the left throughout the task.  At end of session, patient stated that her headache was trying to come back so she asked to go back to bed.  Without any assistance, patient stood up from w/c and while facing the bed, placed her left knee  into the bed then proceeded to climb in on all fours then laid on her left side without any assistance.  Therapy Documentation Precautions:  Precautions Precautions: Fall Precaution Comments: LEs buckle, L hemianopsia and central visual deficits.  Restrictions Weight Bearing Restrictions: No Pain: 3/10, headache, declined medication, rest ADL: See FIM for current functional status  Therapy/Group: Individual Therapy  Raman Featherston 07/19/2012, 11:30 AM
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
## 2012-07-19 NOTE — Progress Notes (Signed)
Physical Therapy Note  Patient Details  Name: Olivia Payne MRN: [id number] Date of Birth: [birth year] Today's Date: 07/19/2012  1530-1555 (25 minutes) individual Pain: 3/10 low back/premedicated Focus of treatment: Standing tolerance Treatment: Pt able to stand to raised table 2 X 10 minutes sorting cards with SBA and no c/o increased pain RT knee or instability left knee; wc mobility room >< gym SBA 120 feet with increased time.    Darlene Brozowski,JIM 07/19/2012, 4:03 PM

## 2012-07-20 ENCOUNTER — Inpatient Hospital Stay (HOSPITAL_COMMUNITY): Payer: Managed Care, Other (non HMO)

## 2012-07-20 ENCOUNTER — Inpatient Hospital Stay (HOSPITAL_COMMUNITY): Payer: Managed Care, Other (non HMO) | Admitting: Speech Pathology

## 2012-07-20 ENCOUNTER — Inpatient Hospital Stay (HOSPITAL_COMMUNITY): Payer: Managed Care, Other (non HMO) | Admitting: Occupational Therapy

## 2012-07-20 ENCOUNTER — Encounter (HOSPITAL_COMMUNITY): Payer: Managed Care, Other (non HMO) | Admitting: Occupational Therapy

## 2012-07-20 LAB — GLUCOSE, CAPILLARY: Glucose-Capillary: 112 mg/dL — ABNORMAL HIGH (ref 70–99)

## 2012-07-20 LAB — URINE CULTURE: Colony Count: 8000

## 2012-07-20 NOTE — Progress Notes (Signed)
Patient ID: Olivia Payne, female   DOB: 1970-04-06, 42 y.o.   MRN: [id number] Subjective/Complaints: "I have knee pain on Right " Saw Dr Penni Bombard at [facility] told her she had a bone spur Discussed finding from film yesterday- basically normal  A 12 point review of systems has been performed and if not noted above is otherwise negative.   Objective: Vital Signs: Blood pressure 101/69, pulse 97, temperature 97.6 F (36.4 C), temperature source Oral, resp. rate 20, last menstrual period 07/07/1992, SpO2 97.00%. Dg Knee 1-2 Views Right  07/19/2012   *RADIOLOGY REPORT*  Clinical Data: Right knee pain with popping  RIGHT KNEE - 1-2 VIEW  Comparison: None.  Findings: There may be only slightly decreased joint space laterally.  No fracture is seen.  No effusion is noted.  IMPRESSION: No acute abnormality.  Perhaps slight decrease and lateral joint space.   Original Report Authenticated By: Dwyane Dee, M.D.    Recent Labs  07/19/12 0555  WBC 16.2*  HGB 12.2  HCT 39.2  PLT 330    Recent Labs  07/19/12 0555  NA 140  K 4.2  CL 104  GLUCOSE 123*  BUN 20  CREATININE 0.87  CALCIUM 9.0   CBG (last 3)   Recent Labs  07/19/12 1637 07/19/12 2047 07/20/12 0730  GLUCAP 159* 136* 191*    Wt Readings from Last 3 Encounters:  07/11/12 172.6 kg (380 lb 8.2 oz)  07/11/12 172.6 kg (380 lb 8.2 oz)  07/07/12 147.555 kg (325 lb 4.8 oz)    Physical Exam:  Morbidly obese.  HENT:  Head: Normocephalic and atraumatic.  Eyes: EOM are normal. Pupils are equal, round, and reactive to light.  Neck: No tracheal deviation present. No thyromegaly present.  Cardiovascular: Normal rate and regular rhythm.  Pulmonary/Chest: Effort normal and breath sounds normal.  Abdominal: Soft. Bowel sounds are normal. She exhibits no distension. There is no tenderness.  Musculoskeletal: She exhibits edema.  Valgus and ER of right knee, tender to touch with mild effusion.  Lymphadenopathy:  She has no  cervical adenopathy.  Neurological: just awakening. A little slow to arouse but answers all my questions May have impaired vision in central fields. Has inattention throughout the left visual however. 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 Impaired sitting balance when distracted but stable at rest. . Strength 4/5 RUE and 4- LUE. LE strength inhibited by knee pain ,right more than left. DTR's 1+, sensation grossly intact in all 4.  Skin:  Right scalp incision clean and well approximated with suture. Mild scab, dried blood around incision   Assessment/Plan: 1. Functional deficits secondary to AVM s/p repair and eventual cranioplasty with ongoing pain,gait,cognitive issues which require 3+ hours per day of interdisciplinary therapy in a comprehensive inpatient rehab setting. Physiatrist is providing close team supervision and 24 hour management of active medical problems listed below. Physiatrist and rehab team continue to assess barriers to discharge/monitor patient progress toward functional and medical goals.  Team needs to be careful with light/photosensitivity  FIM: FIM - Bathing Bathing Steps Patient Completed: Chest;Right Arm;Left lower leg (including foot);Right lower leg (including foot);Left Arm;Abdomen;Front perineal area;Buttocks;Right upper leg;Left upper leg (shower) Bathing: 6: More than reasonable amount of time  FIM - Upper Body Dressing/Undressing Upper body dressing/undressing steps patient completed: Thread/unthread right sleeve of pullover shirt/dresss;Thread/unthread left sleeve of pullover shirt/dress;Put head through opening of pull over shirt/dress;Pull shirt over trunk Upper body dressing/undressing: 5: Set-up assist to: Obtain clothing/put away  FIM -  Toileting Toileting steps completed by patient: Performs perineal hygiene Toileting Assistive Devices: Grab bar or rail for support Toileting: 2: Max-Patient completed 1 of 3 steps  FIM -  Diplomatic Services operational officer Devices: Grab bars Toilet Transfers: 3-To toilet/BSC: Mod A (lift or lower assist);3-From toilet/BSC: Mod A (lift or lower assist)  FIM - Bed/Chair Transfer Bed/Chair Transfer Assistive Devices: Arm rests Bed/Chair Transfer: 4: Bed > Chair or W/C: Min A (steadying Pt. > 75%)  FIM - Locomotion: Wheelchair Distance: 100 Locomotion: Wheelchair: 2: Travels 50 - 149 ft with supervision, cueing or coaxing FIM - Locomotion: Ambulation Locomotion: Ambulation Assistive Devices: Parallel bars Ambulation/Gait Assistance: 1: +2 Total assist (1 person to stabililize w/c during sit>< stand) Locomotion: Ambulation: 1: Two helpers  Comprehension Comprehension Mode: Auditory Comprehension: 5-Understands complex 90% of the time/Cues < 10% of the time  Expression Expression Mode: Verbal Expression: 5-Expresses basic 90% of the time/requires cueing < 10% of the time.  Social Interaction Social Interaction: 6-Interacts appropriately with others with medication or extra time (anti-anxiety, antidepressant).  Problem Solving Problem Solving: 2-Solves basic 25 - 49% of the time - needs direction more than half the time to initiate, plan or complete simple activities  Memory Memory: 5-Recognizes or recalls 90% of the time/requires cueing < 10% of the time  Medical Problem List and Plan:  1. DVT Prophylaxis/Anticoagulation: Pharmaceutical: Lovenox.  2. Facial pain/chronic back pain/chronic right knee pain/ headaches: Will continue topamax and lyrica for facial pain/headache. Topamax may need further titration as well. AddedVoltaren gel to help with knee pain. Ok to use icy hot patches to back daily.   - toradol available on a prn basis 3. Anxiety/Depression: continue cymbalta. Will need a lot of ego support by team. Seems anxious and distracted by multiple somatic complaints and poor insight/awareness.  4. Neuropsych: This patient is capable of making decisions  on her own behalf.  5. DM type 2: Will monitor with AC/HS cbg checks. Continue metformin and lisinopril   -observe for pattern before making any changes. 6. HTN: Will monitor with bid checks.  7. Morbid obesity: Will have dietician educate patient weight loss diet. Routine pressure relief measures  LOS (Days) 4 A FACE TO FACE EVALUATION WAS PERFORMED  Erick Colace 07/20/2012 8:38 AM

## 2012-07-20 NOTE — Progress Notes (Signed)
Speech Language Pathology Daily Session Note  Patient Details  Name: Olivia Payne MRN: [id number] Date of Birth: [birth year]  Today's Date: 07/20/2012 Time: 1478-2956 Time Calculation (min): 40 min  Short Term Goals: Week 1: SLP Short Term Goal 1 (Week 1): Pt will demonstrate sustained attention for at least 10 minutes during a functional task with minimal environmental distractions and minimal cues from SLP. SLP Short Term Goal 1 - Progress (Week 1):  (New goal 05/17) SLP Short Term Goal 2 (Week 1): Pt will exhibit adequate medication management with modified independence. SLP Short Term Goal 2 - Progress (Week 1):  (New goal 05/17) SLP Short Term Goal 3 (Week 1): Pt will utilize external memory aids to recall new, daily information with minimal cues from SLP. SLP Short Term Goal 3 - Progress (Week 1):  (New goal 05/17)  Skilled Therapeutic Interventions: Skilled treatment session focused on addressing cognitive-linguistic goals.  SLP facilitated session with categorical naming tasks that required her to utilize working memory and naming strategies with overall Min assist verbal cues.  SLP also facilitated session by educating patient and mother regarding home carryover/program tasks that she could successfully perform with cues from family.  Patient describes difficulty recalling things but upon questioning she described difficulty sequencing tasks; as a result, this will be further assessed in diagnostic therapy tomorrow.    FIM:  Comprehension Comprehension Mode: Auditory Comprehension: 5-Understands complex 90% of the time/Cues < 10% of the time Expression Expression Mode: Verbal Expression: 5-Expresses basic 90% of the time/requires cueing < 10% of the time. Social Interaction Social Interaction: 6-Interacts appropriately with others with medication or extra time (anti-anxiety, antidepressant). Problem Solving Problem Solving: 3-Solves basic 50 - 74% of the time/requires cueing 25  - 49% of the time Memory Memory: 5-Recognizes or recalls 90% of the time/requires cueing < 10% of the time  Pain Pain Assessment Pain Assessment: No/denies pain Pain Score: 0-No pain Pain Location: Knee Pain Orientation: Right  Therapy/Group: Individual Therapy  [name]., CCC-SLP [phone]  Keon Pender 07/20/2012, 4:06 PM

## 2012-07-20 NOTE — Progress Notes (Signed)
Physical Therapy Note  Patient Details  Name: Olivia Payne MRN: [id number] Date of Birth: 1970-03-21 Today's Date: 07/20/2012  9:30 - 10:30 60 minutes Individual session Patient reports pain in right knee as 9. She reports that medication has already been given.  Patient propelled wheelchair 150 feet using UE's on level tile with supervision. Patient ambulated 10 feet x 1 and 12 feet x 1 with RW and mod assist and with second person following with wheelchair. Patient complained on right knee pain making weight shifting to the right difficult and left knee weakness feeling like the left knee was going to "give out." Patient stood 5 minutes holding to outside of parallel bars working on standing tolerance. In standing, patient worked on hip flexion, toe raises, and side stepping. Patient exercised on kinetron for 4 minutes on 20 cm/sec + 1 minute fast. Patient performed 20 LAQ's bilateral LE's. Patient left in wheelchair in room with items in reach.    Arelia Longest M 07/20/2012, 12:16 PM

## 2012-07-20 NOTE — Progress Notes (Signed)
Occupational Therapy Session Notes  Patient Details  Name: Olivia Payne MRN: [id number] Date of Birth: 1970/04/19  Today's Date: 07/20/2012 Time: 0800-0900 and 1115-1200 Time Calculation (min): 60 min and 45  Short Term Goals: Week 1:  OT Short Term Goal 1 (Week 1): Pt. will will demonstrate  visual accuracy with scanning on word search game OT Short Term Goal 2 (Week 1): Pt. will utilzed AE PRN for LB bathing and dressing  OT Short Term Goal 3 (Week 1): Pt.  will stand for 5 miutes during grooming with no rest break OT Short Term Goal 4 (Week 1): Pt. will transfer to toilet with minimal assist  Skilled Therapeutic Interventions/Progress Updates:  1) Self care retraining to include toilet transfer, toileting, shower, dress and groom.  Focused session on activity tolerance, bed mobility with be flat and no rails, sit><stands, standing tolerance, stand-step transfers (bed, w/c, commode, tub bench) with and without RW, adaptive techniques to improve independence, and ability to visually focus.  Patient having increased difficulty today with distractibility, sustained attention, sequencing, planning, organizing and memory.  Patient is also hyper-verbal so therefore she distracts herself!  2)  Addressed visual focus using tabletop-pen and paper task and finger strengthening with theraputty.  Patient required VCs to stay on task, develop a strategy and stay with it.  Issued another color of putty to increase strengthening and handout of theraputty exercises.  Therapy Documentation Precautions:  Precautions Precautions: Fall Precaution Comments: LEs buckle, L hemianopsia and central visual deficits.  Restrictions Weight Bearing Restrictions: No Pain: 1)  8/10 right knee, pain medication provided, heat pad on knee after shower 2)  Denies pain ADL: See FIM for current functional status  Therapy/Group: Individual Therapy  Malaika Arnall 07/20/2012, 10:55 AM

## 2012-07-21 ENCOUNTER — Inpatient Hospital Stay (HOSPITAL_COMMUNITY): Payer: Managed Care, Other (non HMO)

## 2012-07-21 ENCOUNTER — Inpatient Hospital Stay (HOSPITAL_COMMUNITY): Payer: Managed Care, Other (non HMO) | Admitting: Occupational Therapy

## 2012-07-21 ENCOUNTER — Encounter (HOSPITAL_COMMUNITY): Payer: Managed Care, Other (non HMO) | Admitting: Occupational Therapy

## 2012-07-21 LAB — GLUCOSE, CAPILLARY
Glucose-Capillary: 176 mg/dL — ABNORMAL HIGH (ref 70–99)
Glucose-Capillary: 214 mg/dL — ABNORMAL HIGH (ref 70–99)
Glucose-Capillary: 108 mg/dL — ABNORMAL HIGH (ref 70–99)

## 2012-07-21 MED ORDER — LISINOPRIL 5 MG PO TABS
5.0000 mg | ORAL_TABLET | Freq: Every day | ORAL | Status: DC
  Administered 2012-07-22 – 2012-07-23 (×2): 5 mg via ORAL
  Filled 2012-07-21 (×3): qty 1

## 2012-07-21 MED ORDER — DICLOFENAC SODIUM 1 % TD GEL
2.0000 g | Freq: Four times a day (QID) | TRANSDERMAL | Status: DC
  Administered 2012-07-21 – 2012-08-03 (×47): 2 g via TOPICAL
  Filled 2012-07-21 (×2): qty 100

## 2012-07-21 NOTE — Progress Notes (Signed)
Social Work Patient ID: Olivia Payne, female   DOB: [birth year], 42 y.o.   MRN: [id number] Met with pt and contacted husband via telephone to inform team conference goals-supervision/min level and discharge 6/3.  Pt is pleased she can stay this long she want sot get better. Informed husband so he can give notice at work regarding his two weeks off.  Pt feels she had a better day today and hopes each day it will get better.

## 2012-07-21 NOTE — Progress Notes (Signed)
Occupational Therapy Session Note  Patient Details  Name: Olivia Payne MRN: [id number] Date of Birth: Nov 09, 1970  Today's Date: 07/21/2012 Time: 0900-1000 Time Calculation (min): 60 min  Second Session: Time:  11:15-12:00 Time Calculation (min): 45 mins  Short Term Goals: Week 1:  OT Short Term Goal 1 (Week 1): Pt. will will demonstrate  visual accuracy with scanning on word search game OT Short Term Goal 2 (Week 1): Pt. will utilzed AE PRN for LB bathing and dressing  OT Short Term Goal 3 (Week 1): Pt.  will stand for 5 miutes during grooming with no rest break OT Short Term Goal 4 (Week 1): Pt. will transfer to toilet with minimal assist  Skilled Therapeutic Interventions/Progress Updates:    Pt worked on shower this am, using the RW with min assist for gathering clothes.  Pt at times would exhibit quivering in her left LE with weightbearing but did not note any buckling with mobility in the room during self care tasks.  Pt overall min assist level throughout session for all tasks that including standing or transfers.  Needed min instructional cueing to stay closer inside of the walker as well.  Pt sat EOB for donning shoes and socks with supervision as well.  Pt at one point was externally distracted by conversation and forgot to donn her left shoe and sock.  Needed mod questioning cues to notice and correct.  Session 2: Worked on bilateral hand strengthening and coordination while sitting on therapy mat. Utilized medium green therapy putty for pinch and intrinsic hand exercises to help increase dexterity and grip.  Pt with gross grip strength bilaterally at only 10lbs using dynomemeter.  Tip to tip pinch 6lbs bilaterally and lateral pinch 18 lbs on the right and 15 lbs on the left.  She was able to perform 9 hole peg test with the left hand in 27 seconds and 23 seconds in the right.  Discussed continuing her typing practice to help with coordination as well.  Performed each theraputty  exercise 3-4 times with each hand and mod instructional cueing for correct technique.  Therapy Documentation Precautions:  Precautions Precautions: Fall Precaution Comments: LEs buckle, L hemianopsia and central visual deficits.  Restrictions Weight Bearing Restrictions: No  Pain: Pain Assessment Pain Assessment: No/denies pain ADL: See FIM for current functional status  Therapy/Group: Individual Therapy  Andelyn Spade OTR/L 07/21/2012, 12:25 PM

## 2012-07-21 NOTE — Progress Notes (Addendum)
Inpatient RehabilitationTeam Conference and Plan of Care Update Date: 07/21/2012   Time: 11:01 AM    Patient Name: Olivia Payne      Medical Record Number: [id number]  Date of Birth: [birth year] Sex: Female         Room/Bed: 4153/4153-01 Payor Info: Payor: Monia Pouch / Plan: Derrek Gu / Product Type: *No Product type* /    Admitting Diagnosis: CRANIOPLASTY  CVA  Admit Date/Time:  07/16/2012  5:56 PM Admission Comments: No comment available   Primary Diagnosis:  AVM (arteriovenous malformation) brain Principal Problem: AVM (arteriovenous malformation) brain  Patient Active Problem List   Diagnosis Date Noted  . Morbid obesity 07/19/2012  . AVM (arteriovenous malformation) brain 05/26/2012  . Anxiety state, unspecified 08/01/2011  . Type II or unspecified type diabetes mellitus with ophthalmic manifestations, not stated as uncontrolled(250.50) 08/01/2011  . Hypothyroidism 08/01/2011  . Vitamin D deficiency 08/01/2011  . Weight gain 08/01/2011    Expected Discharge Date:  08/03/12  Team Members Present:     Dr. Wynn Banker, Kriste Basque Dupree SW, Melanee Spry RN, [facility] Perkinson OT, Fae Pippin ST, Yaak PT, Tora Duck PPS Coordinator, Rosalio Loud OT, Perlie Mayo PT, Wanda Plump PT, Bretta Bang Supervisor, Bayard Hugger Director, Gregor Hams.     Current Status/Progress Goal Weekly Team Focus  Medical   Headaches improved, patient focusing on right knee pain.  Improved right knee weightbearing tolerance  Initiate diclofenac gel, records from orthopedics   Bowel/Bladder   cont of B/B.  Will remain cont of B/B.  remain cont of B/B.   Swallow/Nutrition/ Hydration             ADL's   Overall min assist to supervision  Overall Supervision and Mod I for toilet transfers  activity tolerance, pain management, standing tolerance and standing balance, patient/caregiver    Mobility   mod assist amb 15 feet with RW due to right knee pain; supervision w/c mobility; min assist  basic transfers    mod I bed mobility and basic transfers; supervision car transters and ambulation; min assist for stairs    activity tolerance; generalized strengthening; standing, ambulation, and stairs; patient/family education   Communication   Min assist  Mod I   increase use and carryover of compensatory srategies   Safety/Cognition/ Behavioral Observations  Mod assist   Mod I   increase use of recall and organizational strategeis    Pain   C/o headaches. Scheduled ultram 100mg  po TID. Oxycodone 5-15mg  po prn q 4hrs.Toradol 10mg  po q 12 hrs as needed for headaches.  less than equal to 3.  will continue to monitor assess for pain medicate as needed.   Skin   bruising to bilateral arms. redness to abdominal folds microguard powder in use. healed crani to right side of head open to air   remain free of skin infection/breakdown.  will assess monitor skin q shift       *See Care Plan and progress notes for long and short-term goals.  Barriers to Discharge: Activity tolerance, morbid obesity    Possible Resolutions to Barriers:  See above, gradual reconditioning    Discharge Planning/Teaching Needs:  Home with husband for three weeks, then Mom to stay with but can not provide physical care      Team Discussion:  Limited by focus on knee pain  Revisions to Treatment Plan:  Plan Neuropsych eval   Continued Need for Acute Rehabilitation Level of Care: The patient requires daily medical management by a physician with  specialized training in physical medicine and rehabilitation for the following conditions: Daily direction of a multidisciplinary physical rehabilitation program to ensure safe treatment while eliciting the highest outcome that is of practical value to the patient.: Yes Daily medical management of patient stability for increased activity during participation in an intensive rehabilitation regime.: Yes Daily analysis of laboratory values and/or radiology reports with any  subsequent need for medication adjustment of medical intervention for : Neurological problems;Other  Brock Ra 07/21/2012, 6:49 PM

## 2012-07-21 NOTE — Progress Notes (Signed)
Physical Therapy Session Note  Patient Details  Name: Olivia Payne MRN: [id number] Date of Birth: 1970-06-01  Today's Date: 07/21/2012 Time:1000-1030 and  1300-1400  Time Calculation (min): 30 and 60 min  Short Term Goals: Week 1:  PT Short Term Goal 1 (Week 1): Pt will perform bed<>chair transfers with S PT Short Term Goal 2 (Week 1): Pt will ambulate 18' with Mod A PT Short Term Goal 3 (Week 1): Pt will ascend/descend 6 stairs with bil rails and Min A PT Short Term Goal 4 (Week 1): Pt will propell WC x150' with S in controlled environment  Skilled Therapeutic Interventions/Progress Updates:  1st session:  1st session:  no pain reported  Pt very enthusiastic to start PT session. Treatment focused on w/c propulsion, neuromuscular re-education via demo, tactile and manual cues for isolated LLE movements in sitting, L stance stability, and gait.  Gait with RW x 12' x 2 bariatric RW, with min assist for first trial, mod assist for 2nd trial.  Pt is very inconsistent with sequencing, L swing phase components.  She appears to be so fearful of R knee pain with wt bearing that she intermittently will not lift L foot. Pt reported a fall PTA for cranioplasty, injuring her R knee.  No edema noted.     L knee instability results in rapid "fluttering" after 3-4 steps, requiring VCS and manual cues to remain extended.  W/c propulsion using bil UEs , without legrests, bil LEs extended to clear floor; VCS to raise LLE as high as RLE.  2nd session:  Pt reported R knee felt much better since application of gel before tx.   Treatment focused on car transfers, w/c mobility, neuromuscular re-education via manual cues, VCS, demo for motor control LLE stance stability and swing phase components.  Car transfer with RW, to SUV height set, with supervision.   neuromuscular re-education for:  -LLE isolated movements in standing with bil UE support- open chain hip abduction in standing, calf raises with  shoulder extension against wall; in sitting- rapid hip abduction/adduction with neutral hip alignment, bil closed chain PF  -gait with RW x 20' with min assist, w/c brought behind pt by same therapist, with L knee instability noted after 10'.  Velocity extremely slow, but pt's performance and knee control is better with slow speed and limited cueing due to distractibility, fear of R or L knee buckling.   -Kinetron in standing with resistance 50>30 cm/sec working on wt shifting symmetrically, and L knee extension timing and sequencing.  W/c propulsion gym> room, within room,  with supervision, cues for brakes.  Pt demonstrated impulsivity during session, due to enthusiasm for therapy, almost attempting to stand without therapist next to her.  Therapist informed Nurse Tech that RW in room is to be used by OT and PT only.    Therapy Documentation  Pain Assessment Pain Assessment: No/denies pain Pain Intervention(s): Medication (See eMAR) (pt reported R knee numb from gel) Locomotion : Ambulation Ambulation/Gait Assistance: 3: Mod assist Wheelchair Mobility Distance: 150    Other Treatments: Treatments Neuromuscular Facilitation: Left;Activity to increase motor control;Activity to increase timing and sequencing;Activity to increase grading;Activity to increase anterior-posterior weight shifting;Activity to increase sustained activation  See FIM for current functional status  Therapy/Group: Individual Therapy  Donyae Kilner 07/21/2012, 3:00 PM

## 2012-07-21 NOTE — Progress Notes (Signed)
Patient ID: Olivia Payne, female   DOB: 1970-12-18, 42 y.o.   MRN: [id number] Subjective/Complaints: "I have knee pain on Right " Saw Dr Penni Bombard at [facility] told her she had a bone spur Discussed finding from film yesterday- basically normal  A 12 point review of systems has been performed and if not noted above is otherwise negative.   Objective: Vital Signs: Blood pressure 117/75, pulse 77, temperature 97.3 F (36.3 C), temperature source Oral, resp. rate 18, weight 146.7 kg (323 lb 6.6 oz), last menstrual period 07/07/1992, SpO2 96.00%. Dg Knee 1-2 Views Right  07/19/2012   *RADIOLOGY REPORT*  Clinical Data: Right knee pain with popping  RIGHT KNEE - 1-2 VIEW  Comparison: None.  Findings: There may be only slightly decreased joint space laterally.  No fracture is seen.  No effusion is noted.  IMPRESSION: No acute abnormality.  Perhaps slight decrease and lateral joint space.   Original Report Authenticated By: Dwyane Dee, M.D.    Recent Labs  07/19/12 0555  WBC 16.2*  HGB 12.2  HCT 39.2  PLT 330    Recent Labs  07/19/12 0555  NA 140  K 4.2  CL 104  GLUCOSE 123*  BUN 20  CREATININE 0.87  CALCIUM 9.0   CBG (last 3)   Recent Labs  07/20/12 1639 07/20/12 2056 07/21/12 0729  GLUCAP 184* 176* 214*    Wt Readings from Last 3 Encounters:  07/21/12 146.7 kg (323 lb 6.6 oz)  07/11/12 172.6 kg (380 lb 8.2 oz)  07/11/12 172.6 kg (380 lb 8.2 oz)    Physical Exam:  Morbidly obese.  HENT:  Head: Normocephalic and atraumatic.  Eyes: EOM are normal. Pupils are equal, round, and reactive to light.  Neck: No tracheal deviation present. No thyromegaly present.  Cardiovascular: Normal rate and regular rhythm.  Pulmonary/Chest: Effort normal and breath sounds normal.  Abdominal: Soft. Bowel sounds are normal. She exhibits no distension. There is no tenderness.  Musculoskeletal: She exhibits edema.  Valgus and ER of right knee, tender to touch with mild effusion.   Lymphadenopathy:  She has no cervical adenopathy.  Neurological: just awakening. A little slow to arouse but answers all my questions May have impaired vision in central fields. Has inattention throughout the left visual however. 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 Impaired sitting balance when distracted but stable at rest. . Strength 4/5 RUE and 4- LUE. LE strength inhibited by knee pain ,right more than left. DTR's 1+, sensation grossly intact in all 4.  Skin:  Right scalp incision clean and well approximated with suture. Mild scab, dried blood around incision   Assessment/Plan: 1. Functional deficits secondary to AVM s/p repair and eventual cranioplasty with ongoing pain,gait,cognitive issues which require 3+ hours per day of interdisciplinary therapy in a comprehensive inpatient rehab setting. Physiatrist is providing close team supervision and 24 hour management of active medical problems listed below. Physiatrist and rehab team continue to assess barriers to discharge/monitor patient progress toward functional and medical goals.    FIM: FIM - Bathing Bathing Steps Patient Completed: Chest;Right Arm;Left Arm;Abdomen;Front perineal area;Buttocks;Right upper leg;Left upper leg;Right lower leg (including foot);Left lower leg (including foot) Bathing: 4: Steadying assist  FIM - Upper Body Dressing/Undressing Upper body dressing/undressing steps patient completed: Thread/unthread right bra strap;Thread/unthread left bra strap;Hook/unhook bra;Thread/unthread right sleeve of pullover shirt/dresss;Thread/unthread left sleeve of pullover shirt/dress;Put head through opening of pull over shirt/dress;Pull shirt over trunk Upper body dressing/undressing: 5: Supervision: Safety issues/verbal cues FIM -  Lower Body Dressing/Undressing Lower body dressing/undressing steps patient completed: Thread/unthread right underwear leg;Thread/unthread left underwear leg;Pull underwear  up/down;Thread/unthread right pants leg;Thread/unthread left pants leg;Pull pants up/down;Don/Doff right sock;Don/Doff left sock;Don/Doff right shoe;Don/Doff left shoe;Fasten/unfasten right shoe;Fasten/unfasten left shoe Lower body dressing/undressing: 5: Supervision: Safety issues/verbal cues  FIM - Toileting Toileting steps completed by patient: Adjust clothing prior to toileting;Performs perineal hygiene;Adjust clothing after toileting Toileting Assistive Devices: Grab bar or rail for support Toileting: 4: Steadying assist  FIM - Diplomatic Services operational officer Devices: Grab bars Toilet Transfers: 4-To toilet/BSC: Min A (steadying Pt. > 75%);4-From toilet/BSC: Min A (steadying Pt. > 75%)  FIM - Bed/Chair Transfer Bed/Chair Transfer Assistive Devices: Arm rests Bed/Chair Transfer: 5: Supine > Sit: Supervision (verbal cues/safety issues);4: Bed > Chair or W/C: Min A (steadying Pt. > 75%);4: Chair or W/C > Bed: Min A (steadying Pt. > 75%)  FIM - Locomotion: Wheelchair Distance: 150 Locomotion: Wheelchair: 5: Travels 150 ft or more: maneuvers on rugs and over door sills with supervision, cueing or coaxing FIM - Locomotion: Ambulation Locomotion: Ambulation Assistive Devices: Designer, industrial/product Ambulation/Gait Assistance: 3: Mod assist Locomotion: Ambulation: 1: Two helpers  Comprehension Comprehension Mode: Auditory Comprehension: 5-Understands complex 90% of the time/Cues < 10% of the time  Expression Expression Mode: Verbal Expression: 5-Expresses basic 90% of the time/requires cueing < 10% of the time.  Social Interaction Social Interaction: 6-Interacts appropriately with others with medication or extra time (anti-anxiety, antidepressant).  Problem Solving Problem Solving: 3-Solves basic 50 - 74% of the time/requires cueing 25 - 49% of the time  Memory Memory: 5-Recognizes or recalls 90% of the time/requires cueing < 10% of the time  Medical Problem List and Plan:   1. DVT Prophylaxis/Anticoagulation: Pharmaceutical: Lovenox.  2. Facial pain/chronic back pain/chronic right knee pain/ headaches: Will continue topamax and lyrica for facial pain/headache. Topamax may need further titration as well. AddedVoltaren gel to help with knee pain. Ok to use icy hot patches to back daily.   - toradol available on a prn basis 3. Anxiety/Depression: continue cymbalta. Will need a lot of ego support by team. Seems anxious and distracted by multiple somatic complaints and poor insight/awareness.  4. Neuropsych: This patient is capable of making decisions on her own behalf.  5. DM type 2: Will monitor with AC/HS cbg checks. Continue metformin and lisinopril   -observe for pattern before making any changes. 6. HTN: Will monitor with bid checks.  7. Morbid obesity: Will have dietician educate patient weight loss diet. Routine pressure relief measures  LOS (Days) 5 A FACE TO FACE EVALUATION WAS PERFORMED  Herb Beltre E 07/21/2012 11:01 AM

## 2012-07-22 ENCOUNTER — Inpatient Hospital Stay (HOSPITAL_COMMUNITY): Payer: Managed Care, Other (non HMO)

## 2012-07-22 ENCOUNTER — Inpatient Hospital Stay (HOSPITAL_COMMUNITY): Payer: Managed Care, Other (non HMO) | Admitting: *Deleted

## 2012-07-22 ENCOUNTER — Inpatient Hospital Stay (HOSPITAL_COMMUNITY): Payer: Managed Care, Other (non HMO) | Admitting: Occupational Therapy

## 2012-07-22 ENCOUNTER — Inpatient Hospital Stay (HOSPITAL_COMMUNITY): Payer: Managed Care, Other (non HMO) | Admitting: Speech Pathology

## 2012-07-22 ENCOUNTER — Encounter (HOSPITAL_COMMUNITY): Payer: Managed Care, Other (non HMO)

## 2012-07-22 DIAGNOSIS — Q048 Other specified congenital malformations of brain: Secondary | ICD-10-CM

## 2012-07-22 DIAGNOSIS — R269 Unspecified abnormalities of gait and mobility: Secondary | ICD-10-CM

## 2012-07-22 LAB — GLUCOSE, CAPILLARY
Glucose-Capillary: 134 mg/dL — ABNORMAL HIGH (ref 70–99)
Glucose-Capillary: 121 mg/dL — ABNORMAL HIGH (ref 70–99)

## 2012-07-22 NOTE — Plan of Care (Signed)
Problem: RH SKIN INTEGRITY Goal: RH STG SKIN FREE OF INFECTION/BREAKDOWN Will remain free of skin infection/breakdown while on rehab with min assist of caregiver  Outcome: Progressing No signs of breakdown noted

## 2012-07-22 NOTE — Progress Notes (Signed)
**Note Olivia-Identified via Obfuscation** Patient ID: Olivia Payne, female   DOB: May 24, 1970, 42 y.o.   MRN: [id number] Subjective/Complaints: Reviewed [facility] Record 07/01/2012 Xrays showed mild spurring of sup patella which is not where pt is complaining of pain.  Rec was for iontophoresis Photograph A 12 point review of systems has been performed and if not noted above is otherwise negative.   Objective: Vital Signs: Blood pressure 101/70, pulse 86, temperature 98.1 F (36.7 C), temperature source Oral, resp. rate 17, weight 146.7 kg (323 lb 6.6 oz), last menstrual period 07/07/1992, SpO2 98.00%. No results found. No results found for this basename: WBC, HGB, HCT, PLT,  in the last 72 hours No results found for this basename: NA, K, CL, CO, GLUCOSE, BUN, CREATININE, CALCIUM,  in the last 72 hours CBG (last 3)   Recent Labs  07/21/12 1752 07/21/12 2052 07/22/12 0719  GLUCAP 175* 186* 205*    Wt Readings from Last 3 Encounters:  07/21/12 146.7 kg (323 lb 6.6 oz)  07/11/12 172.6 kg (380 lb 8.2 oz)  07/11/12 172.6 kg (380 lb 8.2 oz)    Physical Exam:  Morbidly obese.  HENT:  Head: Normocephalic and atraumatic.  Eyes: EOM are normal. Pupils are equal, round, and reactive to light.  Neck: No tracheal deviation present. No thyromegaly present.  Cardiovascular: Normal rate and regular rhythm.  Pulmonary/Chest: Effort normal and breath sounds normal.  Abdominal: Soft. Bowel sounds are normal. She exhibits no distension. There is no tenderness.  Musculoskeletal: She exhibits edema.  Valgus and ER of right knee, tender to touch with mild effusion.  Lymphadenopathy:  She has no cervical adenopathy.  Neurological: just awakening. A little slow to arouse but answers all my questions May have impaired vision in central fields. Has inattention throughout the left visual however. 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 Impaired sitting balance when distracted but stable at rest. . Strength 4/5 RUE  and 4- LUE. LE strength inhibited by knee pain ,right more than left. DTR's 1+, sensation grossly intact in all 4.  Skin:  Right scalp incision clean and well approximated with suture. Mild scab, dried blood around incision   Assessment/Plan: 1. Functional deficits secondary to AVM s/p repair and eventual cranioplasty with ongoing pain,gait,cognitive issues which require 3+ hours per day of interdisciplinary therapy in a comprehensive inpatient rehab setting. Physiatrist is providing close team supervision and 24 hour management of active medical problems listed below. Physiatrist and rehab team continue to assess barriers to discharge/monitor patient progress toward functional and medical goals.    FIM: FIM - Bathing Bathing Steps Patient Completed: Chest;Right Arm;Left Arm;Abdomen;Front perineal area;Buttocks;Right upper leg;Left upper leg;Right lower leg (including foot);Left lower leg (including foot) Bathing: 4: Steadying assist  FIM - Upper Body Dressing/Undressing Upper body dressing/undressing steps patient completed: Thread/unthread right bra strap;Thread/unthread left bra strap;Hook/unhook bra;Thread/unthread right sleeve of pullover shirt/dresss;Thread/unthread left sleeve of pullover shirt/dress;Put head through opening of pull over shirt/dress;Pull shirt over trunk Upper body dressing/undressing: 5: Supervision: Safety issues/verbal cues FIM - Lower Body Dressing/Undressing Lower body dressing/undressing steps patient completed: Thread/unthread right underwear leg;Thread/unthread left underwear leg;Pull underwear up/down;Thread/unthread right pants leg;Thread/unthread left pants leg;Pull pants up/down;Don/Doff right sock;Don/Doff left sock;Don/Doff right shoe;Don/Doff left shoe;Fasten/unfasten right shoe;Fasten/unfasten left shoe Lower body dressing/undressing: 5: Supervision: Safety issues/verbal cues  FIM - Toileting Toileting steps completed by patient: Adjust clothing prior  to toileting;Performs perineal hygiene;Adjust clothing after toileting Toileting Assistive Devices: Grab bar or rail for support Toileting: 4: Steadying assist  FIM - Toilet  Transfers Museum/gallery curator Devices: Therapist, music Transfers: 4-To toilet/BSC: Min A (steadying Pt. > 75%);4-From toilet/BSC: Min A (steadying Pt. > 75%)  FIM - Bed/Chair Transfer Bed/Chair Transfer Assistive Devices: Arm rests Bed/Chair Transfer: 5: Supine > Sit: Supervision (verbal cues/safety issues);4: Bed > Chair or W/C: Min A (steadying Pt. > 75%);4: Chair or W/C > Bed: Min A (steadying Pt. > 75%)  FIM - Locomotion: Wheelchair Distance: 150 Locomotion: Wheelchair: 5: Travels 150 ft or more: maneuvers on rugs and over door sills with supervision, cueing or coaxing FIM - Locomotion: Ambulation Locomotion: Ambulation Assistive Devices: Designer, industrial/product Ambulation/Gait Assistance: 3: Mod assist Locomotion: Ambulation: 1: Two helpers (12')  Comprehension Comprehension Mode: Auditory Comprehension: 5-Understands basic 90% of the time/requires cueing < 10% of the time  Expression Expression Mode: Verbal Expression: 5-Expresses basic 90% of the time/requires cueing < 10% of the time.  Social Interaction Social Interaction: 6-Interacts appropriately with others with medication or extra time (anti-anxiety, antidepressant).  Problem Solving Problem Solving: 3-Solves basic 50 - 74% of the time/requires cueing 25 - 49% of the time  Memory Memory: 5-Recognizes or recalls 90% of the time/requires cueing < 10% of the time  Medical Problem List and Plan:  1. DVT Prophylaxis/Anticoagulation: Pharmaceutical: Lovenox.  2. Facial pain/chronic back pain/chronic right knee pain/ headaches: Will continue topamax and lyrica for facial pain/headache. Topamax may need further titration as well. AddedVoltaren gel to help with knee pain. Ok to use icy hot patches to back daily.   - toradol available on a prn  basis 3. Anxiety/Depression: continue cymbalta. Will need a lot of ego support by team. Seems anxious and distracted by multiple somatic complaints and poor insight/awareness.  4. Neuropsych: This patient is capable of making decisions on her own behalf.  5. DM type 2: Will monitor with AC/HS cbg checks. Continue metformin and lisinopril   -observe for pattern before making any changes. 6. HTN: Will monitor with bid checks.  7. Morbid obesity: Will have dietician educate patient weight loss diet. Routine pressure relief measures 8.  R knee pain, suspect soft tissue, no instability to exam, expect improvement with time and therapy LOS (Days) 6 A FACE TO FACE EVALUATION WAS PERFORMED  Erick Colace 07/22/2012 7:44 AM

## 2012-07-22 NOTE — Evaluation (Signed)
Recreational Therapy Assessment and Plan  Patient Details  Name: Olivia Payne MRN: [id number] Date of Birth: [birth year] Today's Date: 07/22/2012  Rehab Potential: Good ELOS: 2 weeks   Assessment Clinical Impression: Problem List:  Patient Active Problem List    Diagnosis  Date Noted   .  AVM (arteriovenous malformation) brain  05/26/2012   .  Anxiety state, unspecified  08/01/2011   .  Type II or unspecified type diabetes mellitus with ophthalmic manifestations, not stated as uncontrolled(250.50)  08/01/2011   .  Hypothyroidism  08/01/2011   .  Vitamin d deficiency  08/01/2011   .  Weight gain  08/01/2011    Past Medical History:  Past Medical History   Diagnosis  Date   .  Asthma    .  History of chicken pox    .  Migraine    .  Allergy    .  Thyroid disease    .  Neuropathy    .  Hypertension    .  Hypothyroidism    .  Anxiety    .  Depression    .  Cerebral aneurysm    .  Cerebral AVM    .  Diabetes mellitus with other specified manifestations      peripheral neuropathy   .  Sleep apnea      does not use Cpap   .  GERD (gastroesophageal reflux disease)      hx   .  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   .  OA (osteoarthritis) of knee      left knee with chronic pain   .  Facial pain      since intubation    Past Surgical History:  Past Surgical History   Procedure  Laterality  Date   .  Dilation and curettage of uterus   06/07/2002   .  Cesarean section   1995   .  Cerebral arteriogram     .  Radiology with anesthesia  N/A  04/20/2012     Procedure: RADIOLOGY WITH ANESTHESIA; Surgeon: Oneal Grout, MD; Location: [facility]; Service: Radiology; Laterality: N/A;   .  Radiology with anesthesia  N/A  05/26/2012     Procedure: RADIOLOGY WITH ANESTHESIA; Surgeon: Oneal Grout, MD; Location: [facility]; Service: Radiology; Laterality: N/A;   .  Craniotomy  Right  05/26/2012     Procedure: CRANIECTOMY INTRACRANIAL  ARTERIO-VENOUS MALFORMATION DURAL COMPLEX (AVM). PLACEMENT OF BONE FLAB IN ABDOMINAL WALL; Surgeon: Carmela Hurt, MD; Location: [facility]; Service: Neurosurgery; Laterality: Right; RIGHT Craniectomy for arteriovenous malformation resection   .  Eye surgery   01/12/2013     laser   .  Cranioplasty  Right  07/09/2012     Procedure: CRANIOPLASTY; Surgeon: Carmela Hurt, MD; Location: [facility]; Service: Neurosurgery; Laterality: Right; Cranioplasty with bone retrieval from abdominal pocket    Assessment & Plan  Clinical Impression: Olivia Payne is a 42 y.o. female with history of DM with peripheral neuropathy, HTN, headaches and left visual deficits with workup revealing ruptured intraocular aneurysm and posterotemporal AVM treated with cerebral embolization and craniotomy with resection and placement of bone flap in abdomen on 05/26/12. She was admitted on 07/09/12 for cranioplasty by Dr Franky Macho. PT/OT evaluations done and patient with moderate weakness, decreased balance and activity tolerance. Patient transferred to CIR on 07/16/2012.   Pt presents with increased anxiety, decreased  activity tolerance, decreased functional mobility, decreased balance, left inattention, R knee pain Limiting pt's independence with leisure/community pursuits.  Leisure History/Participation Premorbid leisure interest/current participation: Games - Word-search;Games - Jig-saw puzzles;Community - Other (Comment) (out to eat with mom) Other Leisure Interests: Radio producer Resources: Poor-identify 1 post discharge leisure resource Psychosocial / Spiritual Social interaction - Mood/Behavior: Other (Comment) (anxious) Recreational Therapy Orientation Orientation -Reviewed with patient: Available activity resources Strengths/Weaknesses Patient Strengths/Abilities: Willingness to participate Patient weaknesses: Minimal Premorbid Leisure Activity  Plan Rec Therapy Plan Is patient appropriate for  Therapeutic Recreation?: Yes Rehab Potential: Good Treatment times per week: Min 1 time per week >20 minutes Estimated Length of Stay: 2 weeks TR Treatment/Interventions: Adaptive equipment instruction;1:1 session;Balance/vestibular training;Cognitive remediation/compensation;Functional mobility training;Patient/family education;Leisure education;Therapeutic activities Recommendations for other services: Neuropsych  Recommendations for other services: Neuropsych  Discharge Criteria: Patient will be discharged from TR if patient refuses treatment 3 consecutive times without medical reason.  If treatment goals not met, if there is a change in medical status, if patient makes no progress towards goals or if patient is discharged from hospital.  The above assessment, treatment plan, treatment alternatives and goals were discussed and mutually agreed upon: by patient  Deniah Saia 07/22/2012, 4:22 PM

## 2012-07-22 NOTE — Progress Notes (Addendum)
Physical Therapy Weekly Progress Note  Patient Details  Name: TRANESHA LESSNER MRN: [id number] Date of Birth: 1970/09/30  Today's Date: 07/22/2012 Time: 4098-1191 Time Calculation (min): 45 min  Patient has met 3 of 4 short term goals.  Due to pt's obesity and LLE instability, stairs have not been a focus of treatment to date.  Patient continues to demonstrate the following deficits: muscle timing and sequencing, balance, awareness, activity tolerance, dependance for functional mobility, and therefore will continue to benefit from skilled PT intervention to enhance overall performance with activity tolerance, balance, ability to compensate for deficits, functional use of  left lower extremity, attention, awareness and coordination.  Patient progressing toward long term goals..  Continue plan of care.  PT Short Term Goals Week 1:  PT Short Term Goal 1 (Week 1): Pt will perform bed<>chair transfers with S PT Short Term Goal 1 - Progress (Week 1): Met PT Short Term Goal 2 (Week 1): Pt will ambulate 44' with Mod A PT Short Term Goal 2 - Progress (Week 1): Met PT Short Term Goal 3 (Week 1): Pt will ascend/descend 6 stairs with bil rails and Min A PT Short Term Goal 3 - Progress (Week 1): Partly met PT Short Term Goal 4 (Week 1): Pt will propell WC x150' with S in controlled environment PT Short Term Goal 4 - Progress (Week 1): Met  Skilled Therapeutic Interventions/Progress Updates:  Treatment focused on w/c mobility and neuromuscular re-education via demo, manual cues, VCs, visual feedback for LLE swing and stance phase components of gait, sustained muscle activation for standing endurance.  W/c propulsion using bil UEs x 150' with supervision.  Pt demonstrates some impulsivity with w/c parts management, and mild L inattention.  Gait with RW x 24', x 38' with ACE on L knee to help control stance instability with fatigue, min assist and min VCS except for 1 instance of mod assist needed for LOB  backwards during L LE stance.  Velocity is extremely slow, but pt gets anxious if attempting to increase speed.  As pt fatigued, she needed mod cues for sequencing. Standing endurance at rolling table , with ACE on L knee, x 4 min 30 sec while performing fine motor tasks with bil UEs, with occasional min assist to shift wt to LLE for more = wt bearing.    Discussed ex program for pt to do in her room; pt interested in bil bridging and bil lower trunk rotation.  Plan to issue hand out and instruct pt in appropriate performance .    Therapy Documentation Precautions:  Precautions Precautions: Fall Precaution Comments: LEs buckle, L hemianopsia and central visual deficits.  Restrictions Weight Bearing Restrictions: No   Pain: Pain Assessment Pain Assessment: No/denies pain Pain Score: 0-No pain   Locomotion : Ambulation Ambulation/Gait Assistance: 3: Mod assist Wheelchair Mobility Distance: 150    Other Treatments: Treatments Neuromuscular Facilitation: Left;Activity to increase motor control;Activity to increase timing and sequencing;Activity to increase grading;Activity to increase anterior-posterior weight shifting;Activity to increase sustained activation  See FIM for current functional status  Therapy/Group: Individual Therapy  Rhianne Soman 07/22/2012, 2:36 PM

## 2012-07-22 NOTE — Progress Notes (Signed)
Speech Language Pathology Daily Session Note  Patient Details  Name: Olivia Payne MRN: [id number] Date of Birth: 1970/07/04  Today's Date: 07/22/2012 Time: 1115-1200 Time Calculation (min): 45 min  Short Term Goals: Week 1: SLP Short Term Goal 1 (Week 1): Pt will demonstrate sustained attention for at least 10 minutes during a functional task with minimal environmental distractions and minimal cues from SLP. SLP Short Term Goal 1 - Progress (Week 1):  (New goal 05/17) SLP Short Term Goal 2 (Week 1): Pt will exhibit adequate medication management with modified independence. SLP Short Term Goal 2 - Progress (Week 1):  (New goal 05/17) SLP Short Term Goal 3 (Week 1): Pt will utilize external memory aids to recall new, daily information with minimal cues from SLP. SLP Short Term Goal 3 - Progress (Week 1):  (New goal 05/17)  Skilled Therapeutic Interventions: Skilled treatment session focused on addressing cognitive-linguistic goals.  SLP facilitated session with sequencing tasks that required patient to receptively and expressively sequence 4-step tasks with Min assist question cues to self-monitor and correct errors.  SLP also facilitated session by educating patient and regarding home carryover/program tasks that she could successfully perform on her own; as well as use of external aids to assist with recall of daily information.     FIM:  Comprehension Comprehension Mode: Auditory Comprehension: 5-Follows basic conversation/direction: With extra time/assistive device Expression Expression Mode: Verbal Expression: 5-Expresses basic needs/ideas: With extra time/assistive device Social Interaction Social Interaction: 6-Interacts appropriately with others with medication or extra time (anti-anxiety, antidepressant). Problem Solving Problem Solving: 4-Solves basic 75 - 89% of the time/requires cueing 10 - 24% of the time Memory Memory: 4-Recognizes or recalls 75 - 89% of the time/requires  cueing 10 - 24% of the time  Pain Pain Assessment Pain Assessment: No/denies pain Pain Score: 0-No pain  Therapy/Group: Individual Therapy  [name]., CCC-SLP [phone]  Dorotea Hand 07/22/2012, 12:41 PM

## 2012-07-22 NOTE — Progress Notes (Signed)
Occupational Therapy Session Note  Patient Details  Name: Olivia Payne MRN: [id number] Date of Birth: 25-Jul-1970  Today's Date: 07/22/2012 Time: 0905-1005 Time Calculation (min): 60 min  Short Term Goals: Week 1:  OT Short Term Goal 1 (Week 1): Pt. will will demonstrate  visual accuracy with scanning on word search game OT Short Term Goal 2 (Week 1): Pt. will utilzed AE PRN for LB bathing and dressing  OT Short Term Goal 3 (Week 1): Pt.  will stand for 5 miutes during grooming with no rest break OT Short Term Goal 4 (Week 1): Pt. will transfer to toilet with minimal assist      Skilled Therapeutic Interventions/Progress Updates:      Pt seen for BADL retraining of toileting, bathing, and dressing with a focus on functional mobility, sit to stand, and standing balance. Pt was in w/c at start of session and opted to complete a stand pivot transfer w/c to shower versus using the walker. She was then able to shower and dress with set up/supervision. She maintained balance with LB dressing with only using her left hand on wall to steady herself occasionally.  At sink she was able to stand for 3 minutes to complete grooming tasks. She stands with feet externally rotated and states that it is painful to her knees to stand with knees in alignment.  Overall, good activity tolerance and static standing balance today. Pt resting in w/c at end of session with call light in reach.  Therapy Documentation Precautions:  Precautions Precautions: Fall Precaution Comments: LEs buckle, L hemianopsia and central visual deficits.  Restrictions Weight Bearing Restrictions: No  Pain: Pain Assessment Pain Assessment: No/denies pain ADL:  See FIM for current functional status  Therapy/Group: Individual Therapy  SAGUIER,JULIA 07/22/2012, 10:21 AM

## 2012-07-22 NOTE — Progress Notes (Signed)
Brief Nutrition Note  Patient remains on a CHO-modified diet. Diet education was provided on admission to rehab unit. PO intake is excellent. No further nutrition intervention needed at this time. Please consult RD if nutrition concerns arise.  Joaquin Courts, RD, LDN, CNSC Pager [phone] After Hours Pager [phone]

## 2012-07-22 NOTE — Progress Notes (Signed)
Occupational Therapy Session Note  Patient Details  Name: Olivia Payne MRN: [id number] Date of Birth: 1970-07-25  Today's Date: 07/22/2012 1020-1100 - 40 Minutes Individual Therapy No complaints of pain  Short Term Goals: Week 1:  OT Short Term Goal 1 (Week 1): Pt. will will demonstrate  visual accuracy with scanning on word search game OT Short Term Goal 2 (Week 1): Pt. will utilzed AE PRN for LB bathing and dressing  OT Short Term Goal 3 (Week 1): Pt.  will stand for 5 miutes during grooming with no rest break OT Short Term Goal 4 (Week 1): Pt. will transfer to toilet with minimal assist  Skilled Therapeutic Interventions/Progress Updates:  Patient found seated in w/c upon entering room. Patient propelled self from room -> therapy gym. Patient transferred onto therapy mat from w/c, patient with unsafe transfer and impulsive behaviors during transfer (forgetting to lock breaks and unsafe set-up of w/c). Patient able to perform squat pivot transfer with steady assist. While in gym, focused skilled intervention on sit<>stands, dynamic standing balance/tolerance/endurance, PNF techniques during standing, short-term memory recall (telling patient 3 words and having her recall the words immediately, then a few minutes later), and overall activity tolerance/endurance. Patient required extra time and min verbal cues for memory. Afterwards, therapist propelled patient -> hospital gift shop and while there named 4 items for her to remember for another therapy session (balloon, candy, card, figurine). Patient independently able to come up with compensatory strategies to remember items in order to recall them in a later session. At end of session, left patient seated in w/c with call bell & phone within reach.   Precautions:  Precautions Precautions: Fall Precaution Comments: LEs buckle, L hemianopsia and central visual deficits.  Restrictions Weight Bearing Restrictions: No  See FIM for current  functional status  Therapy/Group: Individual Therapy  [name] 07/22/2012, 2:58 PM

## 2012-07-23 ENCOUNTER — Inpatient Hospital Stay (HOSPITAL_COMMUNITY): Payer: Managed Care, Other (non HMO)

## 2012-07-23 ENCOUNTER — Inpatient Hospital Stay (HOSPITAL_COMMUNITY): Payer: Managed Care, Other (non HMO) | Admitting: Occupational Therapy

## 2012-07-23 ENCOUNTER — Inpatient Hospital Stay (HOSPITAL_COMMUNITY): Payer: Managed Care, Other (non HMO) | Admitting: Speech Pathology

## 2012-07-23 DIAGNOSIS — Q048 Other specified congenital malformations of brain: Secondary | ICD-10-CM

## 2012-07-23 LAB — GLUCOSE, CAPILLARY: Glucose-Capillary: 164 mg/dL — ABNORMAL HIGH (ref 70–99)

## 2012-07-23 NOTE — Progress Notes (Signed)
Occupational Therapy Session Note  Patient Details  Name: Olivia Payne MRN: [id number] Date of Birth: 1970/04/04  Today's Date: 07/23/2012 Time: 1410-1440 Time Calculation (min): 30 min  Short Term Goals: Week 1:  OT Short Term Goal 1 (Week 1): Pt. will will demonstrate  visual accuracy with scanning on word search game OT Short Term Goal 2 (Week 1): Pt. will utilzed AE PRN for LB bathing and dressing  OT Short Term Goal 3 (Week 1): Pt.  will stand for 5 miutes during grooming with no rest break OT Short Term Goal 4 (Week 1): Pt. will transfer to toilet with minimal assist  Skilled Therapeutic Interventions/Progress Updates:  Individual Therapy Pt with no c/o pain.  Upon entering room, pt seated in W/C with nrsg administering meds. Pt propelled self in W/C to rehab gym (~100 ft) using bilat UE's.  Skilled intervention focused on UE strength/endurance, visual scanning/focusing, hand-eye coordination, dynamic sitting balance, and memory/recall.  Pt engaged in activity @ W/C level that involved scanning for objects drawn on a mirror, reaching to touch the outline of the object, and tracing the outline of object.  Pt then asked to close eyes & recall @ least 3 objects on the mirror.  Pt able to recall 3+ objects. Next, pt's caregiver wheeled pt down to hospital gift shop where she was instructed to recall four items she was asked to remember yesterday. Pt recalled 4/4 items.  OT instructed pt & caregiver on W/C mobility in and out of the elevator. At end of tx session, caregiver pushing pt back to pt's room from nrsg station.  Therapy Documentation Precautions:  Precautions Precautions: Fall Precaution Comments: LEs buckle, L hemianopsia and central visual deficits.  Restrictions Weight Bearing Restrictions: No  See FIM for current functional status  Therapy/Group: Individual Therapy  Roderic Palau 07/23/2012, 2:59 PM

## 2012-07-23 NOTE — Progress Notes (Signed)
Social Work Patient ID: Olivia Payne, female   DOB: [birth year], 42 y.o.   MRN: [id number] Spoke with Carla[employer] who has authorized insurance coverage through 5/29 with update then for further days until 6/3.

## 2012-07-23 NOTE — Progress Notes (Signed)
Note reviewed and accurately reflects treatment session.   

## 2012-07-23 NOTE — Progress Notes (Addendum)
Inpatient RehabilitationTeam Conference and Plan of Care Update Date: 07/21/2012   Time: 10:30 AM    Patient Name: Olivia Payne      Medical Record Number: [id number]  Date of Birth: Jan 16, 1971 Sex: Female         Room/Bed: 4153/4153-01 Payor Info: Payor: Monia Pouch / Plan: Derrek Gu / Product Type: *No Product type* /    Admitting Diagnosis: CRANIOPLASTY  CVA  Admit Date/Time:  07/16/2012  5:56 PM Admission Comments: No comment available   Primary Diagnosis:  AVM (arteriovenous malformation) brain Principal Problem: AVM (arteriovenous malformation) brain  Patient Active Problem List   Diagnosis Date Noted  . Morbid obesity 07/19/2012  . AVM (arteriovenous malformation) brain 05/26/2012  . Anxiety state, unspecified 08/01/2011  . Type II or unspecified type diabetes mellitus with ophthalmic manifestations, not stated as uncontrolled(250.50) 08/01/2011  . Hypothyroidism 08/01/2011  . Vitamin D deficiency 08/01/2011  . Weight gain 08/01/2011    Expected Discharge Date:  08/03/12  Team Members Present:   Dr. Wynn Banker, Kriste Basque Dupree SW, Melanee Spry RN, [facility] Perkinson OT, Fae Pippin ST, [place] PT, Tora Duck PPS Coordinator, Rosalio Loud OT, Perlie Mayo PT, Wanda Plump PT, Bretta Bang Supervisor, Bayard Hugger Director, Gregor Hams.        Current Status/Progress Goal Weekly Team Focus  Medical   Headaches improved, patient focusing on right knee pain.  Improved right knee weightbearing tolerance  Initiate diclofenac gel, records from orthopedics   Bowel/Bladder   cont of B/B.  Will remain cont of B/B.  remain cont of B/B.   Swallow/Nutrition/ Hydration             ADL's   Overall min assist to supervision  Overall Supervision and Mod I for toilet transfers  activity tolerance, pain management, standing tolerance and standing balance, patient/caregiver education   Mobility   mod assist amb 15 ft with RW due to right knee pain         Communication   Min  assist  Mod I   increase use and carryover of compensatory srategies   Safety/Cognition/ Behavioral Observations  Mod assist   Mod I   increase use of recall and organizational strategeis    Pain   C/o headaches. Scheduled ultram 100mg  po TID. Oxycodone 5-15mg  po prn q 4hrs.Toradol 10mg  po q 12 hrs as needed for headaches.  less than equal to 3.  will continue to monitor assess for pain medicate as needed.   Skin   bruising to bilateral arms. redness to abdominal folds microguard powder in use. healed crani to right side of head open to air   remain free of skin infection/breakdown.  will assess monitor skin q shift       *See Care Plan and progress notes for long and short-term goals.  Barriers to Discharge: Activity tolerance, morbid obesity    Possible Resolutions to Barriers:  See above, gradual reconditioning    Discharge Planning/Teaching Needs:  Home with husband for three weeks, then Mom to stay with but can not provide physical care      Team Discussion:  Limited by focus on knee pain  Revisions to Treatment Plan:  Plan Neuropsych eval   Continued Need for Acute Rehabilitation Level of Care: The patient requires daily medical management by a physician with specialized training in physical medicine and rehabilitation for the following conditions: Daily direction of a multidisciplinary physical rehabilitation program to ensure safe treatment while eliciting the highest outcome that is of practical  value to the patient.: Yes Daily medical management of patient stability for increased activity during participation in an intensive rehabilitation regime.: Yes Daily analysis of laboratory values and/or radiology reports with any subsequent need for medication adjustment of medical intervention for : Neurological problems;Other  Brock Ra 07/23/2012, 3:37 PM

## 2012-07-23 NOTE — Progress Notes (Signed)
Physical Therapy Session Note  Patient Details  Name: Olivia Payne MRN: [id number] Date of Birth: 01-01-1971  Today's Date: 07/23/2012 Time: 1000-1057 Time Calculation (min): 57 min  Short Term Goals: Week 1:  PT Short Term Goal 1 (Week 1): Pt will perform bed<>chair transfers with S PT Short Term Goal 1 - Progress (Week 1): Met PT Short Term Goal 2 (Week 1): Pt will ambulate 19' with Mod A PT Short Term Goal 2 - Progress (Week 1): Met PT Short Term Goal 3 (Week 1): Pt will ascend/descend 6 stairs with bil rails and Min A PT Short Term Goal 3 - Progress (Week 1): Partly met PT Short Term Goal 4 (Week 1): Pt will propell WC x150' with S in controlled environment PT Short Term Goal 4 - Progress (Week 1): Met  Skilled Therapeutic Interventions/Progress Updates:  Patient standing at sink brushing teeth upon entering room. Patient propelled wheelchair 180 feet with bilateral UE's with supervision on level tile. Patient transferred wheelchair to mat stand pivot with rolling walker and supervision. Patient supine<> sit independently. Patient performed LE exercise supine x 10 reps each of bridging, trunk rotation, heel slides, and hip abduction. Patient performed LAQ's x 10 bilaterally in sitting. Patient ambulated up and down 3 steps with rail on right with mod assist. Patient ambulated with rolling walker 25 feet x 1 and 30 feet x 1 with RW and ace wrap on left knee for support. Patient propelled wheelchair 150 feet back to room. Patient left in wheelchair in room with all items in reach.  Therapy Documentation Precautions:  Precautions Precautions: Fall Precaution Comments: LEs buckle, L hemianopsia and central visual deficits.  Restrictions Weight Bearing Restrictions: No Pain: Pain Assessment Pain Assessment: No/denies pain Locomotion : Ambulation Ambulation/Gait Assistance: 4: Min assist Wheelchair Mobility Distance: 180   See FIM for current functional status  Therapy/Group:  Individual Therapy  Arelia Longest M 07/23/2012, 11:46 AM

## 2012-07-23 NOTE — Progress Notes (Signed)
Speech Language Pathology Weekly Progress Note  Patient Details  Name: Olivia Payne MRN: [id number] Date of Birth: 1970-03-28  Today's Date: 07/23/2012  Short Term Goals: Week 1: SLP Short Term Goal 1 (Week 1): Pt will demonstrate sustained attention for at least 10 minutes during a functional task with minimal environmental distractions and minimal cues from SLP. SLP Short Term Goal 1 - Progress (Week 1): Met SLP Short Term Goal 2 (Week 1): Pt will exhibit adequate medication management with modified independence. SLP Short Term Goal 2 - Progress (Week 1): Progressing toward goal SLP Short Term Goal 3 (Week 1): Pt will utilize external memory aids to recall new, daily information with minimal cues from SLP. SLP Short Term Goal 3 - Progress (Week 1): Progressing toward goal Week 2: SLP Short Term Goal 1 (Week 2): Pt will demonstrate selective attention for at least 10 minutes during a functional task with Min clinician cues for redirection SLP Short Term Goal 2 (Week 2): Pt will utilize external memory aids to recall new, daily information with Min cues from SLP. SLP Short Term Goal 3 (Week 2): Pt will utilize working memory strategies during functional tasks with Engineer, maintenance cues. SLP Short Term Goal 4 (Week 2): Pt will demonstrate complex problem solving with Supervision verbal cues. SLP Short Term Goal 5 (Week 2): Pt will utilize word finding strategies in conversation with Modified Independence.  Weekly Progress Updates: Patient met 1 out of 3 short term goals this reporting period due to gains in sustained attention.  Patient also made gains in word finding, recall and problem solving; however, she continues to require significant cues to perform basic self-care tasks as a result of these deficits.  As a result, it is recommended that she continues to receive skilled SLP services to address these deficits, maximize functional independence and reduce burden of care prior to discharge home  with 24/7 family supervision.     SLP Intensity: Minumum of 1-2 x/day, 30 to 90 minutes SLP Frequency: 5 out of 7 days SLP Duration/Estimated Length of Stay: 1 week SLP Treatment/Interventions: Cognitive remediation/compensation;Cueing hierarchy;Environmental controls;Functional tasks;Internal/external aids;Medication managment;Patient/family education;Speech/Language facilitation;Therapeutic Activities  [name]., CCC-SLP [phone]  Caitlyne Ingham 07/23/2012, 4:33 PM

## 2012-07-23 NOTE — Consult Note (Signed)
NEUROCOGNITIVE TESTING - [facility]   Ms. Olivia Payne is a 42 year old, right-handed, Caucasian woman who was seen for a brief neuropsychological assessment to evaluate emotional and cognitive functioning post-AVM with craniotomy and cranioplasty.  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  It was treated with cerebral embolization and craniotomy on 05/26/12 and cranioplasty on 07/09/12.  Subjectively, Ms. Olivia Payne described trouble with short-term memory, concentration, decision-making, and word-finding trouble.  She stated that these problems became evident in July, 2013 and that in August, 2013 she experienced a severe depressive episode.  She denied current depression, but repeatedly mentioned having symptoms of anxious mood and was tearful when discussing her fears about returning to work, as she is concerned that she will not be able to keep up with work responsibilities; particularly multi-tasking, organization, and memory.    PROCEDURES: [3 units of 16109 on 07/22/12]  The following tests were performed during today's visit: Mini Mental Status Examination - 2 (brief version), Repeatable Battery for the Assessment of Neuropsychological Status (RBANS, form A), Beck Anxiety Inventory, and the Geriatric Beck Depression Inventory-II.  Test results are as follows:   MMSE-2 (brief) Raw Score = 9/16 Description = Impaired   RBANS Indices Scaled Score Percentile Description  Immediate Memory  78 7 Impaired  Visuospatial/Constructional 62 1 Profoundly Impaired  Language 95 37 Average  Attention 79 8 Impaired  Delayed Memory 83 13 Below Average  Total Score 74 4 Impaired   RBANS Subtests Raw Score Percentile Description  List Learning 18 1 Profoundly Impaired  Story Memory 16 39 Average  Figure Copy 15 1 Profoundly Impaired  Line Orientation  10 4 Impaired  Picture Naming 10 70 Average  Semantic Fluency 18 27 Average  Digit Span 11 55 Average  Coding 21 < 1 Profoundly Impaired  List Recall 4 10 Below Average  List Recognition 20 70 Average  Story Recall 7 14 Below Average  Figure recall 7 2 Impaired   Beck Depression Inventory-2 Raw Score = 9 Description = WNL   Beck Anxiety Inventory Raw Score = 6 Description = WNL   Test results revealed deficits in certain aspects of learning and memory, with relatively preserved ability to learn and recall contextually-based information; visuospatial functioning, including complex visual organization (neglecting to include major elements of a copied figure) and difficulty making fine visual distinctions (gross errors at times); and poor complex attention, though it is notable that this task also has a significant visual component.  Her responses to self-report measures of mood symptoms were not suggestive of the presence of significant depression or anxiety at this time.  However, as mentioned above, she reported significant anxiety associated with cognitive difficulties and how that may affect her ability return to work.  Additionally, during the evaluation, she demonstrated highly anxious behavior at times.  For example, before certain memory tasks, she would say she needed to take a breath, but her breaths were short and labored.  Additionally, during an attention task in which she was asked to repeat digits in a series, she looked frantically around the room while completing the task.  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    Owing to the significant stress of adjusting to her current medical condition, as well as observed anxious mood, it is possible that stress and anxiety are significantly impacting certain aspects of her  cognitive functioning.  However, there is also likely impact from her AVM and subsequent  surgeries; particularly in terms of visuospatial deficits that were seen.  The tests that were administered today are screening measures that were indicative of the presence of significant cognitive impairment in certain areas.  However, Ms. Olivia Payne would likely benefit from participating in a comprehensive neuropsychological evaluation as an outpatient immediately following discharge to help obtain a better picture of her current cognitive abilities and to help her physicians regarding treatment planning and providing recommendations for returning to work.    In light of these findings, the following recommendations are provided.    RECOMMENDATIONS:  Recommendations for treatment team:     When interacting with Ms. Olivia Payne, directions and information should be provided in a simple, straight forward manner, and the treatment team should avoid giving multiple instructions simultaneously.    Ms. Olivia Payne memory was significantly improved when information was framed within a context.  As such, those interacting with her should strive to provide contextual details or make up a story, whenever possible, to accompany important details in order to improve her ability to recall the information later.     Ms. Olivia Payne may also benefit from being provided with multiple trials to learn new skills given the noted memory inefficiencies.    To the extent possible, multitasking should be avoided.   Performance will generally be best in a structured, routine, and familiar environment, as opposed to situations involving complex problems.   Recommendations for discharge planning:    Complete a comprehensive neuropsychological evaluation immediately following discharge to assist in treatment planning and to help provide recommendations regarding her ability to return to work.  Ms. Olivia Payne was agreeable to completing this evaluation with Dr. Wylene Simmer.  Her contact information should be provided in her discharge paperwork.     Maintain  engagement in mentally, physically and cognitively stimulating activities.    Strive to maintain a healthy lifestyle (e.g., proper diet and exercise) in order to promote physical, cognitive and emotional health.    Leavy Cella, Psy.D.  Clinical Neuropsychologist

## 2012-07-23 NOTE — Progress Notes (Signed)
Speech Language Pathology Daily Session Note  Patient Details  Name: Olivia Payne MRN: [id number] Date of Birth: 07-08-1970  Today's Date: 07/23/2012 Time: 1120-1205 Time Calculation (min): 45 min  Short Term Goals: Week 1: SLP Short Term Goal 1 (Week 1): Pt will demonstrate sustained attention for at least 10 minutes during a functional task with minimal environmental distractions and minimal cues from SLP. SLP Short Term Goal 1 - Progress (Week 1):  (New goal 05/17) SLP Short Term Goal 2 (Week 1): Pt will exhibit adequate medication management with modified independence. SLP Short Term Goal 2 - Progress (Week 1):  (New goal 05/17) SLP Short Term Goal 3 (Week 1): Pt will utilize external memory aids to recall new, daily information with minimal cues from SLP. SLP Short Term Goal 3 - Progress (Week 1):  (New goal 05/17)  Skilled Therapeutic Interventions: Skilled treatment session focused on addressing cognitive-linguistic goals.  SLP facilitated session with new learning and working memory task that required patient to alternate attention between three different variables with Min assist verbal cues to recall and follow procedures.  SLP also facilitated session with categorical word finding tasks with Supervision level verbal cues to utilize strategies.     FIM:  Comprehension Comprehension Mode: Auditory Comprehension: 5-Follows basic conversation/direction: With extra time/assistive device Expression Expression Mode: Verbal Expression: 5-Expresses basic needs/ideas: With extra time/assistive device Social Interaction Social Interaction: 6-Interacts appropriately with others with medication or extra time (anti-anxiety, antidepressant). Problem Solving Problem Solving: 4-Solves basic 75 - 89% of the time/requires cueing 10 - 24% of the time Memory Memory: 4-Recognizes or recalls 75 - 89% of the time/requires cueing 10 - 24% of the time  Pain Pain Assessment Pain Assessment:  No/denies pain  Therapy/Group: Individual Therapy  [name]., CCC-SLP [phone]  Clifton Safley 07/23/2012, 12:27 PM

## 2012-07-23 NOTE — Progress Notes (Signed)
Patient ID: Olivia Payne, female   DOB: 1970/08/24, 42 y.o.   MRN: [id number] Subjective/Complaints:  No complaints today. Pain is improving except for when she "over does it." pleased with progress. A 12 point review of systems has been performed and if not noted above is otherwise negative.   Objective: Vital Signs: Blood pressure 93/58, pulse 82, temperature 98.4 F (36.9 C), temperature source Oral, resp. rate 18, weight 146.7 kg (323 lb 6.6 oz), last menstrual period 07/07/1992, SpO2 95.00%. No results found. No results found for this basename: WBC, HGB, HCT, PLT,  in the last 72 hours No results found for this basename: NA, K, CL, CO, GLUCOSE, BUN, CREATININE, CALCIUM,  in the last 72 hours CBG (last 3)   Recent Labs  07/22/12 1637 07/22/12 2110 07/23/12 0714  GLUCAP 118* 121* 164*    Wt Readings from Last 3 Encounters:  07/21/12 146.7 kg (323 lb 6.6 oz)  07/11/12 172.6 kg (380 lb 8.2 oz)  07/11/12 172.6 kg (380 lb 8.2 oz)    Physical Exam:  Morbidly obese.  HENT:  Head: Normocephalic and atraumatic.  Eyes: EOM are normal. Pupils are equal, round, and reactive to light.  Neck: No tracheal deviation present. No thyromegaly present.  Cardiovascular: Normal rate and regular rhythm.  Pulmonary/Chest: Effort normal and breath sounds normal.  Abdominal: Soft. Bowel sounds are normal. She exhibits no distension. There is no tenderness.  Musculoskeletal: She exhibits edema.  Valgus and ER of right knee, tender to touch with mild effusion.  Lymphadenopathy:  She has no cervical adenopathy.  Neurological: just awakening. A little slow to arouse but answers all my questions May have impaired vision in central fields. Has inattention throughout the left visual however. 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 Impaired sitting balance when distracted but stable at rest. . Strength 4/5 RUE and 4- LUE. LE strength inhibited by knee pain ,right more than left.  DTR's 1+, sensation grossly intact in all 4.  Skin:  Right scalp incision clean and well approximated with suture. Mild scab, dried blood around incision   Assessment/Plan: 1. Functional deficits secondary to AVM s/p repair and eventual cranioplasty with ongoing pain,gait,cognitive issues which require 3+ hours per day of interdisciplinary therapy in a comprehensive inpatient rehab setting. Physiatrist is providing close team supervision and 24 hour management of active medical problems listed below. Physiatrist and rehab team continue to assess barriers to discharge/monitor patient progress toward functional and medical goals.    FIM: FIM - Bathing Bathing Steps Patient Completed: Chest;Right Arm;Left Arm;Abdomen;Front perineal area;Buttocks;Right upper leg;Left upper leg;Right lower leg (including foot);Left lower leg (including foot) Bathing: 4: Steadying assist  FIM - Upper Body Dressing/Undressing Upper body dressing/undressing steps patient completed: Thread/unthread right bra strap;Thread/unthread left bra strap;Thread/unthread left sleeve of pullover shirt/dress;Hook/unhook bra;Thread/unthread right sleeve of pullover shirt/dresss;Put head through opening of pull over shirt/dress;Pull shirt over trunk Upper body dressing/undressing: 5: Set-up assist to: Obtain clothing/put away FIM - Lower Body Dressing/Undressing Lower body dressing/undressing steps patient completed: Thread/unthread right underwear leg;Thread/unthread left underwear leg;Pull underwear up/down;Thread/unthread right pants leg;Thread/unthread left pants leg;Pull pants up/down;Don/Doff right sock;Don/Doff left sock Lower body dressing/undressing: 5: Supervision: Safety issues/verbal cues  FIM - Toileting Toileting steps completed by patient: Adjust clothing prior to toileting;Performs perineal hygiene;Adjust clothing after toileting Toileting Assistive Devices: Grab bar or rail for support Toileting: 4: Steadying  assist  FIM - Diplomatic Services operational officer Devices: Grab bars Toilet Transfers: 4-To toilet/BSC: Min A (steadying Pt. >  75%);4-From toilet/BSC: Min A (steadying Pt. > 75%)  FIM - Bed/Chair Transfer Bed/Chair Transfer Assistive Devices: Arm rests Bed/Chair Transfer: 0: Activity did not occur  FIM - Locomotion: Wheelchair Distance: 150 Locomotion: Wheelchair: 5: Travels 150 ft or more: maneuvers on rugs and over door sills with supervision, cueing or coaxing FIM - Locomotion: Ambulation Locomotion: Ambulation Assistive Devices: Designer, industrial/product Ambulation/Gait Assistance: 3: Mod assist Locomotion: Ambulation: 1: Travels less than 50 ft with moderate assistance (Pt: 50 - 74%) (38')  Comprehension Comprehension Mode: Auditory Comprehension: 5-Follows basic conversation/direction: With extra time/assistive device  Expression Expression Mode: Verbal Expression: 5-Expresses basic needs/ideas: With extra time/assistive device  Social Interaction Social Interaction: 6-Interacts appropriately with others with medication or extra time (anti-anxiety, antidepressant).  Problem Solving Problem Solving: 4-Solves basic 75 - 89% of the time/requires cueing 10 - 24% of the time  Memory Memory: 4-Recognizes or recalls 75 - 89% of the time/requires cueing 10 - 24% of the time  Medical Problem List and Plan:  1. DVT Prophylaxis/Anticoagulation: Pharmaceutical: Lovenox.  2. Facial pain/chronic back pain/chronic right knee pain/ headaches: Will continue topamax and lyrica for facial pain/headache. . AddedVoltaren gel to help with knee pain. Ok to use icy hot patches to back daily.   - toradol available on a prn basis  -overall pain/headaches are much improved 3. Anxiety/Depression: continue cymbalta. Will need a lot of ego support by team. Seems anxious and distracted by multiple somatic complaints and poor insight/awareness.  4. Neuropsych: This patient is capable of making  decisions on her own behalf.  5. DM type 2: Will monitor with AC/HS cbg checks. Continue metformin    -improving control--no changes today. 6. HTN: Will monitor with bid checks.  7. Morbid obesity:   educate patient regarding weight loss/ diet. Routine pressure relief measures 8.  R knee pain, suspect soft tissue, no instability to exam, expect improvement with time and therapy LOS (Days) 7 A FACE TO FACE EVALUATION WAS PERFORMED  SWARTZ,ZACHARY T 07/23/2012 9:25 AM

## 2012-07-23 NOTE — Progress Notes (Signed)
Occupational Therapy Session Note  Patient Details  Name: Olivia Payne MRN: [id number] Date of Birth: 1971-02-23  Today's Date: 07/23/2012 Time: [phone] Time Calculation (min): 60 min  Short Term Goals: Week 1:  OT Short Term Goal 1 (Week 1): Pt. will will demonstrate  visual accuracy with scanning on word search game OT Short Term Goal 2 (Week 1): Pt. will utilzed AE PRN for LB bathing and dressing  OT Short Term Goal 3 (Week 1): Pt.  will stand for 5 miutes during grooming with no rest break OT Short Term Goal 4 (Week 1): Pt. will transfer to toilet with minimal assist      Skilled Therapeutic Interventions/Progress Updates:      Pt seen for BADL retraining of toileting, bathing, and dressing with a focus on attention to task,  Safety awareness, sit to stand, standing balance. Pt transferred on/off toilet with stand pivot with supervision. She was setting up to transfer to shower but was talking and distracted and she forgot to lock breaks first, therapist cued her to lock breaks.  Once in shower, pt was able to complete bathing with only min cues for procedure.  Pt stated, " I cant remember how to start. Should I was my body or hair first?  I dont want my face to get wet."  She was able to continue dressing from w/c level with supervision (again to lock breaks).  She stood at sink for 6 min to complete grooming tasks. PT had arrived for her next session.  Therapy Documentation Precautions:  Precautions Precautions: Fall Precaution Comments: LEs buckle, L hemianopsia and central visual deficits.  Restrictions Weight Bearing Restrictions: No  Pain:    ADL:  See FIM for current functional status  Therapy/Group: Individual Therapy  SAGUIER,JULIA 07/23/2012, 11:41 AM

## 2012-07-24 ENCOUNTER — Inpatient Hospital Stay (HOSPITAL_COMMUNITY): Payer: Managed Care, Other (non HMO) | Admitting: *Deleted

## 2012-07-24 DIAGNOSIS — E039 Hypothyroidism, unspecified: Secondary | ICD-10-CM

## 2012-07-24 DIAGNOSIS — Q283 Other malformations of cerebral vessels: Secondary | ICD-10-CM

## 2012-07-24 DIAGNOSIS — E1139 Type 2 diabetes mellitus with other diabetic ophthalmic complication: Secondary | ICD-10-CM

## 2012-07-24 LAB — GLUCOSE, CAPILLARY
Glucose-Capillary: 181 mg/dL — ABNORMAL HIGH (ref 70–99)
Glucose-Capillary: 159 mg/dL — ABNORMAL HIGH (ref 70–99)
Glucose-Capillary: 127 mg/dL — ABNORMAL HIGH (ref 70–99)
Glucose-Capillary: 199 mg/dL — ABNORMAL HIGH (ref 70–99)

## 2012-07-24 NOTE — Progress Notes (Signed)
Occupational Therapy Note  Patient Details  Name: Olivia Payne MRN: [id number] Date of Birth: 1971/01/30 Today's Date: 07/24/2012  Time:  1520-1630  ((70 min) Pain:  4/10 shoulder pain Individual session   Addressed WC mobility, memory, therapeutic activities, strengthening, UE AROM.  Performed measurements of BUE pinch grip and gross grasp.  The following is the results:    right= lateral pinch=  8 lbs.,15 lbs,15 lbs., ; 3 jaw chuck=  6 lbs, 16lbs,15 lbs;  Pincer grasp=10# 14#, 12.5# Left:  Lateral pinch= 12.5, 13#, 13.5#;         3 jaw chuck= 12#., 15.5#, 12#;      Pincer grasp=6.5 #, 7.5#, 7.5 #     Right grip=  47#, 47#, 55#              Left grip=  50#. 35#. 45# Pt. Complained of bilateral shoulder pain with 2 # weight on right wrist while doing exercises with fingers for coordination and strength.  Left shoulder did not hurt with the same exercise.  Did myotherapy to upper traps, but pt unable to tolerate the pain.  Reviewed putty exercises and issued the orange theraputty.  Had pt recall the various exercises without written handout.  She was independent with this. Pt. Propelled wc back to room independently.       Humberto Seals 07/24/2012, 3:41 PM

## 2012-07-24 NOTE — Progress Notes (Signed)
Olivia Payne is a 42 y.o. female 02-15-1971 [id number]  Subjective: No new complaints. No new problems. Slept well. Feeling better.  Objective: Vital signs in last 24 hours: Temp:  [97.2 F (36.2 C)] 97.2 F (36.2 C) (05/24 0515) Pulse Rate:  [80-98] 98 (05/24 0515) Resp:  [17-18] 17 (05/24 0515) BP: (92-138)/(62-77) 138/77 mmHg (05/24 0515) SpO2:  [97 %-100 %] 100 % (05/24 0515) Weight change:  Last BM Date: 07/22/12  Intake/Output from previous day: 05/23 0701 - 05/24 0700 In: 600 [P.O.:600] Out: -  Last cbgs: CBG (last 3)   Recent Labs  07/23/12 1659 07/23/12 2055 07/24/12 0753  GLUCAP 143* 181* 159*     Physical Exam General: No apparent distress    HEENT: moist mucosa Lungs: Normal effort. Lungs clear to auscultation, no crackles or wheezes. Cardiovascular: Regular rate and rhythm, no edema Musculoskeletal:  No change from before Neurological: No new neurological deficits Wounds: N/A    Skin: clear Alert, cooperative   Lab Results: BMET    Component Value Date/Time   NA 140 07/19/2012 0555   K 4.2 07/19/2012 0555   CL 104 07/19/2012 0555   CO2 26 07/19/2012 0555   GLUCOSE 123* 07/19/2012 0555   BUN 20 07/19/2012 0555   CREATININE 0.87 07/19/2012 0555   CALCIUM 9.0 07/19/2012 0555   GFRNONAA 80* 07/19/2012 0555   GFRAA >90 07/19/2012 0555   CBC    Component Value Date/Time   WBC 16.2* 07/19/2012 0555   RBC 5.09 07/19/2012 0555   HGB 12.2 07/19/2012 0555   HCT 39.2 07/19/2012 0555   PLT 330 07/19/2012 0555   MCV 77.0* 07/19/2012 0555   MCH 24.0* 07/19/2012 0555   MCHC 31.1 07/19/2012 0555   RDW 17.5* 07/19/2012 0555   LYMPHSABS 3.4 07/19/2012 0555   MONOABS 1.3* 07/19/2012 0555   EOSABS 0.0 07/19/2012 0555   BASOSABS 0.0 07/19/2012 0555    Studies/Results: No results found.  Medications: I have reviewed the patient's current medications.  Assessment/Plan:  1. DVT Prophylaxis/Anticoagulation: Pharmaceutical: Lovenox.  2. Facial pain/chronic back  pain/chronic right knee pain/ headaches: Will continue topamax and lyrica for facial pain/headache. . AddedVoltaren gel to help with knee pain. Ok to use icy hot patches to back daily.  - toradol available on a prn basis  -overall pain/headaches are much improved  3. Anxiety/Depression: continue cymbalta. Will need a lot of ego support by team. Seems anxious and distracted by multiple somatic complaints and poor insight/awareness.  4. Neuropsych: This patient is capable of making decisions on her own behalf.  5. DM type 2: Will monitor with AC/HS cbg checks. Continue metformin  -improving control--no changes today.  6. HTN: Will monitor with bid checks.  7. Morbid obesity: educate patient regarding weight loss/ diet. Routine pressure relief measures  8. R knee pain, suspect soft tissue, no instability to exam, expect improvement with time and therapy     Length of stay, days: 8  Sonda Primes , MD 07/24/2012, 8:16 AM

## 2012-07-25 ENCOUNTER — Inpatient Hospital Stay (HOSPITAL_COMMUNITY): Payer: Managed Care, Other (non HMO) | Admitting: *Deleted

## 2012-07-25 DIAGNOSIS — B370 Candidal stomatitis: Secondary | ICD-10-CM

## 2012-07-25 LAB — GLUCOSE, CAPILLARY

## 2012-07-25 MED ORDER — NYSTATIN 100000 UNIT/ML MT SUSP
5.0000 mL | Freq: Four times a day (QID) | OROMUCOSAL | Status: DC
  Administered 2012-07-25 – 2012-07-28 (×10): 500000 [IU] via OROMUCOSAL
  Filled 2012-07-25 (×17): qty 5

## 2012-07-25 MED ORDER — PHENOL 1.4 % MT LIQD
1.0000 | OROMUCOSAL | Status: DC | PRN
  Administered 2012-07-25 – 2012-07-27 (×3): 1 via OROMUCOSAL
  Filled 2012-07-25: qty 177

## 2012-07-25 MED ORDER — PHENOL 1.4 % MT LIQD
1.0000 | OROMUCOSAL | Status: DC | PRN
  Filled 2012-07-25: qty 177

## 2012-07-25 NOTE — Progress Notes (Signed)
Physical Therapy Session Note  Patient Details  Name: Olivia Payne MRN: [id number] Date of Birth: 08-13-1970  Today's Date: 07/25/2012 Time: 0900-1000 Time Calculation (min): 60 min   Skilled Therapeutic Interventions/Progress Updates:  B Le exercises performed in sitting and in supine with 3 lbs on B ankles. Transfer training bed<=>w/c<=>mat with supervision , and cues to assure safety. Static standing training with B UE supported, weight shifting to increase weight bearing on L LE. Attempted gait training with RW, patient present with shuffling gait and inability to lift feet of the ground, training on weight shifting, progression and sequencing during gait, distance covered 10 feet.w/c management training included instructions and education on putting on and taking off leg rests, propulsion in the chair 2 x 176 feet with supervision, patient needed verbal cues to find her way to the gym. Patient complained about not being able to remember simple things and sequencing, she feels frustrated.     Therapy Documentation Precautions:  Precautions Precautions: Fall Precaution Comments: LEs buckle, L hemianopsia and central visual deficits.  Restrictions Weight Bearing Restrictions: No Locomotion : Ambulation Ambulation/Gait Assistance: 3: Mod assist Gait Gait: Yes Gait Pattern: Step-to pattern;Decreased stride length;Left flexed knee in stance;Shuffle Wheelchair Mobility Wheelchair Mobility: Yes Wheelchair Assistance: 5: Supervision Wheelchair Parts Management: Needs assistance Distance: 176  Balance: Cytogeneticist Standing - Balance Support: Bilateral upper extremity supported Static Standing - Level of Assistance: 4: Min assist Static Standing - Comment/# of Minutes:  (weigh shifts with suport) Exercises: General Exercises - Lower Extremity Ankle Circles/Pumps: AROM;Both;15 reps;Supine Long Arc Quad: AROM;Both;15 reps;Seated;Weights (3lbs) Hip  ABduction/ADduction: 15 reps;AROM;Both;Sidelying (with 3 lbs weights) Straight Leg Raises: AROM;15 reps;Supine (with weights)  See FIM for current functional status  Therapy/Group: Individual Therapy  Dorna Mai 07/25/2012, 11:28 AM

## 2012-07-25 NOTE — Progress Notes (Signed)
Dr. Posey Rea notified of patient's c/o sore throat by 3 days.  Order received: Chloraseptic spray prn.  May leave at at bedside.

## 2012-07-25 NOTE — Progress Notes (Signed)
Olivia Payne is a 42 y.o. female [birth year] [id number]  Subjective: C/o ST ans sore gums. Slept well. Feeling better.  Objective: Vital signs in last 24 hours: Temp:  [97.3 F (36.3 C)-97.6 F (36.4 C)] 97.3 F (36.3 C) (05/25 0524) Pulse Rate:  [95-97] 97 (05/25 0524) Resp:  [18-20] 20 (05/25 0524) BP: (103-131)/(69-87) 131/87 mmHg (05/25 0524) SpO2:  [95 %-97 %] 97 % (05/25 0524) Weight change:  Last BM Date: 07/24/12  Intake/Output from previous day: 05/24 0701 - 05/25 0700 In: 480 [P.O.:480] Out: -  Last cbgs: CBG (last 3)   Recent Labs  07/24/12 1643 07/24/12 2115 07/25/12 0720  GLUCAP 127* 199* 227*     Physical Exam General: No apparent distress    HEENT: moist mucosa and thrush Lungs: Normal effort. Lungs clear to auscultation, no crackles or wheezes. Cardiovascular: Regular rate and rhythm, no edema Musculoskeletal:  No change from before Neurological: No new neurological deficits Wounds: N/A    Skin: clear Alert, cooperative   Lab Results: BMET    Component Value Date/Time   NA 140 07/19/2012 0555   K 4.2 07/19/2012 0555   CL 104 07/19/2012 0555   CO2 26 07/19/2012 0555   GLUCOSE 123* 07/19/2012 0555   BUN 20 07/19/2012 0555   CREATININE 0.87 07/19/2012 0555   CALCIUM 9.0 07/19/2012 0555   GFRNONAA 80* 07/19/2012 0555   GFRAA >90 07/19/2012 0555   CBC    Component Value Date/Time   WBC 16.2* 07/19/2012 0555   RBC 5.09 07/19/2012 0555   HGB 12.2 07/19/2012 0555   HCT 39.2 07/19/2012 0555   PLT 330 07/19/2012 0555   MCV 77.0* 07/19/2012 0555   MCH 24.0* 07/19/2012 0555   MCHC 31.1 07/19/2012 0555   RDW 17.5* 07/19/2012 0555   LYMPHSABS 3.4 07/19/2012 0555   MONOABS 1.3* 07/19/2012 0555   EOSABS 0.0 07/19/2012 0555   BASOSABS 0.0 07/19/2012 0555    Studies/Results: No results found.  Medications: I have reviewed the patient's current medications.  Assessment/Plan:  1. DVT Prophylaxis/Anticoagulation: Pharmaceutical: Lovenox.  2. Facial  pain/chronic back pain/chronic right knee pain/ headaches: Will continue topamax and lyrica for facial pain/headache. . AddedVoltaren gel to help with knee pain. Ok to use icy hot patches to back daily.  - toradol available on a prn basis  -overall pain/headaches are much improved  3. Anxiety/Depression: continue cymbalta. Will need a lot of ego support by team. Seems anxious and distracted by multiple somatic complaints and poor insight/awareness.  4. Neuropsych: This patient is capable of making decisions on her own behalf.  5. DM type 2: Will monitor with AC/HS cbg checks. Continue metformin  -improving control--no changes today.  6. HTN: Will monitor with bid checks.  7. Morbid obesity: educate patient regarding weight loss/ diet. Routine pressure relief measures  8. R knee pain, suspect soft tissue, no instability to exam, expect improvement with time and therapy 9. Thrush - see Rx     Length of stay, days: 9  Sonda Primes , MD 07/25/2012, 8:39 AM

## 2012-07-26 ENCOUNTER — Encounter (HOSPITAL_COMMUNITY): Payer: Managed Care, Other (non HMO)

## 2012-07-26 ENCOUNTER — Inpatient Hospital Stay (HOSPITAL_COMMUNITY): Payer: Managed Care, Other (non HMO) | Admitting: Occupational Therapy

## 2012-07-26 ENCOUNTER — Inpatient Hospital Stay (HOSPITAL_COMMUNITY): Payer: Managed Care, Other (non HMO) | Admitting: Speech Pathology

## 2012-07-26 ENCOUNTER — Inpatient Hospital Stay (HOSPITAL_COMMUNITY): Payer: Managed Care, Other (non HMO) | Admitting: Physical Therapy

## 2012-07-26 LAB — GLUCOSE, CAPILLARY
Glucose-Capillary: 118 mg/dL — ABNORMAL HIGH (ref 70–99)
Glucose-Capillary: 119 mg/dL — ABNORMAL HIGH (ref 70–99)
Glucose-Capillary: 177 mg/dL — ABNORMAL HIGH (ref 70–99)
Glucose-Capillary: 159 mg/dL — ABNORMAL HIGH (ref 70–99)

## 2012-07-26 MED ORDER — METFORMIN HCL ER 500 MG PO TB24
1000.0000 mg | ORAL_TABLET | Freq: Every day | ORAL | Status: DC
  Administered 2012-07-26 – 2012-08-03 (×9): 1000 mg via ORAL
  Filled 2012-07-26 (×11): qty 2

## 2012-07-26 MED ORDER — FLUCONAZOLE 200 MG PO TABS
200.0000 mg | ORAL_TABLET | Freq: Once | ORAL | Status: AC
  Administered 2012-07-26: 200 mg via ORAL
  Filled 2012-07-26: qty 1

## 2012-07-26 NOTE — Progress Notes (Signed)
Occupational Therapy Session Note  Patient Details  Name: Olivia Payne MRN: [id number] Date of Birth: [birth year]  Today's Date: 07/26/2012 Time: 1310-1410 Time Calculation (min): 60 min   Skilled Therapeutic Interventions/Progress Updates:    1:1 focus on core, hip and LE strengthening and coordination in prep for transfers and functional ambulation. Performed bridging in supine with ball between her legs, rolling with control, modified situps, hip flexion in supine, sitting and standing, functional ambulation with normal patterns of movement with RW with increased ability to pick up left foot off floor.  Therapy Documentation Precautions:  Precautions Precautions: Fall Precaution Comments: LEs buckle, L hemianopsia and central visual deficits.  Restrictions Weight Bearing Restrictions: No Pain:  no c/o pain  See FIM for current functional status  Therapy/Group: Individual Therapy  Roney Mans [facility] 07/26/2012, 2:22 PM

## 2012-07-26 NOTE — Progress Notes (Signed)
Physical Therapy Note  Patient Details  Name: Olivia Payne MRN: [id number] Date of Birth: [birth year] Today's Date: 07/26/2012  Time: 1000-1045 45 minutes  1:1 No c/o pain.  Pt propelled w/c throughout unit and in home environment with mod I for controlled environment, supervision for home environment due to safety awareness (cues to lock brakes and for set up for transfers).  Furniture transfers to simulate home environment with close supervision.  Gait training with ace wrap for L LE stability with min A to advance L LE, tactile cues for glute and quad contraction during gait.  Manual facilitation for wt shifts and verbal cuing for sequencing due to anxiety.  Pt with B trendelenberg gait, improves with cues for glute contraction B.  Standing balance training with focus on L LE stance control with tactile cues for quad and glute contraction while tapping and stepping with R LE.  Seated AAROM for L LE knee ext, hip flexion to promote foot clearance during gait.  Pt motivated to improve throughout session.   Marijah Larranaga 07/26/2012, 10:51 AM

## 2012-07-26 NOTE — Progress Notes (Signed)
Occupational Therapy Weekly Progress Note  Patient Details  Name: Olivia Payne MRN: [id number] Date of Birth: Jan 09, 1971  Today's Date: 07/26/2012 Time: 0900-1000 Time Calculation (min): 60 min  Patient has met 4 of 4 short term goals.  Pt is progressing well with BADLs and activity tolerance.  Patient continues to demonstrate the following deficits: weakness in LEs (with knee buckling), decreased balance, decreased standing tolerance, decreased memory and therefore will continue to benefit from skilled OT intervention to enhance overall performance with BADL.  Patient progressing toward long term goals..  Continue plan of care.  OT Short Term Goals Week 1:  OT Short Term Goal 1 (Week 1): Pt. will will demonstrate  visual accuracy with scanning on word search game OT Short Term Goal 1 - Progress (Week 1): Met OT Short Term Goal 2 (Week 1): Pt. will utilzed AE PRN for LB bathing and dressing  OT Short Term Goal 2 - Progress (Week 1): Met OT Short Term Goal 3 (Week 1): Pt.  will stand for 5 miutes during grooming with no rest break OT Short Term Goal 3 - Progress (Week 1): Met OT Short Term Goal 4 (Week 1): Pt. will transfer to toilet with minimal assist OT Short Term Goal 4 - Progress (Week 1): Met Week 2:  OT Short Term Goal 1 (Week 2): Pt will transfer to toilet from w/c with mod I. OT Short Term Goal 2 (Week 2): Pt will toilet with mod I. OT Short Term Goal 3 (Week 2): Pt will transfer to shower stall with 2 inch ledge with supervision.  Skilled Therapeutic Interventions/Progress Updates:      Pt seen for BADL retraining of toileting, bathing, and dressing with a focus on attention to task, processing and problem solving, activity tolerance, functional mobility with transfers and standing balance.  Pt transferred w/c to toilet, back to w/c and also shower stall with supervision but needed mod cues to lock breaks on chair. She has a great deal of difficulty recalling to lock brakes.  She also was able to toilet and shower with distant supervision. She no longer needs AE to dress LB.  Pt tends to distract herself by being hyper verbal. At end of session, the next therapist arrived for her next session.    Therapy Documentation Precautions:  Precautions Precautions: Fall Precaution Comments: LEs buckle, L hemianopsia and central visual deficits.  Restrictions Weight Bearing Restrictions: No  Pain: Pain Assessment Pain Assessment: 0-10 Pain Score:   3 Pain Type: Chronic pain Pain Location: Knee Pain Orientation: Right Pain Descriptors: Aching;Sore Pain Onset: On-going Patients Stated Pain Goal: 3 Pain Intervention(s): Medication (See eMAR);Repositioned (scheduled ultram) Multiple Pain Sites: NoPt c/o sore throat. Nursing is aware ADL:  See FIM for current functional status  Therapy/Group: Individual Therapy  Gaberiel Youngblood 07/26/2012, 11:15 AM

## 2012-07-26 NOTE — Progress Notes (Signed)
Speech Language Pathology Daily Session Note  Patient Details  Name: Olivia Payne MRN: [id number] Date of Birth: 02-10-1971  Today's Date: 07/26/2012 Time: 4098-1191 Time Calculation (min): 30 min  Short Term Goals: Week 2: SLP Short Term Goal 1 (Week 2): Pt will demonstrate selective attention for at least 10 minutes during a functional task with Min clinician cues for redirection SLP Short Term Goal 2 (Week 2): Pt will utilize external memory aids to recall new, daily information with Min cues from SLP. SLP Short Term Goal 3 (Week 2): Pt will utilize working memory strategies during functional tasks with Engineer, maintenance cues. SLP Short Term Goal 4 (Week 2): Pt will demonstrate complex problem solving with Supervision verbal cues. SLP Short Term Goal 5 (Week 2): Pt will utilize word finding strategies in conversation with Modified Independence.  Skilled Therapeutic Interventions: Skilled treatment session focused on addressing cognitive-linguistic goals.  SLP facilitated session with new learning and working memory task that required patient to select attention during a categorical naming task with Supervision level verbal cues.  SLP also facilitated session with Min assist question and semantic cues to self-monitor and correct word finding errors during conversational level verbal expression.     FIM:  Comprehension Comprehension Mode: Auditory Comprehension: 5-Understands complex 90% of the time/Cues < 10% of the time Expression Expression Mode: Verbal Expression: 5-Expresses complex 90% of the time/cues < 10% of the time Social Interaction Social Interaction: 6-Interacts appropriately with others with medication or extra time (anti-anxiety, antidepressant). Problem Solving Problem Solving: 4-Solves basic 75 - 89% of the time/requires cueing 10 - 24% of the time Memory Memory: 5-Recognizes or recalls 90% of the time/requires cueing < 10% of the time  Pain Pain Assessment Pain  Assessment: No/denies pain  Therapy/Group: Individual Therapy  [name]., CCC-SLP [phone]  [name] 07/26/2012, 3:34 PM

## 2012-07-26 NOTE — Progress Notes (Signed)
Patient ID: Olivia Payne, female   DOB: [birth year], 42 y.o.   MRN: [id number] Subjective/Complaints:  Tongue still sore. "when is this thrush going to go away?" A 12 point review of systems has been performed and if not noted above is otherwise negative.   Objective: Vital Signs: Blood pressure 111/69, pulse 68, temperature 97.8 F (36.6 C), temperature source Oral, resp. rate 20, weight 146.7 kg (323 lb 6.6 oz), last menstrual period 07/07/1992, SpO2 98.00%. No results found. No results found for this basename: WBC, HGB, HCT, PLT,  in the last 72 hours No results found for this basename: NA, K, CL, CO, GLUCOSE, BUN, CREATININE, CALCIUM,  in the last 72 hours CBG (last 3)   Recent Labs  07/25/12 1148 07/25/12 1744 07/25/12 2120  GLUCAP 189* 193* 211*    Wt Readings from Last 3 Encounters:  07/21/12 146.7 kg (323 lb 6.6 oz)  07/11/12 172.6 kg (380 lb 8.2 oz)  07/11/12 172.6 kg (380 lb 8.2 oz)    Physical Exam:  Morbidly obese.  HENT: mild thrush on tongue still Head: Normocephalic and atraumatic.  Eyes: EOM are normal. Pupils are equal, round, and reactive to light.  Neck: No tracheal deviation present. No thyromegaly present.  Cardiovascular: Normal rate and regular rhythm.  Pulmonary/Chest: Effort normal and breath sounds normal.  Abdominal: Soft. Bowel sounds are normal. She exhibits no distension. There is no tenderness.  Musculoskeletal: She exhibits edema.  Valgus and ER of right knee, tender to touch with mild effusion.  Lymphadenopathy:  She has no cervical adenopathy.  Neurological: just awakening. A little slow to arouse but answers all my questions May have impaired vision in central fields. Has inattention throughout the left visual however. 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 Impaired sitting balance when distracted but stable at rest. . Strength 4/5 RUE and 4- Olivia. LE strength inhibited by knee pain ,right more than left. DTR's 1+,  sensation grossly intact in all 4.  Skin:  Right scalp incision clean and well approximated with suture. Mild scab, dried blood around incision   Assessment/Plan: 1. Functional deficits secondary to AVM s/p repair and eventual cranioplasty with ongoing pain,gait,cognitive issues which require 3+ hours per day of interdisciplinary therapy in a comprehensive inpatient rehab setting. Physiatrist is providing close team supervision and 24 hour management of active medical problems listed below. Physiatrist and rehab team continue to assess barriers to discharge/monitor patient progress toward functional and medical goals.    FIM: FIM - Bathing Bathing Steps Patient Completed: Chest;Right Arm;Left Arm;Abdomen;Front perineal area;Buttocks;Right upper leg;Left upper leg;Right lower leg (including foot);Left lower leg (including foot) Bathing: 5: Supervision: Safety issues/verbal cues  FIM - Upper Body Dressing/Undressing Upper body dressing/undressing steps patient completed: Thread/unthread right bra strap;Thread/unthread left bra strap;Thread/unthread left sleeve of pullover shirt/dress;Hook/unhook bra;Thread/unthread right sleeve of pullover shirt/dresss;Put head through opening of pull over shirt/dress;Pull shirt over trunk Upper body dressing/undressing: 5: Set-up assist to: Obtain clothing/put away FIM - Lower Body Dressing/Undressing Lower body dressing/undressing steps patient completed: Thread/unthread right underwear leg;Thread/unthread left underwear leg;Pull underwear up/down;Thread/unthread right pants leg;Thread/unthread left pants leg;Pull pants up/down;Don/Doff right sock;Don/Doff left sock Lower body dressing/undressing: 5: Supervision: Safety issues/verbal cues  FIM - Toileting Toileting steps completed by patient: Adjust clothing prior to toileting;Performs perineal hygiene;Adjust clothing after toileting Toileting Assistive Devices: Grab bar or rail for support Toileting: 5:  Supervision: Safety issues/verbal cues  FIM - Diplomatic Services operational officer Devices: Grab bars Toilet Transfers: 5-To toilet/BSC: Supervision (verbal  cues/safety issues);5-From toilet/BSC: Supervision (verbal cues/safety issues)  FIM - Banker Devices: Therapist, occupational: 7: Supine > Sit: No assist;7: Bed > Chair or W/C: No assist;7: Chair or W/C > Bed: No assist  FIM - Locomotion: Wheelchair Distance: 176 Locomotion: Wheelchair: 5: Travels 150 ft or more: maneuvers on rugs and over door sills with supervision, cueing or coaxing FIM - Locomotion: Ambulation Locomotion: Ambulation Assistive Devices: Designer, industrial/product Ambulation/Gait Assistance: 3: Mod assist Locomotion: Ambulation: 1: Travels less than 50 ft with moderate assistance (Pt: 50 - 74%)  Comprehension Comprehension Mode: Auditory Comprehension: 5-Understands complex 90% of the time/Cues < 10% of the time  Expression Expression Mode: Verbal Expression: 6-Expresses complex ideas: With extra time/assistive device  Social Interaction Social Interaction: 4-Interacts appropriately 75 - 89% of the time - Needs redirection for appropriate language or to initiate interaction.  Problem Solving Problem Solving: 4-Solves basic 75 - 89% of the time/requires cueing 10 - 24% of the time  Memory Memory: 5-Recognizes or recalls 90% of the time/requires cueing < 10% of the time  Medical Problem List and Plan:  1. DVT Prophylaxis/Anticoagulation: Pharmaceutical: Lovenox.  2. Facial pain/chronic back pain/chronic right knee pain/ headaches: Will continue topamax and lyrica for facial pain/headache. . AddedVoltaren gel to help with knee pain. Ok to use icy hot patches to back daily.   - toradol available on a prn basis  -overall pain/headaches are much improved 3. Anxiety/Depression: continue cymbalta. Will need a lot of ego support by team. Seems anxious and distracted by  multiple somatic complaints and poor insight/awareness.  4. Neuropsych: This patient is capable of making decisions on her own behalf.  5. DM type 2: Will monitor with AC/HS cbg checks. Continue metformin but increase to 100mg  xr  -improving control--no changes today. 6. HTN: Will monitor with bid checks.  7. Morbid obesity:   educate patient regarding weight loss/ diet. Routine pressure relief measures 8.  R knee pain, showing improvement 9. Thrush-give diflucan x 1 dose in addition to mouth wash LOS (Days) 10 A FACE TO FACE EVALUATION WAS PERFORMED  SWARTZ,ZACHARY T 07/26/2012 7:01 AM

## 2012-07-27 ENCOUNTER — Inpatient Hospital Stay (HOSPITAL_COMMUNITY): Payer: Managed Care, Other (non HMO)

## 2012-07-27 ENCOUNTER — Inpatient Hospital Stay (HOSPITAL_COMMUNITY): Payer: Managed Care, Other (non HMO) | Admitting: *Deleted

## 2012-07-27 ENCOUNTER — Inpatient Hospital Stay (HOSPITAL_COMMUNITY): Payer: Managed Care, Other (non HMO) | Admitting: Speech Pathology

## 2012-07-27 DIAGNOSIS — I10 Essential (primary) hypertension: Secondary | ICD-10-CM | POA: Diagnosis present

## 2012-07-27 DIAGNOSIS — F319 Bipolar disorder, unspecified: Secondary | ICD-10-CM | POA: Diagnosis present

## 2012-07-27 DIAGNOSIS — F329 Major depressive disorder, single episode, unspecified: Secondary | ICD-10-CM | POA: Diagnosis present

## 2012-07-27 DIAGNOSIS — B370 Candidal stomatitis: Secondary | ICD-10-CM | POA: Diagnosis not present

## 2012-07-27 LAB — GLUCOSE, CAPILLARY: Glucose-Capillary: 150 mg/dL — ABNORMAL HIGH (ref 70–99)

## 2012-07-27 MED ORDER — FLUCONAZOLE 100 MG PO TABS
100.0000 mg | ORAL_TABLET | Freq: Every day | ORAL | Status: AC
  Administered 2012-07-27 – 2012-07-28 (×2): 100 mg via ORAL
  Filled 2012-07-27 (×2): qty 1

## 2012-07-27 NOTE — Progress Notes (Signed)
Patient ID: Olivia Payne, female   DOB: Jul 06, 1970, 42 y.o.   MRN: [id number] Subjective/Complaints:  Tongue/throat still sore. Otherwise doing well. Wants to be able to walk better.  A 12 point review of systems has been performed and if not noted above is otherwise negative.   Objective: Vital Signs: Blood pressure 129/56, pulse 61, temperature 98 F (36.7 C), temperature source Oral, resp. rate 18, weight 146.7 kg (323 lb 6.6 oz), last menstrual period 07/07/1992, SpO2 98.00%. No results found. No results found for this basename: WBC, HGB, HCT, PLT,  in the last 72 hours No results found for this basename: NA, K, CL, CO, GLUCOSE, BUN, CREATININE, CALCIUM,  in the last 72 hours CBG (last 3)   Recent Labs  07/26/12 1618 07/26/12 2126 07/27/12 0726  GLUCAP 177* 159* 162*    Wt Readings from Last 3 Encounters:  07/21/12 146.7 kg (323 lb 6.6 oz)  07/11/12 172.6 kg (380 lb 8.2 oz)  07/11/12 172.6 kg (380 lb 8.2 oz)    Physical Exam:  Morbidly obese.  HENT: mild thrush on tongue still Head: Normocephalic and atraumatic.  Eyes: EOM are normal. Pupils are equal, round, and reactive to light.  Neck: No tracheal deviation present. No thyromegaly present.  Cardiovascular: Normal rate and regular rhythm.  Pulmonary/Chest: Effort normal and breath sounds normal.  Abdominal: Soft. Bowel sounds are normal. She exhibits no distension. There is no tenderness.  Musculoskeletal: She exhibits edema.  Valgus and ER of right knee, tender to touch with mild effusion.  Lymphadenopathy:  She has no cervical adenopathy.  Neurological: just awakening. A little slow to arouse but answers all my questions May have impaired vision in central fields. Has inattention throughout the left visual however. 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 Impaired sitting balance when distracted but stable at rest. . Strength 4/5 RUE and 4- LUE. LE strength inhibited by knee pain ,right more  than left. DTR's 1+, sensation grossly intact in all 4.  Skin:  Right scalp incision clean and well approximated with suture. Mild scab, dried blood around incision   Assessment/Plan: 1. Functional deficits secondary to AVM s/p repair and eventual cranioplasty with ongoing pain,gait,cognitive issues which require 3+ hours per day of interdisciplinary therapy in a comprehensive inpatient rehab setting. Physiatrist is providing close team supervision and 24 hour management of active medical problems listed below. Physiatrist and rehab team continue to assess barriers to discharge/monitor patient progress toward functional and medical goals.    FIM: FIM - Bathing Bathing Steps Patient Completed: Chest;Right Arm;Left Arm;Abdomen;Front perineal area;Buttocks;Right upper leg;Left upper leg;Right lower leg (including foot);Left lower leg (including foot) Bathing: 5: Supervision: Safety issues/verbal cues  FIM - Upper Body Dressing/Undressing Upper body dressing/undressing steps patient completed: Thread/unthread right bra strap;Thread/unthread left bra strap;Thread/unthread left sleeve of pullover shirt/dress;Hook/unhook bra;Thread/unthread right sleeve of pullover shirt/dresss;Put head through opening of pull over shirt/dress;Pull shirt over trunk Upper body dressing/undressing: 5: Set-up assist to: Obtain clothing/put away FIM - Lower Body Dressing/Undressing Lower body dressing/undressing steps patient completed: Thread/unthread right underwear leg;Thread/unthread left underwear leg;Pull underwear up/down;Thread/unthread right pants leg;Thread/unthread left pants leg;Pull pants up/down;Don/Doff right sock;Don/Doff left sock Lower body dressing/undressing: 5: Supervision: Safety issues/verbal cues  FIM - Toileting Toileting steps completed by patient: Adjust clothing prior to toileting;Performs perineal hygiene;Adjust clothing after toileting Toileting Assistive Devices: Grab bar or rail for  support Toileting: 5: Supervision: Safety issues/verbal cues  FIM - Diplomatic Services operational officer Devices: Grab bars Toilet Transfers: 5-To  toilet/BSC: Supervision (verbal cues/safety issues);5-From toilet/BSC: Supervision (verbal cues/safety issues)  FIM - Banker Devices: Therapist, occupational: 7: Supine > Sit: No assist;7: Bed > Chair or W/C: No assist;7: Chair or W/C > Bed: No assist  FIM - Locomotion: Wheelchair Distance: 176 Locomotion: Wheelchair: 5: Travels 150 ft or more: maneuvers on rugs and over door sills with supervision, cueing or coaxing FIM - Locomotion: Ambulation Locomotion: Ambulation Assistive Devices: Designer, industrial/product Ambulation/Gait Assistance: 3: Mod assist Locomotion: Ambulation: 1: Travels less than 50 ft with moderate assistance (Pt: 50 - 74%)  Comprehension Comprehension Mode: Auditory Comprehension: 5-Understands complex 90% of the time/Cues < 10% of the time  Expression Expression Mode: Verbal Expression: 5-Expresses complex 90% of the time/cues < 10% of the time  Social Interaction Social Interaction: 6-Interacts appropriately with others with medication or extra time (anti-anxiety, antidepressant).  Problem Solving Problem Solving: 4-Solves basic 75 - 89% of the time/requires cueing 10 - 24% of the time  Memory Memory: 5-Recognizes or recalls 90% of the time/requires cueing < 10% of the time  Medical Problem List and Plan:  1. DVT Prophylaxis/Anticoagulation: Pharmaceutical: Lovenox.  2. Facial pain/chronic back pain/chronic right knee pain/ headaches: Will continue topamax and lyrica for facial pain/headache. . AddedVoltaren gel to help with knee pain. Ok to use icy hot patches to back daily.   - toradol available on a prn basis  -overall pain/headaches are much improved 3. Anxiety/Depression: continue cymbalta. Will need a lot of ego support by team. Seems anxious and distracted by  multiple somatic complaints and poor insight/awareness.  4. Neuropsych: This patient is capable of making decisions on her own behalf.  5. DM type 2: Will monitor with AC/HS cbg checks. Increased metformin to 1000mg  xr  -improving control- 6. HTN: Will monitor with bid checks.  7. Morbid obesity:   educate patient regarding weight loss/ diet. Routine pressure relief measures 8.  R knee pain, showing improvement 9. Thrush-give diflucan x 1 dose in addition to mouth wash--may give her two more days of the 100mg  dose as she still has thrush and is uncomfortable. LOS (Days) 11 A FACE TO FACE EVALUATION WAS PERFORMED  Kennette Cuthrell T 07/27/2012 8:50 AM

## 2012-07-27 NOTE — Progress Notes (Signed)
Recreational Therapy Session Note  Patient Details  Name: Olivia Payne MRN: [id number] Date of Birth: Feb 16, 1971 Today's Date: 07/27/2012 Time:  1510-1600 Pain: no c/o Skilled Therapeutic Interventions/Progress Updates: pt c/o fatigue upon entry to room.  Pt with multiple questions about discharge planning including recommendations of ramp, potential brace for knee, potential SNF placement, etc. Emotional support provided.  Therapy/Group: Individual Therapy   Marquee Fuchs 07/27/2012, 4:56 PM

## 2012-07-27 NOTE — Progress Notes (Signed)
Speech Language Pathology Daily Session Note  Patient Details  Name: Olivia Payne MRN: [id number] Date of Birth: [birth year]  Today's Date: 07/27/2012 Time: 7829-5621 Time Calculation (min): 45 min  Short Term Goals: Week 2: SLP Short Term Goal 1 (Week 2): Pt will demonstrate selective attention for at least 10 minutes during a functional task with Min clinician cues for redirection SLP Short Term Goal 2 (Week 2): Pt will utilize external memory aids to recall new, daily information with Min cues from SLP. SLP Short Term Goal 3 (Week 2): Pt will utilize working memory strategies during functional tasks with Engineer, maintenance cues. SLP Short Term Goal 4 (Week 2): Pt will demonstrate complex problem solving with Supervision verbal cues. SLP Short Term Goal 5 (Week 2): Pt will utilize word finding strategies in conversation with Modified Independence.  Skilled Therapeutic Interventions: Skilled treatment session focused on addressing cognitive-linguistic goals.  SLP facilitated session with home management activities such as using external assist to assist with daily recall and money management.  SLP facilitated session with Min cues to orient self with use of calendar; Min assist verbal and written cues to organize and sequence during moderately complex problem solving.  Patient was attempting to "do it in her head," and required cues to write info down to double check herself due to limited awareness of errors, which increased accuracy by end of session.      FIM:  Comprehension Comprehension Mode: Auditory Comprehension: 5-Understands complex 90% of the time/Cues < 10% of the time Expression Expression Mode: Verbal Expression: 5-Expresses complex 90% of the time/cues < 10% of the time Social Interaction Social Interaction: 6-Interacts appropriately with others with medication or extra time (anti-anxiety, antidepressant). Problem Solving Problem Solving: 4-Solves basic 75 - 89% of the  time/requires cueing 10 - 24% of the time Memory Memory: 5-Recognizes or recalls 90% of the time/requires cueing < 10% of the time FIM - Eating Eating Activity: 7: Complete independence:no helper  Pain Pain Assessment Pain Assessment: No/denies pain  Therapy/Group: Individual Therapy  [name]., CCC-SLP [phone]  Olivia Payne 07/27/2012, 1:31 PM

## 2012-07-27 NOTE — Progress Notes (Signed)
Occupational Therapy Session Note  Patient Details  Name: Olivia Payne MRN: [id number] Date of Birth: 05-04-1970  Today's Date: 07/27/2012 Time: 4098-1191 and 1345-1430 Time Calculation (min): 60 min and 45 min   Short Term Goals: Week 2:  OT Short Term Goal 1 (Week 2): Pt will transfer to toilet from w/c with mod I. OT Short Term Goal 2 (Week 2): Pt will toilet with mod I. OT Short Term Goal 3 (Week 2): Pt will transfer to shower stall with 2 inch ledge with supervision.  Skilled Therapeutic Interventions/Progress Updates:    Session 1: Therapy session on ADL retraining focused on awareness, sequencing, attention to task, safety, and activity tolerance. Pt finishing breakfast upon arrival and immediately distracted when OT arrivaed. Pt completed SPT bed>w/c with close supervision. Completed toileting task/transfer and shower transfer with close supervision. Required cues for safety as pt attempted to doff nonslip socks prior to transfer however recall of safety technique when transferring from shower. Required cues for sequencing during shower task as pt unaware if she washed all body parts on 2 occasions. Completed dressing with close supervision and cues to stay on task as pt begins talking and "forgets what I'm doing." Able to don socks by propping feet on bed. Engaged in oral care at sink in standing (approx 3 min) before becoming fatigued. Once in standing pt required cue to complete oral care as she could not recall what she wanted to do. Pt reports having this problem at home and OT educated on making lists to help compensate for memory and sequencing deficits. Pt interested in making list.   Session 2: Therapy session focused on memory recall for self-care/home management tasks utilizing teach back method and activity tolerance. Pt's daughter present throughout session. Pt able to recall she was going to type a list with OT this PM however required cue on what it was for. Pt very excited  to make lists stating "this is really going to help." Had pt type (with verbal cues to use last 3 digits on each hand) morning, night, and bathing lists as pt very concerned that she is not able to recall all items required for these tasks or what she does. Using teach back pt able to recall approx 50-65% of items and things she needed to do each morning and evening. Pt recalled good locations that were discussed in AM session as to where to place lists so pt would see them each day. Pt reports being overwhelmed with all her reminder notes at home so discussed making one large list for morning and night. Pt reported being "excited to try it out." Daughter present and engaged throughout session and assisted with helping mother come up with location ideas for lists. Pt became very overwhelmed towards end of making list as she thought she had placed item in wrong spot and requires cues to take break as her memory became further impaired by the stress. Pt able to correct after rest break. Pt completed 10 min of task in standing to increase activity tolerance.   Therapy Documentation Precautions:  Precautions Precautions: Fall Precaution Comments: LEs buckle, L hemianopsia and central visual deficits.  Restrictions Weight Bearing Restrictions: No General:   Vital Signs:   Pain: Np c/o pain during therapy sessions  Other Treatments:    See FIM for current functional status  Therapy/Group: Individual Therapy  Revonda Menter, Vara Guardian 07/27/2012, 10:10 AM

## 2012-07-27 NOTE — Progress Notes (Signed)
Physical Therapy Session Note  Patient Details  Name: Olivia Payne MRN: [id number] Date of Birth: 11-25-1970  Today's Date: 07/27/2012 Time: 1115-1200 Time Calculation (min): 45 min  Short Term Goals: Week 2:  PT Short Term Goal 1 (Week 2): Pt will perform furniture transfers to sofa and armchairs of various heights with supervision. PT Short Term Goal 2 (Week 2): Pt will perform gait x 60' with min assist. PT Short Term Goal 3 (Week 2): Pt will ascend/descend 5 stairs with 2 rails with mod assist. PT Short Term Goal 4 (Week 2): pt will propel w/c in home environment x 50' with supervision,  1-2 VCs for safety and L attention. PT Short Term Goal 5 (Week 2): Pt will perform 2- item exercise  program accurately with hand-out, and 1-2 cues.  Skilled Therapeutic Interventions/Progress Updates:    Patient received sitting in wheelchair from SLP. Session focused on gait training, sit<>stand and stand pivot transfers, and safety with functional mobility. Ace wrap on L knee donned at beginning of session to improve L knee stability. See details below for gait training. Patient performed x15 sit<>stand transfers from edge of mat without use of UEs. Emphasis on proper foot placement (as B feet tend to be positioned externally rotated) and anterior weight shifts. Patient requires increased time to complete and frequent verbal cues to attend to task, as she becomes distracted. Patient performed stand pivot transfers with emphasis on proper foot placement when stepping. Patient returned to room and left seated in wheelchair with all needs within reach.  Therapy Documentation Precautions:  Precautions Precautions: Fall Precaution Comments: LEs buckle, L hemianopsia and central visual deficits.  Restrictions Weight Bearing Restrictions: No Pain: Pain Assessment Pain Assessment: 0-10 Locomotion : Ambulation Ambulation: Yes Ambulation/Gait Assistance: 4: Min assist Ambulation Distance (Feet): 11  Feet Assistive device: Rolling walker Ambulation/Gait Assistance Details: Manual facilitation for weight shifting;Manual facilitation for placement;Verbal cues for precautions/safety;Verbal cues for gait pattern;Verbal cues for sequencing;Verbal cues for technique;Tactile cues for posture Ambulation/Gait Assistance Details: Patient instructed in gait training 11'x1, requires manual facilitation intermittently for advancement of L LE; requires verbal cues for increasing step length Gait Gait: Yes Gait Pattern: Step-to pattern;Decreased stride length;Left flexed knee in stance;Shuffle   See FIM for current functional status  Therapy/Group: Individual Therapy  Chipper Herb. Charese Abundis, PT, DPT  07/27/2012, 12:26 PM

## 2012-07-28 ENCOUNTER — Inpatient Hospital Stay (HOSPITAL_COMMUNITY): Payer: Managed Care, Other (non HMO)

## 2012-07-28 ENCOUNTER — Inpatient Hospital Stay (HOSPITAL_COMMUNITY): Payer: Managed Care, Other (non HMO) | Admitting: Speech Pathology

## 2012-07-28 ENCOUNTER — Inpatient Hospital Stay (HOSPITAL_COMMUNITY): Payer: Managed Care, Other (non HMO) | Admitting: *Deleted

## 2012-07-28 LAB — GLUCOSE, CAPILLARY: Glucose-Capillary: 92 mg/dL (ref 70–99)

## 2012-07-28 MED ORDER — DEXAMETHASONE 2 MG PO TABS
2.0000 mg | ORAL_TABLET | Freq: Every day | ORAL | Status: DC
  Filled 2012-07-28 (×2): qty 1

## 2012-07-28 MED ORDER — DEXAMETHASONE 2 MG PO TABS
2.0000 mg | ORAL_TABLET | Freq: Every day | ORAL | Status: AC
  Administered 2012-07-28 – 2012-07-31 (×4): 2 mg via ORAL
  Filled 2012-07-28 (×4): qty 1

## 2012-07-28 MED ORDER — NYSTATIN 100000 UNIT/ML MT SUSP
10.0000 mL | Freq: Four times a day (QID) | OROMUCOSAL | Status: DC
  Administered 2012-07-28 – 2012-08-02 (×21): 1000000 [IU] via OROMUCOSAL
  Filled 2012-07-28 (×29): qty 10

## 2012-07-28 NOTE — Plan of Care (Signed)
Problem: RH KNOWLEDGE DEFICIT Goal: RH STG INCREASE KNOWLEDGE OF HYPERTENSION Patient/caregiver will be able to verbalize strategies to mange high blood pressure at home. (ie.. Diet, medications, exercise under MD supervision,complaince with medication regime,regular follow up with PCP)  Outcome: Progressing Patient's Prinivil discontinued secondary to low BP

## 2012-07-28 NOTE — Progress Notes (Signed)
Social Work Patient ID: Olivia Payne, female   DOB: Jun 17, 1970, 42 y.o.   MRN: [id number] Met with pt and left message for husband regarding team conference progression toward goals-supervision/min level and discharge 6/3. Discussed OP therapies and pt aware can not go to [place] due to only has PT.  [facility] is close to them and will see time's available. Pt had many questions regarding walking and how long it would take and how much therapy she would get.  Work toward discharge and have husband come in for [facility] [place] education.

## 2012-07-28 NOTE — Progress Notes (Signed)
New order for Decadron 2 mg to be given to patient every evening, instead of every 12 hours due to increase symptoms of sore throat and white coat present on tongue. Order given by Delle Reining, PA. Pt aware of new order no questions at this time.

## 2012-07-28 NOTE — Progress Notes (Signed)
Recreational Therapy Session Note  Patient Details  Name: Olivia Payne MRN: [id number] Date of Birth: 07-Oct-1970 Today's Date: 07/28/2012 Time:  4098-1191 Pain: no c/o Skilled Therapeutic Interventions/Progress Updates: Session focused on standing tolerance, dynamic balance, & safety.  Pt stood for horse shoe reaching activity with focus on L knee control.  Pt sat EOM kicking a ball using LLE initially with assistance progressing to supervision.  Pt stood with 1 UE support on RW for ball kicking activity using LLE with min assist.  Pt required min- mod cues throughout session.  Therapy/Group: Co-Treatment Torri Langston 07/28/2012, 12:08 PM

## 2012-07-28 NOTE — Progress Notes (Signed)
Patient ID: Olivia Payne, female   DOB: 17-Aug-1970, 42 y.o.   MRN: [id number] Subjective/Complaints:  Tongue/throat still sore. Otherwise doing well. Wants to be able to walk better.  A 12 point review of systems has been performed and if not noted above is otherwise negative.   Objective: Vital Signs: Blood pressure 103/70, pulse 90, temperature 97.8 F (36.6 C), temperature source Oral, resp. rate 18, weight 146.7 kg (323 lb 6.6 oz), last menstrual period 07/07/1992, SpO2 99.00%. No results found. No results found for this basename: WBC, HGB, HCT, PLT,  in the last 72 hours No results found for this basename: NA, K, CL, CO, GLUCOSE, BUN, CREATININE, CALCIUM,  in the last 72 hours CBG (last 3)   Recent Labs  07/27/12 1608 07/27/12 2052 07/28/12 0740  GLUCAP 132* 150* 112*    Wt Readings from Last 3 Encounters:  07/21/12 146.7 kg (323 lb 6.6 oz)  07/11/12 172.6 kg (380 lb 8.2 oz)  07/11/12 172.6 kg (380 lb 8.2 oz)    Physical Exam:  Morbidly obese.  HENT: mild thrush on tongue still Head: Normocephalic and atraumatic.  Eyes: EOM are normal. Pupils are equal, round, and reactive to light.  Neck: No tracheal deviation present. No thyromegaly present.  Cardiovascular: Normal rate and regular rhythm.  Pulmonary/Chest: Effort normal and breath sounds normal.  Abdominal: Soft. Bowel sounds are normal. She exhibits no distension. There is no tenderness.  Musculoskeletal: She exhibits edema.  Valgus and ER of right knee, tender to touch with mild effusion.  Lymphadenopathy:  She has no cervical adenopathy.  Neurological: just awakening. A little slow to arouse but answers all my questions May have impaired vision in central fields. Has inattention throughout the left visual however. 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 Impaired sitting balance when distracted but stable at rest. . Strength 4/5 RUE and 4- LUE. LE strength inhibited by knee pain ,right more  than left. DTR's 1+, sensation grossly intact in all 4.  Skin:  Right scalp incision clean and well approximated with suture. Mild scab, dried blood around incision   Assessment/Plan: 1. Functional deficits secondary to AVM s/p repair and eventual cranioplasty with ongoing pain,gait,cognitive issues which require 3+ hours per day of interdisciplinary therapy in a comprehensive inpatient rehab setting. Physiatrist is providing close team supervision and 24 hour management of active medical problems listed below. Physiatrist and rehab team continue to assess barriers to discharge/monitor patient progress toward functional and medical goals.    FIM: FIM - Bathing Bathing Steps Patient Completed: Chest;Right Arm;Left Arm;Abdomen;Front perineal area;Buttocks;Right upper leg;Left upper leg;Right lower leg (including foot);Left lower leg (including foot) Bathing: 5: Supervision: Safety issues/verbal cues  FIM - Upper Body Dressing/Undressing Upper body dressing/undressing steps patient completed: Thread/unthread right bra strap;Thread/unthread left bra strap;Thread/unthread left sleeve of pullover shirt/dress;Hook/unhook bra;Thread/unthread right sleeve of pullover shirt/dresss;Put head through opening of pull over shirt/dress;Pull shirt over trunk Upper body dressing/undressing: 5: Set-up assist to: Obtain clothing/put away FIM - Lower Body Dressing/Undressing Lower body dressing/undressing steps patient completed: Thread/unthread right underwear leg;Thread/unthread left underwear leg;Pull underwear up/down;Thread/unthread right pants leg;Thread/unthread left pants leg;Pull pants up/down;Don/Doff right sock;Don/Doff left sock Lower body dressing/undressing: 5: Supervision: Safety issues/verbal cues  FIM - Toileting Toileting steps completed by patient: Adjust clothing prior to toileting;Performs perineal hygiene;Adjust clothing after toileting Toileting Assistive Devices: Grab bar or rail for  support Toileting: 5: Supervision: Safety issues/verbal cues  FIM - Diplomatic Services operational officer Devices: Grab bars Toilet Transfers: 5-To  toilet/BSC: Supervision (verbal cues/safety issues);5-From toilet/BSC: Supervision (verbal cues/safety issues)  FIM - Banker Devices: Therapist, occupational: 4: Chair or W/C > Bed: Min A (steadying Pt. > 75%);4: Bed > Chair or W/C: Min A (steadying Pt. > 75%)  FIM - Locomotion: Wheelchair Distance: 176 Locomotion: Wheelchair: 1: Total Assistance/staff pushes wheelchair (Pt<25%) FIM - Locomotion: Ambulation Locomotion: Ambulation Assistive Devices: Designer, industrial/product Ambulation/Gait Assistance: 4: Min assist Locomotion: Ambulation: 1: Travels less than 50 ft with minimal assistance (Pt.>75%)  Comprehension Comprehension Mode: Auditory Comprehension: 5-Understands complex 90% of the time/Cues < 10% of the time  Expression Expression Mode: Verbal Expression: 5-Expresses complex 90% of the time/cues < 10% of the time  Social Interaction Social Interaction: 6-Interacts appropriately with others with medication or extra time (anti-anxiety, antidepressant).  Problem Solving Problem Solving: 4-Solves basic 75 - 89% of the time/requires cueing 10 - 24% of the time  Memory Memory: 5-Recognizes or recalls 90% of the time/requires cueing < 10% of the time  Medical Problem List and Plan:  1. DVT Prophylaxis/Anticoagulation: Pharmaceutical: Lovenox.  2. Facial pain/chronic back pain/chronic right knee pain/ headaches: Will continue topamax and lyrica for facial pain/headache. . AddedVoltaren gel to help with knee pain. Ok to use icy hot patches to back daily.   - toradol prn  -overall pain/headaches are much improved 3. Anxiety/Depression: continue cymbalta. Will need a lot of ego support by team. Seems anxious and distracted by multiple somatic complaints and poor insight/awareness.  4.  Neuropsych: This patient is capable of making decisions on her own behalf.  5. DM type 2: Will monitor with AC/HS cbg checks. Increased metformin to 1000mg  xr  -improving control-  -watch with steroid taper to off over next 4days 6. HTN: Will monitor with bid checks.  7. Morbid obesity:   educate patient regarding weight loss/ diet. Routine pressure relief measures 8.  R knee pain, showing improvement 9. Thrush-give diflucan for 3 days total  -nystatin  LOS (Days) 12 A FACE TO FACE EVALUATION WAS PERFORMED  SWARTZ,ZACHARY T 07/28/2012 8:08 AM

## 2012-07-28 NOTE — Patient Care Conference (Signed)
Inpatient RehabilitationTeam Conference and Plan of Care Update Date: 5/282014   Time: 11:15 Am    Patient Name: Olivia Payne      Medical Record Number: [id number]  Date of Birth: Nov 27, 1970 Sex: Female         Room/Bed: 4153/4153-01 Payor Info: Payor: Monia Pouch / Plan: Derrek Gu / Product Type: *No Product type* /    Admitting Diagnosis: CRANIOPLASTY  CVA  Admit Date/Time:  07/16/2012  5:56 PM Admission Comments: No comment available   Primary Diagnosis:  AVM (arteriovenous malformation) brain Principal Problem: AVM (arteriovenous malformation) brain  Patient Active Problem List   Diagnosis Date Noted  . Thrush, oral 07/27/2012  . HTN (hypertension) 07/27/2012  . Depressive disorder, not elsewhere classified 07/27/2012  . Morbid obesity 07/19/2012  . AVM (arteriovenous malformation) brain 05/26/2012  . Anxiety state, unspecified 08/01/2011  . Type II or unspecified type diabetes mellitus with ophthalmic manifestations, not stated as uncontrolled(250.50) 08/01/2011  . Hypothyroidism 08/01/2011  . Vitamin D deficiency 08/01/2011  . Weight gain 08/01/2011    Expected Discharge Date: Expected Discharge Date: 08/03/12  Team Members Present: Physician leading conference: Dr. Faith Rogue Social Worker Present: Dossie Der, LCSW Nurse Present: Gregor Hams, RN PT Present: Edman Circle, PT;Caroline Adriana Simas, PT;Other (comment) Clarisse Gouge Ripa-PT) OT Present: Leonette Monarch, Felipa Eth, OT SLP Present: Fae Pippin, SLP     Current Status/Progress Goal Weekly Team Focus  Medical   pain improved except for right knee. ?brace  stabilizing right leg for gait  pain mgt, working on focus, stabilizing medically for dc   Bowel/Bladder   continent of bowel and bladder  continent of bowel and Bladder with toileting  Continent   Swallow/Nutrition/ Hydration     na        ADL's   overall supervision however required mod assist for tub transfer into tub   Overall Supervision  and Mod I for toilet transfers  activity tolerance, pain management, standing tolerance and standing balance, patient/caregiver education   Mobility   min A for transfers and gait x10' with RW  S-mod I overall, min A stairs  safety, balance, gait, transfers, B LE NMR, wheelchair mobility, stairs, activity tolerance   Communication   Supervision   Mod I   increase spontaneous use of word findig strategies   Safety/Cognition/ Behavioral Observations  Min-Mod assist  Mod I   increase use of organizational strategies, self-monitoring and correcting   Pain   Scheduled ultram 100 mg po TID, oxtcodone 5 -10 mg po every 4 hours prn for headache  3 or less on scale of 1-10      Skin   Healed incisioon to right side of head, Right abdominal incisin intact with steri strips, Abdominal folds red, antifungal powder to areas BID, Bruises to bilateral arms  no new skin breakdown         *See Care Plan and progress notes for long and short-term goals.  Barriers to Discharge: steps into home, knee brace?    Possible Resolutions to Barriers:  ramp, see below    Discharge Planning/Teaching Needs:  Need to have husband come in to begin family education-pt definitely plans to return home      Team Discussion:  Ortho eval-brace for knee.  Headaches better.  Ambulated 11 ft.  Need to begin family education with husband.  Making slow gains.  Working on ramp at home  Revisions to Treatment Plan:  NOne   Continued Need for Acute Rehabilitation Level of Care: The  patient requires daily medical management by a physician with specialized training in physical medicine and rehabilitation for the following conditions: Daily direction of a multidisciplinary physical rehabilitation program to ensure safe treatment while eliciting the highest outcome that is of practical value to the patient.: Yes Daily medical management of patient stability for increased activity during participation in an intensive rehabilitation  regime.: Yes Daily analysis of laboratory values and/or radiology reports with any subsequent need for medication adjustment of medical intervention for : Neurological problems;Other;Post surgical problems  Lucy Chris 07/29/2012, 12:21 PM

## 2012-07-28 NOTE — Progress Notes (Signed)
Physical Therapy Session Note  Patient Details  Name: Olivia Payne MRN: [id number] Date of Birth: [birth year]  Today's Date: 07/28/2012 Time: 1030-1130 and 1300-1330 Time Calculation (min): 60 min and 30 min  Short Term Goals: Week 2:  PT Short Term Goal 1 (Week 2): Pt will perform furniture transfers to sofa and armchairs of various heights with supervision. PT Short Term Goal 2 (Week 2): Pt will perform gait x 60' with min assist. PT Short Term Goal 3 (Week 2): Pt will ascend/descend 5 stairs with 2 rails with mod assist. PT Short Term Goal 4 (Week 2): pt will propel w/c in home environment x 50' with supervision,  1-2 VCs for safety and L attention. PT Short Term Goal 5 (Week 2): Pt will perform 2- item exercise  program accurately with hand-out, and 1-2 cues.     Skilled Therapeutic Interventions/Progress Updates:  Session 1:  Treatment focused on neuromuscular re-education via demo, VCs, tactile and manual cues for LLE stance stability and swing components, balance retraining, isolated movements LLE. Part of tx co-treatment with Rec. Therapist for skilled assistance +2 for dynamic balance.  Gait x 25' x 2 with RW with mod assist, VCs for L foot position, sequencing with RW.  Pt begins ambulating with ease, demonstrating L ankle DF sufficient to clear foot, but after approx. 10', she begins to catch L toes with each step, requiring lifting assistance.  Pt wears 2 wide ACE wraps on L knee to decrease L knee stance instability, improve confidence. Pt appears limited in performance of gait by internal distractions.  Dynamic standing balance using RW with R or L hand, retrieving items at knee level, replacing overhead, without LOB.  Kicking beach ball in standing with mod assist for balance, R hand on Rw, and in sitting, working on rapid, automatic L knee flexion/extension with DF.  Therapeutic ex in supine: lower trunk rotation L ankle DF with extended knee and bil bridging for isolated  movement LLE.  Standing endurance x 3 minutes during bil UE task., R foot on 4" high stool to force wt onto LLE, min assist.  Sit>< stand without use of hands, to high mat with supervision.    Session 2:  Treatment focused on neuromuscular re-education as above for gait, stairs, L LE stance and swing control.  Gait without ACE on L knee, x 52' with min assist, mod cues for L toe clearance, wider BOS, upright stance and forward gaze.  Gait up/down 3 (5" ) steps with 2 rails, min assist, step- to method up and down.  Using moveable staircase with 2 hand rails, pt performed step/taps for L foot clearance and L step ups for L knee stance control.  Pt c/o L mild patellar pain after step- ups.  Discussed LLE weakness and resulting L patellar pain due to L hemi. L hamstring curls for isolated L knee flexion and eccentric control during terminal stance.    Therapy Documentation Precautions:  Precautions Precautions: Fall Precaution Comments: LEs buckle, L hemianopsia and central visual deficits.  Restrictions Weight Bearing Restrictions: No   Pain: Pain Assessment Pain Assessment: No/denies pain   Locomotion : Ambulation Ambulation/Gait Assistance: 3: Mod assist AM, min assist PM Wheelchair Mobility Distance: 176    Other Treatments: Treatments Neuromuscular Facilitation: Left;Activity to increase motor control;Activity to increase timing and sequencing;Activity to increase grading;Activity to increase anterior-posterior weight shifting;Activity to increase sustained activation  See FIM for current functional status  Therapy/Group: Individual Therapy  Michaelene Dutan 07/28/2012, 11:51 AM

## 2012-07-28 NOTE — Progress Notes (Signed)
Occupational Therapy Session Note  Patient Details  Name: Olivia Payne MRN: [id number] Date of Birth: 09/11/1970  Today's Date: 07/28/2012 Time: 4098-1191 Time Calculation (min): 60 min  Short Term Goals: Week 2:  OT Short Term Goal 1 (Week 2): Pt will transfer to toilet from w/c with mod I. OT Short Term Goal 2 (Week 2): Pt will toilet with mod I. OT Short Term Goal 3 (Week 2): Pt will transfer to shower stall with 2 inch ledge with supervision.  Skilled Therapeutic Interventions/Progress Updates:    Therapy session on ADL retraining focused on carryover of list tech for memory and cognitive deficits, dynamic standing balance, activity tolerance, and functio did help however pt continued to be anxious through self-care tasks as she is fearful of forgetting to complete all aspenal transfers. Pt located list on computer and required increased time to read list and apply to retrieve items. Pt reported listcts of bathing task. Completes SPT w/c<>toilet and w/c<>TTB with increased anxiety. Pt required verbal cues to stop and take break when becoming anxious throughout session. Pt completed oral care in standing for approx 2 min before requiring rest break secondary to fatigue. Pt reported getting ramp installed at beginning of therapy session and continued to perseverate on this throughout entire session.   Therapy Documentation Precautions:  Precautions Precautions: Fall Precaution Comments: LEs buckle, L hemianopsia and central visual deficits.  Restrictions Weight Bearing Restrictions: No General:   Vital Signs:   Pain: Pain Assessment Pain Assessment: 0-10 Pain Score:   4 Pain Type: Surgical pain Pain Location: Head Pain Orientation: Right Pain Descriptors / Indicators: Headache Pain Frequency: Constant Pain Intervention(s): Medication (See eMAR) ADL:   Exercises:   Other Treatments:    See FIM for current functional status  Therapy/Group: Individual  Therapy  Daneil Dan 07/28/2012, 10:17 AM

## 2012-07-28 NOTE — Progress Notes (Signed)
Speech Language Pathology Daily Session Note  Patient Details  Name: Olivia Payne MRN: [id number] Date of Birth: 1970-04-01  Today's Date: 07/28/2012 Time: 4098-1191 Time Calculation (min): 45 min  Short Term Goals: Week 2: SLP Short Term Goal 1 (Week 2): Pt will demonstrate selective attention for at least 10 minutes during a functional task with Min clinician cues for redirection SLP Short Term Goal 2 (Week 2): Pt will utilize external memory aids to recall new, daily information with Min cues from SLP. SLP Short Term Goal 3 (Week 2): Pt will utilize working memory strategies during functional tasks with Engineer, maintenance cues. SLP Short Term Goal 4 (Week 2): Pt will demonstrate complex problem solving with Supervision verbal cues. SLP Short Term Goal 5 (Week 2): Pt will utilize word finding strategies in conversation with Modified Independence.  Skilled Therapeutic Interventions: Skilled treatment session focused on addressing cognitive-linguistic goals.  SLP facilitated session with home financial management activity and Min assist verbal cues to write out daily calculations to allow for improved organizing, sequencing, self-monitoring and correcting to complete moderately complex problem solving.  Patient also required Supervision level verbal cues to routine find to and from therapy.   Continue with current plan of care.      FIM:  Comprehension Comprehension Mode: Auditory Comprehension: 5-Understands complex 90% of the time/Cues < 10% of the time Expression Expression Mode: Verbal Expression: 5-Expresses complex 90% of the time/cues < 10% of the time Social Interaction Social Interaction: 6-Interacts appropriately with others with medication or extra time (anti-anxiety, antidepressant). Problem Solving Problem Solving: 4-Solves basic 75 - 89% of the time/requires cueing 10 - 24% of the time Memory Memory: 5-Recognizes or recalls 90% of the time/requires cueing < 10% of the  time  Pain Pain Assessment Pain Assessment: No/denies pain  Therapy/Group: Individual Therapy  [name]., CCC-SLP [phone]  Olivia Payne 07/28/2012, 3:39 PM

## 2012-07-29 ENCOUNTER — Inpatient Hospital Stay (HOSPITAL_COMMUNITY): Payer: Managed Care, Other (non HMO)

## 2012-07-29 ENCOUNTER — Inpatient Hospital Stay (HOSPITAL_COMMUNITY): Payer: Managed Care, Other (non HMO) | Admitting: Speech Pathology

## 2012-07-29 ENCOUNTER — Inpatient Hospital Stay (HOSPITAL_COMMUNITY): Payer: Managed Care, Other (non HMO) | Admitting: *Deleted

## 2012-07-29 ENCOUNTER — Telehealth: Payer: Self-pay | Admitting: Endocrinology

## 2012-07-29 ENCOUNTER — Inpatient Hospital Stay (HOSPITAL_COMMUNITY): Payer: Managed Care, Other (non HMO) | Admitting: Occupational Therapy

## 2012-07-29 LAB — GLUCOSE, CAPILLARY
Glucose-Capillary: 153 mg/dL — ABNORMAL HIGH (ref 70–99)
Glucose-Capillary: 101 mg/dL — ABNORMAL HIGH (ref 70–99)

## 2012-07-29 MED ORDER — METHOCARBAMOL 500 MG PO TABS
750.0000 mg | ORAL_TABLET | Freq: Four times a day (QID) | ORAL | Status: DC | PRN
  Administered 2012-07-30: 750 mg via ORAL
  Filled 2012-07-29: qty 2

## 2012-07-29 NOTE — Progress Notes (Addendum)
Physical Therapy Weekly Progress Note  Patient Details  Name: Olivia Payne MRN: [id number] Date of Birth: 1970-06-12  Today's Date: 07/29/2012 Time: 0900-1000 and 7829-5621 Time Calculation (min): 60 min and 52 min  Patient has met 4 of 5 short term goals.  Pt partially met gait goal; distance varies depending upon pt's knee pain.    Patient continues to demonstrate the following deficits: strength, motor control, balance, gait, and therefore will continue to benefit from skilled PT intervention to enhance overall performance with balance, ability to compensate for deficits, functional use of  right lower extremity and left lower extremity, attention and awareness.  Patient progressing toward long term goals.; gait distance has been decreased, stairs assistance has been increased and a home w/c propulsion goal added. Ramp for home entry is supposed to to be built before d/c.  Continue  plan of care.  Orthotist will assess pt 07/30/12 for L Allard Toe Off brace, and possible R knee sleeve.  PT Short Term Goals Week 2:  PT Short Term Goal 1 (Week 2): Pt will perform furniture transfers to sofa and armchairs of various heights with supervision. PT Short Term Goal 1 - Progress (Week 2): Met PT Short Term Goal 2 (Week 2): Pt will perform gait x 60' with min assist. PT Short Term Goal 2 - Progress (Week 2): Partly met (inconsistent) PT Short Term Goal 3 (Week 2): Pt will ascend/descend 5 stairs with 2 rails with mod assist. PT Short Term Goal 3 - Progress (Week 2): Met PT Short Term Goal 4 (Week 2): pt will propel w/c in home environment x 50' with supervision,  1-2 VCs for safety and L attention. PT Short Term Goal 4 - Progress (Week 2): Met PT Short Term Goal 5 (Week 2): Pt will perform 2- item exercise  program accurately with hand-out, and 1-2 cues. PT Short Term Goal 5 - Progress (Week 2): Met  Skilled Therapeutic Interventions/Progress Updates:     Session 1:  Pt received wearing  [employer]raCare heat patches on bil knees, pain rated 10/10; Voltaren gel withheld by RN until heat patches removed.  W/c propulsion using bil UEs x 150' modified independent on level tile in controlled setting.  In home setting on carpet, w/c propulsion x 50' with supervision, mod cues for negotiating doorways, congested areas.  W/c> bed transfer using RW, close supervision, cues for technique and safety.  In supine, neuromuscular re-education via tactile cues, demo, VCs for isolated, alternating ankld DF, bil hip adduction and bil hip IR against resistance, glut sets.  Pt instructed in working within pain free range for R knee.   Discussed cryotherapy vs heat therapy for bil knees.  Pt is agreeable to trying cryotherapy after gait training in afternoon session.  Session 2:  Pt reported that knees felt better; voltaren gel just applied.  Treatment focused on transfers, gait, consideration for LE bracing, w/c propulsion, cryotherapy for bil knees, including educating pt about this modality.    Gait with RW x 16', limited by R knee pain.  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 Pt believes that she had a bone spur that was crushed within joint, but Xrays do not indicate this.  Discussed options for bracing with [name], [place] and pt.  [name] will assess pt tomorrow for size XL Allard Toe Off for LLE, and neoprene sleeve for R knee.    W/c  propulsion modified independent x 160' including turns in room.  Toilet and bed transfer with supervision, min cues for optimal placement of w/c priop to transfer, min cues for pt management of w/c parts.  Ice packs applied to pt's knees at end of session with pt in bed, bil knees in slight flexion.  Educated pt on duration of icing, filling of ice packs, use of straps.  Therapy Documentation Precautions:  Precautions Precautions: Fall Precaution Comments: LEs buckle, L  hemianopsia and central visual deficits.  Restrictions Weight Bearing Restrictions: No   Pain: Pain Assessment Pain Assessment: 0-10 Pain Score:   7 Pain Type: Chronic pain Pain Location: Knee Pain Orientation: Right;Left Pain Descriptors / Indicators: Aching Pain Frequency: Constant Pain Onset: On-going Patients Stated Pain Goal: 3 Pain Intervention(s): Medication (See eMAR);Repositioned Multiple Pain Sites: No      See FIM for current functional status  Therapy/Group: Individual Therapy  Macarthur Lorusso 07/29/2012, 2:13 PM

## 2012-07-29 NOTE — Progress Notes (Signed)
Occupational Therapy Session Note  Patient Details  Name: Olivia Payne MRN: [id number] Date of Birth: 1970/08/07  Today's Date: 07/29/2012 Time: 0810-0905 Time Calculation (min): 55 min  Short Term Goals: Week 1:  OT Short Term Goal 1 (Week 1): Pt. will will demonstrate  visual accuracy with scanning on word search game OT Short Term Goal 1 - Progress (Week 1): Met OT Short Term Goal 2 (Week 1): Pt. will utilzed AE PRN for LB bathing and dressing  OT Short Term Goal 2 - Progress (Week 1): Met OT Short Term Goal 3 (Week 1): Pt.  will stand for 5 miutes during grooming with no rest break OT Short Term Goal 3 - Progress (Week 1): Met OT Short Term Goal 4 (Week 1): Pt. will transfer to toilet with minimal assist OT Short Term Goal 4 - Progress (Week 1): Met Week 2:  OT Short Term Goal 1 (Week 2): Pt will transfer to toilet from w/c with mod I. OT Short Term Goal 2 (Week 2): Pt will toilet with mod I. OT Short Term Goal 3 (Week 2): Pt will transfer to shower stall with 2 inch ledge with supervision.  Skilled Therapeutic Interventions/Progress Updates:    Pt seen for B/D at shower level with a focus on functional mobility and attention to task.  To increase challenge of ADL session, pt ambulated from bed to shower with RW (versus using w/c).  Pt needed approximately 5-8 minutes to take those steps as she stated her knees were in severe pain. Pt's left knee was buckling a great deal and she was struggling to fully advance it.  She needed mod assisted to ambulate into shower.  Pt reported her knees were in severe pain.  Nurse contacted for pain meds.  Once in shower, pt completed bathing with distant supervision.  Transferred out of shower with w/c with supervision.  She completed the rest of her self care staying on task.  Mod cues to lock breaks of w/c. Pt reports that her bathroom doors are 36 inches wide and she can easily fit a w/c into the bathroom.  Pt's PT had arrived for her next  session.  Therapy Documentation Precautions:  Precautions Precautions: Fall Precaution Comments: LEs buckle, L hemianopsia and central visual deficits.  Restrictions Weight Bearing Restrictions: No    Pain: Pain Assessment Pain Score: 10-Worst pain ever Pain Type: Acute pain Pain Location: Knee Pain Orientation: Right;Left Pain Descriptors / Indicators: Aching Pain Onset: Sudden Patients Stated Pain Goal: 2 Pain Intervention(s): RN made aware ADL:  See FIM for current functional status  Therapy/Group: Individual Therapy  SAGUIER,JULIA 07/29/2012, 10:21 AM

## 2012-07-29 NOTE — Progress Notes (Signed)
Patient ID: Olivia Payne, female   DOB: 09-Feb-1971, 42 y.o.   MRN: [id number] Subjective/Complaints:  Left knee sore now, gives out, grinds. 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   Objective: Vital Signs: Blood pressure 112/64, pulse 91, temperature 98 F (36.7 C), temperature source Oral, resp. rate 20, weight 146.7 kg (323 lb 6.6 oz), last menstrual period 07/07/1992, SpO2 99.00%. No results found. No results found for this basename: WBC, HGB, HCT, PLT,  in the last 72 hours No results found for this basename: NA, K, CL, CO, GLUCOSE, BUN, CREATININE, CALCIUM,  in the last 72 hours CBG (last 3)   Recent Labs  07/28/12 1635 07/28/12 2104 07/29/12 0744  GLUCAP 131* 152* 143*    Wt Readings from Last 3 Encounters:  07/21/12 146.7 kg (323 lb 6.6 oz)  07/11/12 172.6 kg (380 lb 8.2 oz)  07/11/12 172.6 kg (380 lb 8.2 oz)    Physical Exam:  Morbidly obese.  HENT: mild thrush on tongue still Head: Normocephalic and atraumatic.  Eyes: EOM are normal. Pupils are equal, round, and reactive to light.  Neck: No tracheal deviation present. No thyromegaly present.  Cardiovascular: Normal rate and regular rhythm.  Pulmonary/Chest: Effort normal and breath sounds normal.  Abdominal: Soft. Bowel sounds are normal. She exhibits no distension. There is no tenderness.  Musculoskeletal: She exhibits edema.  Valgus and ER of right knee, tender to touch with mild effusion.  Lymphadenopathy:  She has no cervical adenopathy.  Neurological: just awakening. A little slow to arouse but answers all my questions May have impaired vision in central fields. Likely left HH. Impaired judgement with memory deficits noted. Improved sitting balance. Still tangential and distractible. Strength 4/5 RUE and 4- LUE. LE movement still inhibited by knee pain ,right more than left. DTR's 1+, sensation grossly intact in  all 4.  Skin:  Right scalp incision clean and well approximated with suture. Mild scab, dried blood around incision   Assessment/Plan: 1. Functional deficits secondary to AVM s/p repair and eventual cranioplasty with ongoing pain,gait,cognitive issues which require 3+ hours per day of interdisciplinary therapy in a comprehensive inpatient rehab setting. Physiatrist is providing close team supervision and 24 hour management of active medical problems listed below. Physiatrist and rehab team continue to assess barriers to discharge/monitor patient progress toward functional and medical goals.    FIM: FIM - Bathing Bathing Steps Patient Completed: Chest;Right Arm;Left Arm;Abdomen;Front perineal area;Buttocks;Right upper leg;Left upper leg;Right lower leg (including foot);Left lower leg (including foot) Bathing: 5: Supervision: Safety issues/verbal cues  FIM - Upper Body Dressing/Undressing Upper body dressing/undressing steps patient completed: Thread/unthread right bra strap;Thread/unthread left bra strap;Thread/unthread left sleeve of pullover shirt/dress;Thread/unthread right sleeve of pullover shirt/dresss;Put head through opening of pull over shirt/dress;Pull shirt over trunk Upper body dressing/undressing: 4: Min-Patient completed 75 plus % of tasks FIM - Lower Body Dressing/Undressing Lower body dressing/undressing steps patient completed: Thread/unthread right underwear leg;Thread/unthread left underwear leg;Pull underwear up/down;Thread/unthread right pants leg;Thread/unthread left pants leg;Pull pants up/down;Don/Doff right sock;Don/Doff left sock Lower body dressing/undressing: 5: Supervision: Safety issues/verbal cues  FIM - Toileting Toileting steps completed by patient: Adjust clothing prior to toileting;Performs perineal hygiene;Adjust clothing after toileting Toileting Assistive Devices: Grab bar or rail for support Toileting: 5: Supervision: Safety issues/verbal cues  FIM -  Diplomatic Services operational officer Devices: Grab bars Toilet Transfers: 5-To toilet/BSC: Supervision (verbal cues/safety issues);5-From toilet/BSC: Supervision (verbal cues/safety issues)  FIM - Banker Devices: Therapist, occupational: 6: Supine > Sit: No assist;6: Sit > Supine: No assist;0: Activity did not occur  FIM - Locomotion: Wheelchair Distance: 176 Locomotion: Wheelchair: 6: Travels 150 ft or more, turns around, maneuvers to table, bed or toilet, negotiates 3% grade: maneuvers on rugs and over door sills independently FIM - Locomotion: Ambulation Locomotion: Ambulation Assistive Devices: Walker - Rolling (no ACE on L knee) Ambulation/Gait Assistance: 4: Min assist Locomotion: Ambulation: 2: Travels 50 - 149 ft with minimal assistance (Pt.>75%) (52)  Comprehension Comprehension Mode: Auditory Comprehension: 5-Understands complex 90% of the time/Cues < 10% of the time  Expression Expression Mode: Verbal Expression: 5-Expresses complex 90% of the time/cues < 10% of the time  Social Interaction Social Interaction: 6-Interacts appropriately with others with medication or extra time (anti-anxiety, antidepressant).  Problem Solving Problem Solving: 4-Solves basic 75 - 89% of the time/requires cueing 10 - 24% of the time  Memory Memory: 5-Recognizes or recalls 90% of the time/requires cueing < 10% of the time  Medical Problem List and Plan:  1. DVT Prophylaxis/Anticoagulation: Pharmaceutical: Lovenox.  2. Facial pain/chronic back pain/chronic right knee pain/ headaches: Will continue topamax and lyrica for facial pain/headache. .  Voltaren gel to help with knee pain. Ok to use icy hot patches to back daily.   - toradol prn  -overall pain/headaches are much improved  -orthotist to work with PT regarding a potential brace to help stabilize the left knee 3. Anxiety/Depression: continue cymbalta. Will need a lot of ego support by  team. Seems anxious and distracted by multiple somatic complaints and poor insight/awareness.  4. Neuropsych: This patient is capable of making decisions on her own behalf.  5. DM type 2: Will monitor with AC/HS cbg checks. Increased metformin to 1000mg  xr  -improving control-  -watch with steroid taper to off over next 4days 6. HTN: Will monitor with bid checks.  7. Morbid obesity:   educate patient regarding weight loss/ diet. Routine pressure relief measures 8.  R knee pain, showing improvement 9. Thrush- diflucan completed  -nystatin  LOS (Days) 13 A FACE TO FACE EVALUATION WAS PERFORMED  Montford Barg T 07/29/2012 9:46 AM

## 2012-07-29 NOTE — Progress Notes (Signed)
Speech Language Pathology Daily Session Note  Patient Details  Name: Olivia Payne MRN: [id number] Date of Birth: 12-30-1970  Today's Date: 07/29/2012 Time: 4098-1191 Time Calculation (min): 25 min  Short Term Goals: Week 2: SLP Short Term Goal 1 (Week 2): Pt will demonstrate selective attention for at least 10 minutes during a functional task with Min clinician cues for redirection SLP Short Term Goal 2 (Week 2): Pt will utilize external memory aids to recall new, daily information with Min cues from SLP. SLP Short Term Goal 3 (Week 2): Pt will utilize working memory strategies during functional tasks with Engineer, maintenance cues. SLP Short Term Goal 4 (Week 2): Pt will demonstrate complex problem solving with Supervision verbal cues. SLP Short Term Goal 5 (Week 2): Pt will utilize word finding strategies in conversation with Modified Independence.  Skilled Therapeutic Interventions: Skilled treatment session focused on addressing discharge planning.  Upon entering room patient at computer with questions regarding which program she should buy.  SLP informed her that therapy tasks and recommended tasks will be sufficient at addressing her cognitive-linguistic goals.  Patient with numerouse questions regarding what and where follow up services are available to her after dischrage.  SLP informed her that PT, OT, SLP and Neurophyschology services are recommended and patient and family should discuss which location is best for them.  Patient appeared to be anxious about discharge and SLP recommended not worrying that there will be time to figure these things out and to try and perform some deep breathing to relax.  Continue with current plan of care.      FIM:  Comprehension Comprehension Mode: Auditory Comprehension: 5-Understands complex 90% of the time/Cues < 10% of the time Expression Expression Mode: Verbal Expression: 5-Expresses complex 90% of the time/cues < 10% of the time Social  Interaction Social Interaction: 6-Interacts appropriately with others with medication or extra time (anti-anxiety, antidepressant). Problem Solving Problem Solving: 4-Solves basic 75 - 89% of the time/requires cueing 10 - 24% of the time Memory Memory: 5-Recognizes or recalls 90% of the time/requires cueing < 10% of the time FIM - Eating Eating Activity: 7: Complete independence:no helper  Pain Pain Assessment Pain Assessment: 0-10 Pain Score:   7 Pain Type: Chronic pain Pain Location: Knee Pain Orientation: Right;Left Pain Descriptors / Indicators: Aching Pain Frequency: Constant Pain Onset: On-going Patients Stated Pain Goal: 3 Pain Intervention(s): Medication (See eMAR);Repositioned Multiple Pain Sites: No  Therapy/Group: Individual Therapy  [name]., CCC-SLP [phone]  [name] 07/29/2012, 2:21 PM

## 2012-07-30 ENCOUNTER — Inpatient Hospital Stay (HOSPITAL_COMMUNITY): Payer: Managed Care, Other (non HMO) | Admitting: Occupational Therapy

## 2012-07-30 ENCOUNTER — Inpatient Hospital Stay (HOSPITAL_COMMUNITY): Payer: Managed Care, Other (non HMO)

## 2012-07-30 ENCOUNTER — Inpatient Hospital Stay (HOSPITAL_COMMUNITY): Payer: Managed Care, Other (non HMO) | Admitting: Speech Pathology

## 2012-07-30 DIAGNOSIS — R269 Unspecified abnormalities of gait and mobility: Secondary | ICD-10-CM

## 2012-07-30 DIAGNOSIS — M1710 Unilateral primary osteoarthritis, unspecified knee: Secondary | ICD-10-CM

## 2012-07-30 DIAGNOSIS — Q048 Other specified congenital malformations of brain: Secondary | ICD-10-CM

## 2012-07-30 LAB — GLUCOSE, CAPILLARY
Glucose-Capillary: 113 mg/dL — ABNORMAL HIGH (ref 70–99)
Glucose-Capillary: 223 mg/dL — ABNORMAL HIGH (ref 70–99)

## 2012-07-30 NOTE — Progress Notes (Signed)
Physical Therapy Note  Patient Details  Name: Olivia Payne MRN: [id number] Date of Birth: 1970-09-11 Today's Date: 07/30/2012  10 - 11 60 minutes Individual session Patient reports pain in knee - about a 6 or 7. Patient reports it was a 15 earlier but meds given.  Treatment session consisted of meeting with [name], CO and pt regarding bracing options for left ankle and right knee. Patient fitted with hinged brace for right knee. Patient evaluated with Allard toe off brace. Patient continued to have difficulty with knee control. Toe off brace tended to throw knee forward out of hyperextension when unweighted on left. This startled patient and made her anxious about using the brace. Patient had difficulty progressing left LE with brace on. Worked on standing at parallel bar to get hip flexion on left. Patient ambulated 20 feet with rolling walker and toe off brace with wedge on left and hinged brace on left and min assist. After discussion with [name] and patient, the better option to assist patient with knee control is a custom AFO. Patient was left with [name] to be casted for brace.    Arelia Longest M 07/30/2012, 12:23 PM

## 2012-07-30 NOTE — Progress Notes (Signed)
Speech Language Pathology Weekly Progress Note  Patient Details  Name: TALENE GLASTETTER MRN: [id number] Date of Birth: 19-Dec-1970  Today's Date: 07/30/2012  Short Term Goals: Week 2: SLP Short Term Goal 1 (Week 2): Pt will demonstrate selective attention for at least 10 minutes during a functional task with Min clinician cues for redirection SLP Short Term Goal 1 - Progress (Week 2): Met SLP Short Term Goal 2 (Week 2): Pt will utilize external memory aids to recall new, daily information with Min cues from SLP. SLP Short Term Goal 2 - Progress (Week 2): Met SLP Short Term Goal 3 (Week 2): Pt will utilize working memory strategies during functional tasks with Engineer, maintenance cues. SLP Short Term Goal 3 - Progress (Week 2): Met SLP Short Term Goal 4 (Week 2): Pt will demonstrate complex problem solving with Supervision verbal cues. SLP Short Term Goal 4 - Progress (Week 2): Progressing toward goal SLP Short Term Goal 5 (Week 2): Pt will utilize word finding strategies in conversation with Modified Independence. SLP Short Term Goal 5 - Progress (Week 2): Progressing toward goal Week 3: SLP Short Term Goal 1 (Week 3): Pt will demonstrate alternating attention for at least 10 minutes during a functional task with Min clinician cues for redirection SLP Short Term Goal 2 (Week 3): Pt will utilize external memory aids to recall new, daily information with  Supervision cues from SLP. SLP Short Term Goal 3 (Week 3): Pt will utilize working memory strategies during functional tasks with Supervision clinician cues. SLP Short Term Goal 4 (Week 3): Pt will demonstrate complex problem solving with Supervision verbal cues. SLP Short Term Goal 5 (Week 3): Pt will utilize word finding strategies in conversation with Modified Independence.  Weekly Progress Updates: Patient met 3 out of 5 short term goals this reporting period due to gains in selective attention, recall with regards to working memory strategies as  well as use of external aids to assist with recall of daily information with less cues.  Patient also made gains in word finding and problem solving; however, she continues to require significant cues to perform self-care tasks as a result of these deficits.  As a result, it is recommended that she continues to receive skilled SLP services to address these deficits, maximize functional independence and reduce burden of care prior to discharge home with 24/7 family supervision.    SLP Intensity: Minumum of 1-2 x/day, 30 to 90 minutes SLP Frequency: 5 out of 7 days SLP Treatment/Interventions: Cognitive remediation/compensation;Cueing hierarchy;Environmental controls;Functional tasks;Internal/external aids;Medication managment;Patient/family education;Speech/Language facilitation;Therapeutic Activities  [name]., CCC-SLP [phone]  Dorien Mayotte 07/30/2012, 2:56 PM

## 2012-07-30 NOTE — Progress Notes (Signed)
Occupational Therapy Session Notes  Patient Details  Name: Olivia Payne MRN: [id number] Date of Birth: [birth year]  Today's Date: 07/30/2012 Time: 0805-0900 and 130-215 Time Calculation (min): 55 min and 45 min  Short Term Goals: Week 2:  OT Short Term Goal 1 (Week 2): Pt will transfer to toilet from w/c with mod I. OT Short Term Goal 2 (Week 2): Pt will toilet with mod I. OT Short Term Goal 3 (Week 2): Pt will transfer to shower stall with 2 inch ledge with supervision.  Skilled Therapeutic Interventions/Progress Updates:  1)  Self care retraining to include toileting, toilet and shower transfer, bath, dress and groom.  Focus session on stand-step transfers bed>w/c><toilet, w/c><shower bench without use of walker due to BLE knee pain and weakness, activity tolerance, sit><stand for toileting, LB bath and dress., seated in w/c for grooming.  Patient reports that all of the doorways she needs to enter at home will be accessible from a w/c.  Patient with min vcs to lock breaks on w/c several times during session and after she was out of the shower dried off and partially dressed, she realized that she forgot to rinse the shampoo out of her hair.  Patient undressed and got back into the shower.  Reviewed memory concerns and she agrees that her memory is a big problem to gain independence.  2)  Patient in bed and very lethargic after napping (question medication?).  Patient up to w/c and discussed DME needs for the shower.  Patient presented a drawing of her shower with dimensions. We have an [employer] shower chair on the unit that fits in the space unfortunately, it has a weight limit of 315 lbs.  Given patient's weight (323 lbs.) and the open space in the shower stall, patient plans to find a shower chair that will meet both of those constraints.  SW notified that patient declines to use the tub/shower combo at home because it is her son's and she wants to use her own.  Patient also understands that  the sliding doors will have to be (temporarily) removed and a shower curtain put up to allow safe entry and exit.  Patient may need a wide and heavy duty 3 n 1 commode to use over her standard commode to elevate the surface and allow her to use her arm rests.  Therapy Documentation Precautions:  Precautions Precautions: Fall Precaution Comments: LEs buckle, L hemianopsia and central visual deficits.  Restrictions Weight Bearing Restrictions: No Pain: 1)  9/10 premedicated, earlier in AM patient reported 12-15 rating "not even on the scale" 2)  No report of pain ADL: See FIM for current functional status  Therapy/Group: Individual Therapy  Nichole Keltner 07/30/2012, 9:16 AM

## 2012-07-30 NOTE — Progress Notes (Signed)
Dr. Riley Kill administered an injection to the right knee of the patient at approximately 1700:Marland Kitchen

## 2012-07-30 NOTE — Progress Notes (Signed)
Social Work Patient ID: Olivia Payne, female   DOB: 05-Jan-1971, 42 y.o.   MRN: [id number] Pt requires a wheelchair to be able to propel around the home and use in the community.  She is is only ambulating 3-5 feet and  This is not functional for her household.  Her insurance aware of the recommendation.

## 2012-07-30 NOTE — Progress Notes (Addendum)
Patient ID: Olivia Payne, female   DOB: Nov 24, 1970, 42 y.o.   MRN: [id number] Subjective/Complaints:  Left knee gives out. Right knee still hurts.  A 12 point review of systems has been performed and if not noted above is otherwise negative.   Objective: Vital Signs: Blood pressure 126/85, pulse 85, temperature 97.6 F (36.4 C), temperature source Oral, resp. rate 18, weight 146.7 kg (323 lb 6.6 oz), last menstrual period 07/07/1992, SpO2 98.00%. No results found. No results found for this basename: WBC, HGB, HCT, PLT,  in the last 72 hours No results found for this basename: NA, K, CL, CO, GLUCOSE, BUN, CREATININE, CALCIUM,  in the last 72 hours CBG (last 3)   Recent Labs  07/29/12 1657 07/29/12 2053 07/30/12 0711  GLUCAP 101* 152* 128*    Wt Readings from Last 3 Encounters:  07/21/12 146.7 kg (323 lb 6.6 oz)  07/11/12 172.6 kg (380 lb 8.2 oz)  07/11/12 172.6 kg (380 lb 8.2 oz)    Physical Exam:  Morbidly obese.  HENT: mild thrush on tongue still Head: Normocephalic and atraumatic.  Eyes: EOM are normal. Pupils are equal, round, and reactive to light.  Neck: No tracheal deviation present. No thyromegaly present.  Cardiovascular: Normal rate and regular rhythm.  Pulmonary/Chest: Effort normal and breath sounds normal.  Abdominal: Soft. Bowel sounds are normal. She exhibits no distension. There is no tenderness.  Musculoskeletal: She exhibits edema.  Valgus and ER of right knee, tender to touch with mild effusion.  Lymphadenopathy:  She has no cervical adenopathy.  Neurological: just awakening. A little slow to arouse but answers all my questions May have impaired vision in central fields. Likely left HH. Impaired judgement with memory deficits noted. Improved sitting balance. Still tangential and distractible. Strength 4/5 RUE and 4- LUE. LE movement still inhibited by knee pain ,right more than left. DTR's 1+, sensation grossly intact in all 4.  Skin:  Right scalp  incision clean and well approximated with suture. Mild scab, dried blood around incision   Assessment/Plan: 1. Functional deficits secondary to AVM s/p repair and eventual cranioplasty with ongoing pain,gait,cognitive issues which require 3+ hours per day of interdisciplinary therapy in a comprehensive inpatient rehab setting. Physiatrist is providing close team supervision and 24 hour management of active medical problems listed below. Physiatrist and rehab team continue to assess barriers to discharge/monitor patient progress toward functional and medical goals.    FIM: FIM - Bathing Bathing Steps Patient Completed: Chest;Right Arm;Left Arm;Abdomen;Front perineal area;Buttocks;Right upper leg;Left upper leg;Right lower leg (including foot);Left lower leg (including foot) Bathing: 5: Supervision: Safety issues/verbal cues  FIM - Upper Body Dressing/Undressing Upper body dressing/undressing steps patient completed: Thread/unthread right bra strap;Thread/unthread left bra strap;Hook/unhook bra;Thread/unthread right sleeve of pullover shirt/dresss;Thread/unthread left sleeve of pullover shirt/dress;Put head through opening of pull over shirt/dress;Pull shirt over trunk Upper body dressing/undressing: 6: More than reasonable amount of time FIM - Lower Body Dressing/Undressing Lower body dressing/undressing steps patient completed: Thread/unthread right underwear leg;Thread/unthread left underwear leg;Pull underwear up/down;Thread/unthread right pants leg;Thread/unthread left pants leg;Pull pants up/down;Don/Doff right sock;Don/Doff left sock Lower body dressing/undressing: 5: Supervision: Safety issues/verbal cues  FIM - Toileting Toileting steps completed by patient: Adjust clothing prior to toileting;Performs perineal hygiene;Adjust clothing after toileting Toileting Assistive Devices: Grab bar or rail for support Toileting: 5: Supervision: Safety issues/verbal cues  FIM - Ambulance person Devices: Grab bars Toilet Transfers: 5-To toilet/BSC: Supervision (verbal cues/safety issues);5-From toilet/BSC: Supervision (verbal cues/safety issues)  FIM - Bed/Chair  Transport planner Devices: Arm rests Bed/Chair Transfer: 6: Sit > Supine: No assist;5: Chair or W/C > Bed: Supervision (verbal cues/safety issues)  FIM - Locomotion: Wheelchair Distance: 176 Locomotion: Wheelchair: 6: Travels 150 ft or more, turns around, maneuvers to table, bed or toilet, negotiates 3% grade: maneuvers on rugs and over door sills independently FIM - Locomotion: Ambulation Locomotion: Ambulation Assistive Devices: Walker - Rolling;Orthosis (ACE L knee) Ambulation/Gait Assistance: 4: Min guard Locomotion: Ambulation: 1: Travels less than 50 ft with minimal assistance (Pt.>75%)  Comprehension Comprehension Mode: Auditory Comprehension: 7-Follows complex conversation/direction: With no assist  Expression Expression Mode: Verbal Expression: 7-Expresses complex ideas: With no assist  Social Interaction Social Interaction: 7-Interacts appropriately with others - No medications needed.  Problem Solving Problem Solving: 7-Solves complex problems: Recognizes & self-corrects  Memory Memory: 7-Complete Independence: No helper  Medical Problem List and Plan:  1. DVT Prophylaxis/Anticoagulation: Pharmaceutical: Lovenox.  2. Facial pain/chronic back pain/chronic right knee pain/ headaches: Will continue topamax and lyrica for facial pain/headache. .  Voltaren gel to help with knee pain. Ok to use icy hot patches to back daily.   - toradol prn  -overall pain/headaches are much improved  -orthotist to work with PT regarding a potential brace to help stabilize the left knee---might also benefit from right knee neoprene sleeve 3. Anxiety/Depression: continue cymbalta. Will need a lot of ego support by team. Seems anxious and distracted by multiple somatic  complaints and poor insight/awareness.  4. Neuropsych: This patient is capable of making decisions on her own behalf.  5. DM type 2: Will monitor with AC/HS cbg checks. Increased metformin to 1000mg  xr  -improving control-  -steroid taper to off 6. HTN: Will monitor with bid checks.  7. Morbid obesity:   educate patient regarding weight loss/ diet. Routine pressure relief measures 8.  R knee pain, showing improvement but still a problem. Today, the right knee was injected via lateral approach with 60 mg kenalog and 4 cc 1% lidocaine, the patient tolerated well. Area was cleaned and dressed after injection. 9. Thrush- diflucan completed  -nystatin  LOS (Days) 14 A FACE TO FACE EVALUATION WAS PERFORMED  SWARTZ,ZACHARY T 07/30/2012 9:56 AM

## 2012-07-30 NOTE — Progress Notes (Signed)
Speech Language Pathology Daily Session Note  Patient Details  Name: Olivia Payne MRN: [id number] Date of Birth: 27-Feb-1971  Today's Date: 07/30/2012 Time: 1415-1500 Time Calculation (min): 45 min  Short Term Goals: Week 3: SLP Short Term Goal 1 (Week 3): Pt will demonstrate alternating attention for at least 10 minutes during a functional task with Min clinician cues for redirection SLP Short Term Goal 2 (Week 3): Pt will utilize external memory aids to recall new, daily information with  Supervision cues from SLP. SLP Short Term Goal 3 (Week 3): Pt will utilize working memory strategies during functional tasks with Supervision clinician cues. SLP Short Term Goal 4 (Week 3): Pt will demonstrate complex problem solving with Supervision verbal cues. SLP Short Term Goal 5 (Week 3): Pt will utilize word finding strategies in conversation with Modified Independence.  Skilled Therapeutic Interventions: Skilled treatment session focused on cognitive-linguistic goals. SLP facilitated session by providing Mod question cues for utilization of the compensatory strategy of description to increase word-finding with functional items. Pt able to name functional items when provided description of functional items by clinician with 100% accuracy.    FIM:  Comprehension Comprehension Mode: Auditory Comprehension: 5-Understands complex 90% of the time/Cues < 10% of the time Expression Expression Mode: Verbal Expression: 5-Expresses basic 90% of the time/requires cueing < 10% of the time. Social Interaction Social Interaction: 6-Interacts appropriately with others with medication or extra time (anti-anxiety, antidepressant). Problem Solving Problem Solving: 4-Solves basic 75 - 89% of the time/requires cueing 10 - 24% of the time Memory Memory: 5-Recognizes or recalls 90% of the time/requires cueing < 10% of the time  Pain Pain Assessment Pain Assessment: No/denies pain   Therapy/Group: Individual  Therapy  [name] 07/30/2012, 3:18 PM

## 2012-07-31 ENCOUNTER — Inpatient Hospital Stay (HOSPITAL_COMMUNITY): Payer: Managed Care, Other (non HMO)

## 2012-07-31 LAB — GLUCOSE, CAPILLARY
Glucose-Capillary: 218 mg/dL — ABNORMAL HIGH (ref 70–99)
Glucose-Capillary: 139 mg/dL — ABNORMAL HIGH (ref 70–99)
Glucose-Capillary: 187 mg/dL — ABNORMAL HIGH (ref 70–99)

## 2012-07-31 NOTE — Progress Notes (Signed)
Occupational Therapy Session Note  Patient Details  Name: Olivia Payne MRN: [id number] Date of Birth: 01-08-1971  Today's Date: 07/31/2012 Time: 1430-1530 Time Calculation (min): 60 min  Short Term Goals: Week 2:  OT Short Term Goal 1 (Week 2): Pt will transfer to toilet from w/c with mod I. OT Short Term Goal 2 (Week 2): Pt will toilet with mod I. OT Short Term Goal 3 (Week 2): Pt will transfer to shower stall with 2 inch ledge with supervision.  Skilled Therapeutic Interventions/Progress Updates: Therapeutic exercises with emphasis on strengthening shoulder girdle and tricep using universal gym (PRE).   Patient initially reported severe right knee pain with confusion relating to correct orientation and sequence with donning right knee brace.  With repetition of instructions, patient was able to recite donning hint as follows: "writing on the bottom and tag in the back."   Patient was then escorted to ortho gym to perform 3 sets of 10 reps (shoulder elevation/depression @ 35 lbs duplicating "chin ups", shoulder protraction/retraction @ 15 lbs duplicating "rowing", elbow flexion/extension 5 lbs @ L - 2.5 lbs @ R, alternating, first left 10 reps then right, duplicating "tricep pushdowns").  Patient son attended gym workout and reports that patient has an exercise machine at home that he will re-educate his mother on s/p discharge.   Therapy Documentation Precautions:  Precautions Precautions: Fall Precaution Comments: LEs buckle, L hemianopsia and central visual deficits.  Restrictions Weight Bearing Restrictions: No  Pain: Pain Assessment Pain Assessment: 0-10 Pain Score:   7 Pain Type: Chronic pain Pain Location: Knee Pain Orientation: Right Pain Descriptors / Indicators: Constant;Stabbing Pain Onset: With Activity Patients Stated Pain Goal: 3 Pain Intervention(s): Medication (See eMAR);Splinting;Distraction Multiple Pain Sites: No  See FIM for current functional  status  Therapy/Group: Individual Therapy  Georgeanne Nim 07/31/2012, 3:36 PM

## 2012-07-31 NOTE — Progress Notes (Signed)
Patient ID: Olivia Payne, female   DOB: [birth year], 42 y.o.   MRN: [id number] Subjective/Complaints:  Left knee gives out. Right knee still hurts.  A 12 point review of systems has been performed and if not noted above is otherwise negative.   Objective: Vital Signs: Blood pressure 101/62, pulse 95, temperature 99.2 F (37.3 C), temperature source Oral, resp. rate 17, weight 323 lb 6.6 oz (146.7 kg), last menstrual period 07/07/1992, SpO2 96.00%. CBG (last 3)   Recent Labs  07/30/12 1728 07/30/12 2112 07/31/12 0726  GLUCAP 113* 223* 218*    Wt Readings from Last 3 Encounters:  07/21/12 323 lb 6.6 oz (146.7 kg)  07/11/12 380 lb 8.2 oz (172.6 kg)  07/11/12 380 lb 8.2 oz (172.6 kg)    Physical Exam:  Morbidly obese.  No acute distress. Chest clear to auscultation Cardiac exam S1 and S2 are regular Abdominal exam obese, and bowel sounds, soft. Extremities no edema Musculoskeletal: No joint effusions at the knee.   Assessment/Plan: 1. Functional deficits secondary to AVM s/p repair and eventual cranioplasty with ongoing pain,gait,cognitive issues   Medical Problem List and Plan:  1. DVT Prophylaxis/Anticoagulation: Pharmaceutical: Lovenox.  2. Facial pain/chronic back pain/chronic right knee pain/ headaches:  She denies facial pain today. The pain is better after injection. 3. Anxiety/Depression: continue cymbalta. She is frequently distracted during the interview. 4. Neuropsych: This patient is capable of making decisions on her own behalf.  5. DM type 2: Main issue is obesity. She is eating a bag of pretzels during the interview. She really should not be any outside foods. She should be eating a diabetic diet only. 6. HTN: Well controlled. BP Readings from Last 3 Encounters:  07/31/12 101/62  07/16/12 118/60  07/16/12 118/60    7. Morbid obesity:   educate patient regarding weight loss/ diet. Routine pressure relief measures 8.  R knee pain, improved. 9. Thrush-  diflucan completed  -nystatin  LOS (Days) 15 A FACE TO FACE EVALUATION WAS PERFORMED  [facility] HENRY 07/31/2012 7:57 AM

## 2012-08-01 ENCOUNTER — Inpatient Hospital Stay (HOSPITAL_COMMUNITY): Payer: Managed Care, Other (non HMO) | Admitting: *Deleted

## 2012-08-01 LAB — GLUCOSE, CAPILLARY
Glucose-Capillary: 145 mg/dL — ABNORMAL HIGH (ref 70–99)
Glucose-Capillary: 110 mg/dL — ABNORMAL HIGH (ref 70–99)
Glucose-Capillary: 241 mg/dL — ABNORMAL HIGH (ref 70–99)

## 2012-08-01 MED ORDER — ACETAMINOPHEN 325 MG PO TABS
650.0000 mg | ORAL_TABLET | Freq: Three times a day (TID) | ORAL | Status: DC
  Administered 2012-08-01 – 2012-08-03 (×8): 650 mg via ORAL
  Filled 2012-08-01 (×8): qty 2

## 2012-08-01 NOTE — Progress Notes (Addendum)
Patient ID: Olivia Payne, female   DOB: May 12, 1970, 42 y.o.   MRN: [id number] Subjective/Complaints:  She generally feels well. Right knee pain is worse today   Objective: Vital Signs: Blood pressure 123/73, pulse 76, temperature 98.1 F (36.7 C), temperature source Oral, resp. rate 20, weight 323 lb 6.6 oz (146.7 kg), last menstrual period 07/07/1992, SpO2 97.00%. CBG (last 3)   Recent Labs  07/31/12 1136 07/31/12 1633 07/31/12 2045  GLUCAP 139* 187* 171*    Wt Readings from Last 3 Encounters:  07/21/12 323 lb 6.6 oz (146.7 kg)  07/11/12 380 lb 8.2 oz (172.6 kg)  07/11/12 380 lb 8.2 oz (172.6 kg)    Physical Exam:  Morbidly obese female in no acute distress. Chest clear to auscultation. Cardiac exam S1-S2 are regular. Abdominal exam obese, and bowel sounds, soft. Extremities no pretibial edema. Musculoskeletal exam there are no obvious joint effusions at the right knee. The knee is painful with any movement. Minimal pain to palapation   Assessment/Plan: 1. Functional deficits secondary to AVM s/p repair and eventual cranioplasty with ongoing pain,gait,cognitive issues   Medical Problem List and Plan:  1. DVT Prophylaxis/Anticoagulation: Pharmaceutical: Lovenox.  2. Facial pain/chronic back pain/chronic right knee pain/ headaches:  She denies facial pain today. The knee pain is better after injection. 3. Anxiety/Depression: continue cymbalta.  4. Neuropsych: This patient is capable of making decisions on her own behalf.  5. DM type 2: Main issue is obesity. 6. HTN:  7. Morbid obesity:   educate patient regarding weight loss/ die Blood pressure continues to be adequately controlled. Blood pressure ranged in the past 24 hours 123/73-160/93. 8.  R knee pain, worsened today. Continue voltaren gel. Will schedule acetaminophen 9. Thrush- diflucan completed  -nystatin began Jul 28, 2012, treatment scheduled for 10 days.  LOS (Days) 16 A FACE TO FACE EVALUATION WAS  PERFORMED  [facility] 08/01/2012 6:57 AM

## 2012-08-01 NOTE — Progress Notes (Signed)
Occupational Therapy Note  Patient Details  Name: Olivia Payne MRN: [id number] Date of Birth: 04-May-1970 Today's Date: 08/01/2012  Time:  1530-1600  (45 min) Pain:  Right knee pain= 10/10 with movement; 3/10 without movement Individual session  Pt. Lying in bed upon OT arrival.  Pt. Complained about right knee really bothering her.  She had a knee injection on Friday.  She said it has not helped at all.  Addressed bed mobility, visual scanning, problem solving, memory, toilet transfers.  Pt. Focus on several activities (nerf football, work Financial controller).  Pt. Reported she had problems with memoryand describing things>  Pt. Transferred from EOB to wc with supervision and then to regular toilet with supervision.  She did stand pivot. Pt. Demonstrated decreased sustained attention and jumped around from  One subject to another. Humberto Seals 08/01/2012, 5:33 PM

## 2012-08-01 NOTE — Progress Notes (Signed)
Teaching done this am regarding medications and mood altering medications as well as anti-seizure medications. Patient up to bathroom with one assist.  Patient discussed anxiety with hospitalization and changes in abilities. Comfort given. Sherlyn Lees, RN

## 2012-08-02 ENCOUNTER — Inpatient Hospital Stay (HOSPITAL_COMMUNITY): Payer: Managed Care, Other (non HMO)

## 2012-08-02 ENCOUNTER — Inpatient Hospital Stay (HOSPITAL_COMMUNITY): Payer: Managed Care, Other (non HMO) | Admitting: Occupational Therapy

## 2012-08-02 ENCOUNTER — Inpatient Hospital Stay (HOSPITAL_COMMUNITY): Payer: Managed Care, Other (non HMO) | Admitting: Speech Pathology

## 2012-08-02 LAB — GLUCOSE, CAPILLARY: Glucose-Capillary: 150 mg/dL — ABNORMAL HIGH (ref 70–99)

## 2012-08-02 NOTE — Progress Notes (Signed)
Social Work Patient ID: Olivia Payne, female   DOB: [birth year], 42 y.o.   MRN: [id number] Spoke with Creola Corn Case Manager who has approved pt until 6/2 with discharge 6/3.  Will talk with tomorrow to confirm discharge tomorrow.

## 2012-08-02 NOTE — Progress Notes (Signed)
Occupational Therapy Session Note  Patient Details  Name: Olivia Payne MRN: [id number] Date of Birth: [birth year]  Today's Date: 08/02/2012 Time: 0805-0900 and 1105 Time Calculation (min): 55 min and 1135  Short Term Goals: Week 1:  OT Short Term Goal 1 (Week 1): Pt. will will demonstrate  visual accuracy with scanning on word search game OT Short Term Goal 1 - Progress (Week 1): Met OT Short Term Goal 2 (Week 1): Pt. will utilzed AE PRN for LB bathing and dressing  OT Short Term Goal 2 - Progress (Week 1): Met OT Short Term Goal 3 (Week 1): Pt.  will stand for 5 miutes during grooming with no rest break OT Short Term Goal 3 - Progress (Week 1): Met OT Short Term Goal 4 (Week 1): Pt. will transfer to toilet with minimal assist OT Short Term Goal 4 - Progress (Week 1): Met Week 2:  OT Short Term Goal 1 (Week 2): Pt will transfer to toilet from w/c with mod I. OT Short Term Goal 2 (Week 2): Pt will toilet with mod I. OT Short Term Goal 3 (Week 2): Pt will transfer to shower stall with 2 inch ledge with supervision.  Skilled Therapeutic Interventions/Progress Updates:    Visit 1:   Pt seen for BADL retraining of toileting, bathing, and dressing with a focus on family education with pt's husband and daughter and safe and functional transfers.  Due to knee pain and instability, pt is encouraged to complete w/c transfers versus walker transfers.  Her husband discussed his plans to modify the bathroom to allow her to access it.  If she needs to use RW to toilet or shower stall prior to modifications, she will need to bring chair right to bathroom door and her spouse will need to assist her if her knees begin to buckle. From, w/c level pt is mod I to supervision with self care. Pt's PT arrived for her next session.  Visit 2: Pt seen this session for continued family education in regards to shower chair recommendations and outpt OT recommendations to increase [facility] and strength.  Also discussed safety  in the home with transfers and home activity suggestions.  Pt stated that she feels that she is ready for discharge tomorrow.   Therapy Documentation Precautions:  Precautions Precautions: Fall Precaution Comments: LEs buckle, L hemianopsia and central visual deficits.  Restrictions Weight Bearing Restrictions: No  ADL:  See FIM for current functional status  Therapy/Group: Individual Therapy  Jamarcus Laduke 08/02/2012, 10:27 AM

## 2012-08-02 NOTE — Progress Notes (Signed)
Speech Language Pathology Daily Session Note  Patient Details  Name: Olivia Payne MRN: [id number] Date of Birth: [birth year]  Today's Date: 08/02/2012 Time: 1500-1530 Time Calculation (min): 30 min  Short Term Goals: Week 3: SLP Short Term Goal 1 (Week 3): Pt will demonstrate alternating attention for at least 10 minutes during a functional task with Min clinician cues for redirection SLP Short Term Goal 2 (Week 3): Pt will utilize external memory aids to recall new, daily information with  Supervision cues from SLP. SLP Short Term Goal 3 (Week 3): Pt will utilize working memory strategies during functional tasks with Supervision clinician cues. SLP Short Term Goal 4 (Week 3): Pt will demonstrate complex problem solving with Supervision verbal cues. SLP Short Term Goal 5 (Week 3): Pt will utilize word finding strategies in conversation with Modified Independence.  Skilled Therapeutic Interventions: Treatment focus on family education in regards to current cognitive-linguistic deficits and strategies to utilize at home to increase word-finding, problem solving, working Hotel manager.  The pt and her family members verbalized and demonstrated understanding. Handouts were also given to reinforce information/strategies.     FIM:  Comprehension Comprehension Mode: Auditory Comprehension: 5-Follows basic conversation/direction: With extra time/assistive device Expression Expression Mode: Verbal Expression: 5-Expresses basic needs/ideas: With no assist Social Interaction Social Interaction: 5-Interacts appropriately 90% of the time - Needs monitoring or encouragement for participation or interaction. Problem Solving Problem Solving: 5-Solves basic problems: With no assist Memory Memory: 4-Recognizes or recalls 75 - 89% of the time/requires cueing 10 - 24% of the time  Pain Pain Assessment Pain Assessment: No/denies pain    Therapy/Group: Individual Therapy  Jaideep Pollack,  Concettina Leth 08/02/2012, 5:26 PM

## 2012-08-02 NOTE — Progress Notes (Signed)
Occupational Therapy Discharge Summary  Patient Details  Name: Olivia Payne MRN: [id number] Date of Birth: June 01, 1970  Today's Date: 08/02/2012  Patient has met 11 of 11 long term goals due to improved activity tolerance, improved balance, ability to compensate for deficits and functional use of  RIGHT upper extremity.  Patient to discharge at overall Supervision level.  Patient's care partner is independent to provide the necessary physical and cognitive assistance at discharge.    Reasons goals not met: n/a  Recommendation:  Patient will benefit from ongoing skilled OT services in outpatient setting to continue to advance functional skills in the area of iADL and Vocation with a focus on BUE FMC and UE strength.  Equipment: No equipment provided - Pt's husband will purchase a shower chair.  Reasons for discharge: treatment goals met  Patient/family agrees with progress made and goals achieved: Yes  OT Discharge  ADL  Supervision with shower transfers and dressing. Mod I with bathing and toileting and toilet transfers from w/c level only.  Pt needs min assist for short distances with RW, but she often has too much knee pain to be able to ambulate safely. 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 She no longer has light sensitivity and improved depth perception. Perception Inattention/Neglect: Appears intact Spatial Orientation: functional for basic self care Praxis Praxis: Intact  Cognition Orientation Level: Oriented X4 Memory: Impaired Memory Impairment: Decreased recall of new information;Retrieval deficit;Decreased short term memory Sensation Sensation Light Touch: Impaired by gross assessment (decreased in finger tips) Stereognosis: Impaired by gross assessment Hot/Cold: Appears Intact Coordination Gross Motor Movements are Fluid and Coordinated: No Fine Motor Movements are Fluid and  Coordinated: No Coordination and Movement Description: Decreased accuracy and speed in UEs and LEs Motor  Motor Motor - Discharge Observations: Pt has been very inconsistent with her motor performance.  She often has severe weakness in LEs with ataxic motor pattern. Mobility    Refer to FIM Trunk/Postural Assessment  Cervical Assessment Cervical Assessment: Within Functional Limits Thoracic Assessment Thoracic Assessment: Within Functional Limits Lumbar Assessment Lumbar Assessment:  (posterior pelvic tilt)  Balance Static Sitting Balance Static Sitting - Level of Assistance: 7: Independent Static Standing Balance Static Standing - Level of Assistance: 5: Stand by assistance Dynamic Standing Balance Dynamic Standing - Level of Assistance: 5: Stand by assistance (with UE support in self care activities) Extremity/Trunk Assessment RUE Assessment RUE Assessment: Exceptions to [facility] RUE Strength RUE Overall Strength: Other (Comment) (3+/5 in shoulders, 4-5 in elbow) LUE Assessment LUE Assessment: Exceptions to [facility] LUE Strength LUE Overall Strength: Other (Comment) LUE Overall Strength Comments: 3+/5 shoulder, 4-/5 elbow, 4/5 grip  See FIM for current functional status  Job Holtsclaw 08/02/2012, 12:19 PM

## 2012-08-02 NOTE — Progress Notes (Signed)
Physical Therapy Discharge Summary  Patient Details  Name: ADALIZ DOBIS MRN: [id number] Date of Birth: [birth year]  Today's Date: 08/02/2012 Time: 0900-1000 and  1330-1400 Time Calculation (min): 60 min and 30 min  Patient has met 5 of 9 long term goals due to improved activity tolerance, improved balance, increased strength, ability to compensate for deficits, functional use of  right lower extremity and left lower extremity, improved attention and improved awareness.    Patient to discharge at an ambulatory level Supervision; w/c level modified independent household.   Patient's care partner is independent to provide the necessary cognitive assistance at discharge.  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  Recommendation:  Patient will benefit from ongoing skilled PT services in outpatient setting to continue to advance safe functional mobility, address ongoing impairments in motor control, balance, activity tolerance, pain, cognition, and minimize fall risk.  Equipment: R hinged Neoprene knee brace; L custom AFO; bariatric RW, and rental w/c with Vonna Kotyk Basic cushion  Reasons for discharge: discharge from hospital  Patient/family agrees with progress made and goals achieved: Yes  PT Discharge Precautions/Restrictions Precautions Precautions: Fall Precaution Comments: LEs buckle, L hemianopsia and central visual deficits. ; R knee pain increasing with wt bearing Restrictions Weight Bearing Restrictions: No   Pain Pain Assessment Pain Assessment: 0-10 Pain Score:   4 Pain Type: Chronic pain Pain Location: Knee Pain Orientation: Right Pain Descriptors / Indicators: Aching Pain Frequency: Constant Pain Onset: With Activity Patients Stated Pain Goal: 5 Pain Intervention(s): Medication (See  eMAR) Multiple Pain Sites: No Vision/Perception- loss of central vision  L eye; light sensitivity appears resolved; depth perception improved    Cognition Memory: Impaired Memory Impairment: Decreased recall of new information;Retrieval deficit;Decreased short term memory Sensation Sensation Light Touch: Impaired by gross assessment (LLE) Proprioception: Impaired by gross assessment (LLE) Coordination Fine Motor Movements are Fluid and Coordinated: No Coordination and Movement Description: Decreased accuracy and speed in UEs and LEs Motor  Motor Motor: Hemiplegia;Motor impersistence Motor - Discharge Observations: Inconsistent motor control LLE.  Pt's performance declines with her attempts to think about motor plan.  Mobility Bed Mobility Bed Mobility:  (modified independent for all) Transfers Sit to Stand: 5: Supervision Sit to Stand Details: Verbal cues for technique;Verbal cues for safe use of DME/AE Stand to Sit: 5: Supervision Stand to Sit Details (indicate cue type and reason): Verbal cues for safe use of DME/AE;Verbal cues for technique Locomotion  Ambulation Ambulation: Yes Ambulation/Gait Assistance: 5: Supervision Ambulation Distance (Feet): 45 Feet Assistive device: Rolling walker Ambulation/Gait Assistance Details: Verbal cues for technique;Verbal cues for safe use of DME/AE Ambulation/Gait Assistance Details: pt's performance declines with excessive cueing Gait Gait: Yes Gait Pattern: Impaired Gait Pattern: Decreased step length - left;Decreased hip/knee flexion - left;Decreased hip/knee flexion - right;Decreased dorsiflexion - left;Left genu recurvatum;Antalgic;Narrow base of support;Left flexed knee in stance Gait velocity: extremely slow; not timed Stairs / Additional Locomotion Stairs: No (not at d/c due to increasing R knee pain) Stairs Assistance: 3: Mod assist (earlier in rehab stay) Stairs Assistance Details: Manual facilitation for weight  shifting;Manual facilitation for placement;Verbal cues for safe use of DME/AE;Verbal cues for precautions/safety;Verbal cues for technique;Verbal cues for sequencing Stair Management Technique: Two rails Number of Stairs: 3 Ramp: 4: Min assist Wheelchair Mobility Wheelchair Mobility: Yes Wheelchair Assistance: 6: Modified independent (Device/Increase time)  Wheelchair Propulsion: Both upper extremities Wheelchair Parts Management: Supervision/cueing Distance: 180  Trunk/Postural Assessment  Cervical Assessment Cervical Assessment: Within Functional Limits Thoracic Assessment Thoracic Assessment: Within Functional Limits Lumbar Assessment Lumbar Assessment: Within Functional Limits Postural Control Postural Control: Within Functional Limits  Balance Static Sitting Balance Static Sitting - Level of Assistance: 7: Independent Static Standing Balance Static Standing - Level of Assistance: 5: Stand by assistance Dynamic Standing Balance Dynamic Standing - Level of Assistance: 5: Stand by assistance (with UE support in self care activities) Extremity Assessment      RLE Assessment RLE Assessment: Exceptions to [facility] RLE Strength RLE Overall Strength: Within Functional Limits for tasks assessed;Due to pain RLE Overall Strength Comments: not tested at d/c; pt has had increasing r knee pain during rehab stay; tender to palpation patella LLE Assessment LLE Assessment: Exceptions to [facility] LLE Strength LLE Overall Strength Comments: not formally tested at d/c; grossly > 4-/5; L knee instability results in very rapid buckling/extension when pt is fatigued  See FIM for current functional status  Treatment today: family ed with husband and daughter for basic and simulated car transfer, training in donning and wearing schedule for L AFO, R knee; ambulation, w/c parts management, RW and w/c folding, safety and impulsivity during mobility, w/c management on home ramp.  [name] delivered L AFO  and will attach a L toe cap to pt's shoe to decrease L foot drag.  Family has completed ramp at home so that pt does not have to negotiate 8 STE.  Neliah Cuyler 08/02/2012, 4:36 PM

## 2012-08-02 NOTE — Progress Notes (Signed)
Patient ID: Olivia Payne, female   DOB: 15-Oct-1970, 42 y.o.   MRN: [id number] Subjective/Complaints:  Left knee gives out. Right knee still hurts. Had Right knee injection Friday.  Gives hx of R knee trauma A 12 point review of systems has been performed and if not noted above is otherwise negative.   Objective: Vital Signs: Blood pressure 120/81, pulse 78, temperature 97.4 F (36.3 C), temperature source Oral, resp. rate 20, weight 146.7 kg (323 lb 6.6 oz), last menstrual period 07/07/1992, SpO2 98.00%. No results found. No results found for this basename: WBC, HGB, HCT, PLT,  in the last 72 hours No results found for this basename: NA, K, CL, CO, GLUCOSE, BUN, CREATININE, CALCIUM,  in the last 72 hours CBG (last 3)   Recent Labs  08/01/12 1631 08/01/12 2037 08/02/12 0721  GLUCAP 110* 241* 122*    Wt Readings from Last 3 Encounters:  07/21/12 146.7 kg (323 lb 6.6 oz)  07/11/12 172.6 kg (380 lb 8.2 oz)  07/11/12 172.6 kg (380 lb 8.2 oz)    Physical Exam:  Morbidly obese.  HENT: mild thrush on tongue still Head: Normocephalic and atraumatic.  Eyes: EOM are normal. Pupils are equal, round, and reactive to light.  Neck: No tracheal deviation present. No thyromegaly present.  Cardiovascular: Normal rate and regular rhythm.  Pulmonary/Chest: Effort normal and breath sounds normal.  Abdominal: Soft. Bowel sounds are normal. She exhibits no distension. There is no tenderness.  Musculoskeletal: She exhibits edema.  Valgus and ER of right knee, tender to touch with mild effusion.  Lymphadenopathy:  She has no cervical adenopathy.  Neurological: just awakening. A little slow to arouse but answers all my questions May have impaired vision in central fields.Impaired judgement with memory deficits noted. Improved sitting balance. Still tangential and distractible. Strength 4/5 RUE and 4- LUE. LE movement still inhibited by knee pain ,right more than left. DTR's 1+, sensation grossly  intact in all 4.  Skin:  Right scalp incision clean and well approximated with suture. Mild scab, dried blood around incision  Assessment/Plan: 1. Functional deficits secondary to AVM s/p repair and eventual cranioplasty with ongoing pain,gait,cognitive issues which require 3+ hours per day of interdisciplinary therapy in a comprehensive inpatient rehab setting. Physiatrist is providing close team supervision and 24 hour management of active medical problems listed below. Physiatrist and rehab team continue to assess barriers to discharge/monitor patient progress toward functional and medical goals.    FIM: FIM - Bathing Bathing Steps Patient Completed: Chest;Right Arm;Left Arm;Abdomen;Front perineal area;Buttocks;Right upper leg;Left upper leg;Right lower leg (including foot);Left lower leg (including foot) Bathing: 5: Supervision: Safety issues/verbal cues  FIM - Upper Body Dressing/Undressing Upper body dressing/undressing steps patient completed: Thread/unthread right bra strap;Thread/unthread left bra strap;Hook/unhook bra;Thread/unthread right sleeve of pullover shirt/dresss;Thread/unthread left sleeve of pullover shirt/dress;Put head through opening of pull over shirt/dress;Pull shirt over trunk Upper body dressing/undressing: 6: More than reasonable amount of time FIM - Lower Body Dressing/Undressing Lower body dressing/undressing steps patient completed: Thread/unthread right underwear leg;Thread/unthread left underwear leg;Pull underwear up/down;Thread/unthread right pants leg;Thread/unthread left pants leg;Pull pants up/down;Don/Doff right sock;Don/Doff left sock Lower body dressing/undressing: 5: Supervision: Safety issues/verbal cues  FIM - Toileting Toileting steps completed by patient: Adjust clothing prior to toileting;Performs perineal hygiene;Adjust clothing after toileting Toileting Assistive Devices: Grab bar or rail for support Toileting: 5: Supervision: Safety  issues/verbal cues  FIM - Diplomatic Services operational officer Devices: Grab bars Toilet Transfers: 5-To toilet/BSC: Supervision (verbal cues/safety issues);5-From toilet/BSC: Supervision (verbal  cues/safety issues)  FIM - Banker Devices: Arm rests Bed/Chair Transfer: 7: Supine > Sit: No assist;5: Bed > Chair or W/C: Supervision (verbal cues/safety issues)  FIM - Locomotion: Wheelchair Distance: 180 Locomotion: Wheelchair: 6: Travels 150 ft or more, turns around, maneuvers to table, bed or toilet, negotiates 3% grade: maneuvers on rugs and over door sills independently FIM - Locomotion: Ambulation Locomotion: Ambulation Assistive Devices: Designer, industrial/product Ambulation/Gait Assistance: 4: Min assist Locomotion: Ambulation: 1: Travels less than 50 ft with minimal assistance (Pt.>75%)  Comprehension Comprehension Mode: Auditory Comprehension: 5-Follows basic conversation/direction: With extra time/assistive device  Expression Expression Mode: Verbal Expression: 5-Expresses basic needs/ideas: With no assist  Social Interaction Social Interaction: 6-Interacts appropriately with others with medication or extra time (anti-anxiety, antidepressant).  Problem Solving Problem Solving: 5-Solves basic problems: With no assist  Memory Memory: 6-More than reasonable amt of time  Medical Problem List and Plan:  1. DVT Prophylaxis/Anticoagulation: Pharmaceutical: Lovenox.  2. Facial pain/chronic back pain/chronic right knee pain/ headaches: Will continue topamax and lyrica for facial pain/headache. .  Voltaren gel to help with knee pain. Ok to use icy hot patches to back daily.   - toradol prn  -overall pain/headaches are much improved  -orthotist to work with PT regarding a potential brace to help stabilize the left knee---might also benefit from right knee neoprene sleeve 3. Anxiety/Depression: continue cymbalta. Will need a lot of ego support  by team. Seems anxious and distracted by multiple somatic complaints and poor insight/awareness.  4. Neuropsych: This patient is capable of making decisions on her own behalf.  5. DM type 2: Will monitor with AC/HS cbg checks. Increased metformin to 1000mg  xr  -improving control-  -steroid taper to off 6. HTN: Will monitor with bid checks.  7. Morbid obesity:   educate patient regarding weight loss/ diet. Routine pressure relief measures 8.  R knee pain, I suspect meniscal tear and discussed MRI but this couldn't be done secondary to aneurysm clips.  Will need to f/u with Dr Penni Bombard at [facility] ortho, he may need to do a knee ultrasound 9. Thrush-   - D/C nystatin  LOS (Days) 17 A FACE TO FACE EVALUATION WAS PERFORMED  Erick Colace 08/02/2012 8:24 AM

## 2012-08-02 NOTE — Progress Notes (Signed)
Social Work Patient ID: Olivia Payne, female   DOB: [birth year], 42 y.o.   MRN: [id number] Spoke with Creola Corn case Manager who requested a update.  Faxed update to her and made aware discharge tomorrow

## 2012-08-02 NOTE — Progress Notes (Signed)
Social Work Patient ID: Olivia Payne, female   DOB: 08-Oct-1970, 42 y.o.   MRN: [id number] Met with pt,husband and daughter to discuss discharge needs and answer questions regarding discharge tomorrow. Pt aware and agreeable to going to [facility] for OP so can get all three therapies.  Faxed prescription and to contact husband regarding first  Appointment.  Ramp built over the weekend.  Pt and husband to get tub seat on own has measurements.  Husband and daughter going to therapies with pt today And learning her care.  Pt's rental wheelchair coming tomorrow to room.  Pt to have 24 hour care at discharge.  Aware insurance will continue to monitor and OP will Be updating them like we have here.  Pt feels better about discharge and follow up and will work with her insurance on coverage. Her main concern is getting the number of visits She needs to improve.  Set for discharge tomorrow.

## 2012-08-03 DIAGNOSIS — Q048 Other specified congenital malformations of brain: Secondary | ICD-10-CM

## 2012-08-03 DIAGNOSIS — R269 Unspecified abnormalities of gait and mobility: Secondary | ICD-10-CM

## 2012-08-03 LAB — GLUCOSE, CAPILLARY: Glucose-Capillary: 133 mg/dL — ABNORMAL HIGH (ref 70–99)

## 2012-08-03 MED ORDER — DICLOFENAC SODIUM 1 % TD GEL: 2.0000 g | Freq: Four times a day (QID) | TRANSDERMAL | Status: DC

## 2012-08-03 MED ORDER — LEVETIRACETAM 500 MG PO TABS: 500.0000 mg | ORAL_TABLET | Freq: Two times a day (BID) | ORAL | Status: DC

## 2012-08-03 MED ORDER — LEVOTHYROXINE SODIUM 50 MCG PO TABS: 50.0000 ug | ORAL_TABLET | Freq: Every day | ORAL | Status: DC

## 2012-08-03 MED ORDER — PREGABALIN 75 MG PO CAPS: 150.0000 mg | ORAL_CAPSULE | Freq: Every day | ORAL | Status: DC | PRN

## 2012-08-03 MED ORDER — METFORMIN HCL ER (OSM) 1000 MG PO TB24: 1000.0000 mg | ORAL_TABLET | Freq: Every day | ORAL | Status: DC

## 2012-08-03 MED ORDER — TRAZODONE HCL 50 MG PO TABS: 100.0000 mg | ORAL_TABLET | Freq: Every day | ORAL | Status: DC

## 2012-08-03 MED ORDER — OXYCODONE HCL 5 MG PO TABS: 5.0000 mg | ORAL_TABLET | ORAL | Status: DC | PRN

## 2012-08-03 MED ORDER — ESCITALOPRAM OXALATE 10 MG PO TABS: 10.0000 mg | ORAL_TABLET | Freq: Every day | ORAL | Status: DC

## 2012-08-03 MED ORDER — TOPIRAMATE 50 MG PO TABS: 50.0000 mg | ORAL_TABLET | Freq: Every day | ORAL | Status: DC | PRN

## 2012-08-03 MED ORDER — DULOXETINE HCL 30 MG PO CPEP: 60.0000 mg | ORAL_CAPSULE | Freq: Every day | ORAL | Status: DC

## 2012-08-03 MED ORDER — PANTOPRAZOLE SODIUM 40 MG PO TBEC: 40.0000 mg | DELAYED_RELEASE_TABLET | Freq: Every day | ORAL | Status: DC

## 2012-08-03 MED ORDER — METHOCARBAMOL 750 MG PO TABS: 750.0000 mg | ORAL_TABLET | Freq: Four times a day (QID) | ORAL | Status: DC | PRN

## 2012-08-03 MED ORDER — LORAZEPAM 1 MG PO TABS: 2.0000 mg | ORAL_TABLET | Freq: Two times a day (BID) | ORAL | Status: DC

## 2012-08-03 NOTE — Progress Notes (Signed)
Social Work Discharge Note Discharge Note  The overall goal for the admission was met for:   Discharge location: Yes-HOME WITH HUSBAND WHO WILL BE PROVIDING 24 HR CARE  Length of Stay: Yes-18 DAYS  Discharge activity level: Yes-SUPERVISION/MIN LEVEL  Home/community participation: Yes  Services provided included: MD, RD, PT, OT, SLP, RN, TR, Pharmacy, Neuropsych and SW  Financial Services: Private Insurance: [employer]  Follow-up services arranged: Outpatient: [facility]-PT,OT,SPT CALL HUSBAND TO SET UP APPT and DME: [facility]-WIDE ROLLING WALKER, WHEELCHAIR, GET TUB SEAT ON OWN  Comments (or additional information):FAILY EDUCATION COMPLETED YESTERDAY-HUSBAND AND DAUGHTER HERE.  ALL COMFORTABLE WITH HER CARE NEEDS.  AWARE [facility] TO CALL HUSBAND TO SET UP  OP APPT.  TO GET TUB SEAT ON OWN DUE TO MEASUREMENTS OF PT'S TUB.  TO FOLLOW UP WITH -NEURO-PSYCH AS OP.  Patient/Family verbalized understanding of follow-up arrangements: Yes  Individual responsible for coordination of the follow-up plan: BUCK-HUSBAND  Confirmed correct DME delivered: Lucy Chris 08/03/2012    Lucy Chris

## 2012-08-03 NOTE — Progress Notes (Signed)
Patient ID: Olivia Payne, female   DOB: 1971-01-31, 42 y.o.   MRN: [id number] Subjective/Complaints:   Right knee still hurts. Had Right knee injection Friday.  Gives hx of R knee trauma A 12 point review of systems has been performed and if not noted above is otherwise negative.   Objective: Vital Signs: Blood pressure 122/83, pulse 104, temperature 97.5 F (36.4 C), temperature source Oral, resp. rate 18, weight 146.7 kg (323 lb 6.6 oz), last menstrual period 07/07/1992, SpO2 96.00%. No results found. No results found for this basename: WBC, HGB, HCT, PLT,  in the last 72 hours No results found for this basename: NA, K, CL, CO, GLUCOSE, BUN, CREATININE, CALCIUM,  in the last 72 hours CBG (last 3)   Recent Labs  08/02/12 1617 08/02/12 2107 08/03/12 0745  GLUCAP 88 150* 133*    Wt Readings from Last 3 Encounters:  07/21/12 146.7 kg (323 lb 6.6 oz)  07/11/12 172.6 kg (380 lb 8.2 oz)  07/11/12 172.6 kg (380 lb 8.2 oz)    Physical Exam:  Morbidly obese.  HENT: mild thrush on tongue still Head: Normocephalic and atraumatic.  Eyes: EOM are normal. Pupils are equal, round, and reactive to light.  Neck: No tracheal deviation present. No thyromegaly present.  Cardiovascular: Normal rate and regular rhythm.  Pulmonary/Chest: Effort normal and breath sounds normal.  Abdominal: Soft. Bowel sounds are normal. She exhibits no distension. There is no tenderness.  Musculoskeletal: She exhibits edema.  Valgus and ER of right knee, tender to touch with mild effusion.  Lymphadenopathy:  She has no cervical adenopathy.  Neurological: just awakening. A little slow to arouse but answers all my questions May have impaired vision in central fields.Impaired judgement with memory deficits noted. Improved sitting balance. Still tangential and distractible. Strength 4/5 RUE and 4- LUE. LE movement still inhibited by knee pain ,right more than left. DTR's 1+, sensation grossly intact in all 4.   Skin:  Right scalp incision clean and well approximated with suture. Mild scab, dried blood around incision  Assessment/Plan: 1. Functional deficits secondary to AVM s/p repair and eventual cranioplasty  Stable for D/C today F/u PCP in 1-2 weeks F/u NS 2 wks F/u PM&R 3 weeks See D/C summary See D/C instructions   FIM: FIM - Bathing Bathing Steps Patient Completed: Chest;Right Arm;Left Arm;Abdomen;Front perineal area;Buttocks;Right upper leg;Left upper leg;Right lower leg (including foot);Left lower leg (including foot) Bathing: 6: More than reasonable amount of time  FIM - Upper Body Dressing/Undressing Upper body dressing/undressing steps patient completed: Thread/unthread right bra strap;Thread/unthread left bra strap;Hook/unhook bra;Thread/unthread right sleeve of pullover shirt/dresss;Thread/unthread left sleeve of pullover shirt/dress;Put head through opening of pull over shirt/dress;Pull shirt over trunk Upper body dressing/undressing: 6: More than reasonable amount of time FIM - Lower Body Dressing/Undressing Lower body dressing/undressing steps patient completed: Thread/unthread right underwear leg;Thread/unthread left underwear leg;Pull underwear up/down;Thread/unthread right pants leg;Thread/unthread left pants leg;Pull pants up/down;Don/Doff right sock;Don/Doff left sock Lower body dressing/undressing: 5: Supervision: Safety issues/verbal cues  FIM - Toileting Toileting steps completed by patient: Adjust clothing prior to toileting;Performs perineal hygiene;Adjust clothing after toileting Toileting Assistive Devices: Grab bar or rail for support Toileting: 6: More than reasonable amount of time  FIM - Diplomatic Services operational officer Devices: Grab bars;Bedside commode (from W/C to toilet only, not with RW) Toilet Transfers: 6-More than reasonable amt of time  FIM - Banker Devices: Arm rests Bed/Chair Transfer: 5:  Chair or W/C > Bed: Supervision (verbal cues/safety  issues);5: Bed > Chair or W/C: Supervision (verbal cues/safety issues)  FIM - Locomotion: Wheelchair Distance: 180 Locomotion: Wheelchair: 6: Travels 150 ft or more, turns around, maneuvers to table, bed or toilet, negotiates 3% grade: maneuvers on rugs and over door sills independently FIM - Locomotion: Ambulation Locomotion: Ambulation Assistive Devices: Walker - Rolling;Orthosis (R knee brace; L AFO) Ambulation/Gait Assistance: 5: Supervision Locomotion: Ambulation: 1: Travels less than 50 ft with supervision/safety issues  Comprehension Comprehension Mode: Auditory Comprehension: 5-Follows basic conversation/direction: With extra time/assistive device  Expression Expression Mode: Verbal Expression: 5-Expresses basic needs/ideas: With no assist  Social Interaction Social Interaction: 6-Interacts appropriately with others with medication or extra time (anti-anxiety, antidepressant).  Problem Solving Problem Solving: 5-Solves basic problems: With no assist  Memory Memory: 6-More than reasonable amt of time  Medical Problem List and Plan:  1. DVT Prophylaxis/Anticoagulation: Pharmaceutical: Lovenox.  2. Facial pain/chronic back pain/chronic right knee pain/ headaches: Will continue topamax and lyrica for facial pain/headache. .  Voltaren gel to help with knee pain. Ok to use icy hot patches to back daily.   - toradol prn  -overall pain/headaches are much improved  -orthotist to work with PT regarding a potential brace to help stabilize the left knee---might also benefit from right knee neoprene sleeve 3. Anxiety/Depression: continue cymbalta. Will need a lot of ego support by team. Seems anxious and distracted by multiple somatic complaints and poor insight/awareness.  4. Neuropsych: This patient is capable of making decisions on her own behalf.  5. DM type 2: Will monitor with AC/HS cbg checks. Increased metformin to 1000mg   xr  -improving control-  -steroid taper to off 6. HTN: Will monitor with bid checks.  7. Morbid obesity:   educate patient regarding weight loss/ diet. Routine pressure relief measures 8.  R knee pain, I suspect meniscal tear and discussed MRI but this couldn't be done secondary to aneurysm clips.Pt states Dr Franky Macho told her she can have MRIs but she'll need to clarify during outpt f/u  Will need to f/u with Dr Penni Bombard at [facility] ortho, he may need to do a knee ultrasound 9. Thrush-   - D/C nystatin  LOS (Days) 18 A FACE TO FACE EVALUATION WAS PERFORMED  Erick Colace 08/03/2012 7:55 AM

## 2012-08-03 NOTE — Progress Notes (Signed)
Recreational Therapy Discharge Summary Patient Details  Name: Olivia Payne MRN: [id number] Date of Birth: December 20, 1970 Today's Date: 08/03/2012  Long term goals set: 1  Long term goals met: 1  Comments on progress toward goals: Pt met supervision level for simple TR tasks seated (requires intermittent set up assist) and min assist for standing with min- mod cues for impulsivity and/or anxiety.  Pt is being discharged home today with family. Reasons for discharge: discharge from hospital  Patient/family agrees with progress made and goals achieved: Yes  [name] 08/03/2012, 4:46 PM

## 2012-08-03 NOTE — Progress Notes (Signed)
Speech Language Pathology Discharge Summary  Patient Details  Name: Olivia Payne MRN: [id number] Date of Birth: [birth year]  Today's Date: 08/03/2012  Patient has met 4 of 5 long term goals.  Patient to discharge at overall Supervision level.  Reasons goals not met: needs assist to identify and fix financial managemet errors   Clinical Impression/Discharge Summary: Patient met 4 out of 5 long term goals during CIR stay due to gains in cognition, especially in the areas of selective attention, recall with regards to working memory strategies as well as use of external aids to assist with recall of daily information and use of word finding strategies.  Patient also made gains in complex problem solving; however, she continues to require significant cues due to decreased self-monitoring and correcting.  As a result, it is recommended that she have 24/7 Supervision and continues to receive skilled SLP services to address these deficits, maximize functional independence and reduce burden of care.     Care Partner:  Caregiver Able to Provide Assistance: Yes  Type of Caregiver Assistance: Physical;Cognitive  Recommendation:  Outpatient SLP;24 hour supervision/assistance  Rationale for SLP Follow Up: Maximize cognitive function and independence;Reduce caregiver burden   Equipment: none   Reasons for discharge: Treatment goals met;Discharged from hospital   Patient/Family Agrees with Progress Made and Goals Achieved: Yes   See FIM for current functional status  [name]., CCC-SLP [phone]  Olivia Payne 08/03/2012, 4:25 PM

## 2012-08-03 NOTE — Progress Notes (Signed)
Pt discharged home with husband, discharge instructions provided by Dan Finland PA, pt and husband both verbalized an understanding and denied any questions or concerns, pt discharged via wheelchair to private vehicle

## 2012-08-04 NOTE — Discharge Summary (Signed)
Physician Discharge Summary  Patient ID: Olivia Payne MRN: [id number] DOB/AGE: Jul 17, 1970 42 y.o.  Admit date: 07/16/2012 Discharge date: 08/03/12  Discharge Diagnoses:  Principal Problem:    Cranioplasty past AVM (arteriovenous malformation) brain with  repair  Active Problems:   Anxiety state, unspecified   Type II or unspecified type diabetes mellitus with ophthalmic manifestations, not stated as uncontrolled(250.50)   Vitamin D deficiency   Morbid obesity   Thrush, oral   HTN (hypertension)   Depressive disorder, not elsewhere classified   Right Knee contusion.    Chronic pain-facial, right knee,back   Headaches   Discharged Condition: Good.   Significant Diagnostic Studies: Dg Knee 1-2 Views Right  07/19/2012   *RADIOLOGY REPORT*  Clinical Data: Right knee pain with popping  RIGHT KNEE - 1-2 VIEW  Comparison: None.  Findings: There may be only slightly decreased joint space laterally.  No fracture is seen.  No effusion is noted.  IMPRESSION: No acute abnormality.  Perhaps slight decrease and lateral joint space.   Original Report Authenticated By: Dwyane Dee, M.D.     Labs:  Basic Metabolic Panel: No results found for this basename: NA, K, CL, CO2, GLUCOSE, BUN, CREATININE, CALCIUM, MG, PHOS,  in the last 168 hours  CBC: No results found for this basename: WBC, NEUTROABS, HGB, HCT, MCV, PLT,  in the last 168 hours  CBG:  Recent Labs Lab 08/02/12 0721 08/02/12 1116 08/02/12 1617 08/02/12 2107 08/03/12 0745  GLUCAP 122* 140* 88 150* 133*    Brief HPI:   Olivia Payne is a 42 y.o. female with history of DM with peripheral neuropathy, HTN, headaches and left visual deficits with workup revealing ruptured intraocular aneurysm and posterotemporal AVM treated with cerebral embolization and craniotomy with resection and placement of bone flap in abdomen on 05/26/12. She was admitted on 07/09/12 for cranioplasty by Dr Franky Macho. PT/OT evaluations done and patient with  moderate weakness, decreased balance and activity tolerance. PT, OT, MD recommending CIR and patient was admitted for progressive therapies.    Hospital Course: Olivia Payne was admitted to rehab 07/16/2012 for inpatient therapies to consist of PT, ST and OT at least three hours five days a week. Past admission physiatrist, therapy team and rehab RN have worked together to provide customized collaborative inpatient rehab.  Her blood pressures have been well controlled.steroids were tapered off but BS continued to be poorly controlled.  Therefore metformin was increased to 1000mg . Thrush due to steroids treated with po diflucan.  She has has complaints of right knee pain and xrays done without evidence of injury or OA. Patient with history of recent fall with contusion. Knee was injected with kenalog on 05/30. She has made good progress however has been limited due to right knee pain. She will continue with  Outpatient PT, OT and ST at [facility] outpatient rehab.   Rehab course: During patient's stay in rehab weekly team conferences were held to monitor patient's progress, set goals and discuss barriers to discharge. Speech therapy-addressing cognitive-linguistic goals. She has made   gains in cognition, especially in the areas of selective attention and recall with regards to working memory strategies as well as use of external aids to assist with recall of daily information and use of word finding strategies. Patient also made gains in complex problem solving; however, she continues to require significant cues due to decreased self-monitoring and correcting therefore 24 hours supervision recommended past discharge.   Occupational therapy has focused on neuromuscular reeducation of  RUE as well as ADL tasks and endurance. Pt has been very inconsistent with her motor performance. She often has severe weakness in LEs with ataxic motor pattern. She is had supervision level for transfers and dressing. She is  modified independent for bathing, toileting and toileting transfers from wheelchair level. She has had improved activity tolerance, improved balance, postural control, ability to compensate for deficits, functional use of bilateral lower extremities as well as improved awareness. Left AFO with left toe cap was ordered to help decrease left foot drag. Progress toward long term goals were limited by pt's impulsivity, anxiety, morbid obesity and R knee pain. She is independent at wheelchair level. She requires supervision for transfers and ambulation of short distances. Family education was done with husband regarding assistance needed for mobility as well as necessary cognitive assistance for safety.     Disposition: 01-Home or Self Care   Diet: Diabetic.    Special Instructions: 1. No lifting, driving, or strenuous exercise for till cleared by MD. 2. Followup Dr. Penni Bombard in relation to right knee pain question. MRI must be cleared first by neurosurgery 3. [facility]: PT, OT, ST --Husband to call for first appointment date. Rx faxed.  4. Wash wound with soap and water, pat dry. Keflex to be called in to Pharmacy for superficial abdominal wound infection.    Future Appointments Provider Department Dept Phone   08/17/2012 11:15 AM Erick Colace, MD [facility] [phone]       Medication List    STOP taking these medications       lisinopril 10 MG tablet  Commonly known as:  PRINIVIL,ZESTRIL     traMADol 50 MG tablet  Commonly known as:  ULTRAM      TAKE these medications       albuterol 108 (90 BASE) MCG/ACT inhaler  Commonly known as:  PROVENTIL HFA;VENTOLIN HFA  Inhale 2 puffs into the lungs every 6 (six) hours as needed for wheezing.     diclofenac sodium 1 % Gel  Commonly known as:  VOLTAREN  Apply 2 g topically 4 (four) times daily.     DULoxetine 30 MG capsule  Commonly known as:  CYMBALTA  Take 2 capsules (60 mg total) by mouth  daily.     escitalopram 10 MG tablet  Commonly known as:  LEXAPRO  Take 1 tablet (10 mg total) by mouth daily.     levETIRAcetam 500 MG tablet  Commonly known as:  KEPPRA  Take 1 tablet (500 mg total) by mouth 2 (two) times daily.     levothyroxine 50 MCG tablet  Commonly known as:  SYNTHROID, LEVOTHROID  Take 1 tablet (50 mcg total) by mouth daily.     LORazepam 1 MG tablet  Commonly known as:  ATIVAN  Take 2 tablets (2 mg total) by mouth 2 (two) times daily.     medroxyPROGESTERone 150 MG/ML injection  Commonly known as:  DEPO-PROVERA  Inject 150 mg into the muscle every 3 (three) months. Last dose administered 01/28/2012.     metformin 1000 MG (OSM) 24 hr tablet  Commonly known as:  FORTAMET  Take 1 tablet (1,000 mg total) by mouth daily with breakfast.     methocarbamol 750 MG tablet  Commonly known as:  ROBAXIN  Take 1 tablet (750 mg total) by mouth every 6 (six) hours as needed.     oxyCODONE 5 MG immediate release tablet  Commonly known as:  Oxy IR/ROXICODONE  Take 1-2 tablets (  5-10 mg total) by mouth every 4 (four) hours as needed.     pantoprazole 40 MG tablet  Commonly known as:  PROTONIX  Take 1 tablet (40 mg total) by mouth daily.     pregabalin 75 MG capsule  Commonly known as:  LYRICA  Take 2 capsules (150 mg total) by mouth daily as needed. For nerve pain     topiramate 50 MG tablet  Commonly known as:  TOPAMAX  Take 1 tablet (50 mg total) by mouth daily as needed. For migraines     traZODone 50 MG tablet  Commonly known as:  DESYREL  Take 2 tablets (100 mg total) by mouth at bedtime. For sleep           Follow-up Information   Follow up with Erick Colace, MD On 08/17/2012. (Be there at  11:00 am  for 11:15 appointment)    Contact information:   [address] [place] [address] [phone]       Follow up with CABBELL,KYLE L, MD. Call today. (for follow up )    Contact information:   [address]                          [address] [place] [address] [phone]       Follow up with Donzetta Sprung, MD On 08/16/2012. (APPT 4:00 PM)    Contact information:   [address] [place] [address] [phone]       Signed: [name] 08/04/2012, 9:07 AM

## 2012-08-04 NOTE — Progress Notes (Signed)
Patient with evidence of erythema along abominal incision line per reports. Will call in Rx for keflex 250 mg po tid X 1 week. Patient relayed that she gets yeast infections with antibiotics. Rx for diflucan 150 mg every 48 hours X 2 doses called in to [facility].

## 2012-08-09 DIAGNOSIS — Q283 Other malformations of cerebral vessels: Secondary | ICD-10-CM | POA: Insufficient documentation

## 2012-08-17 ENCOUNTER — Inpatient Hospital Stay: Payer: Managed Care, Other (non HMO) | Admitting: Physical Medicine & Rehabilitation

## 2012-08-26 ENCOUNTER — Telehealth: Payer: Self-pay

## 2012-08-26 NOTE — Telephone Encounter (Signed)
Keppra refill request.  Please advise.

## 2012-08-26 NOTE — Telephone Encounter (Signed)
Needs to get from Dr. Franky Macho

## 2013-01-12 HISTORY — PX: EYE SURGERY: SHX253

## 2013-02-21 ENCOUNTER — Telehealth (HOSPITAL_COMMUNITY): Payer: Self-pay | Admitting: Interventional Radiology

## 2013-02-21 ENCOUNTER — Other Ambulatory Visit (HOSPITAL_COMMUNITY): Payer: Self-pay | Admitting: Interventional Radiology

## 2013-02-21 ENCOUNTER — Other Ambulatory Visit: Payer: Self-pay | Admitting: Radiology

## 2013-02-21 DIAGNOSIS — I729 Aneurysm of unspecified site: Secondary | ICD-10-CM

## 2013-02-21 DIAGNOSIS — Q2730 Arteriovenous malformation, site unspecified: Secondary | ICD-10-CM

## 2013-02-21 NOTE — Telephone Encounter (Signed)
Spoke to pt's husband about scheduling pt's f/u angio - he will have her to call me back to set up JM

## 2013-02-23 ENCOUNTER — Encounter (HOSPITAL_COMMUNITY): Payer: Self-pay

## 2013-02-23 ENCOUNTER — Other Ambulatory Visit (HOSPITAL_COMMUNITY): Payer: Self-pay | Admitting: Interventional Radiology

## 2013-02-23 ENCOUNTER — Ambulatory Visit (HOSPITAL_COMMUNITY)
Admission: RE | Admit: 2013-02-23 | Discharge: 2013-02-23 | Disposition: A | Discharge status: Home / Self Care | Payer: Managed Care, Other (non HMO) | Source: Ambulatory Visit | Attending: Interventional Radiology | Admitting: Interventional Radiology

## 2013-02-23 DIAGNOSIS — G4730 Sleep apnea, unspecified: Secondary | ICD-10-CM | POA: Insufficient documentation

## 2013-02-23 DIAGNOSIS — Q2730 Arteriovenous malformation, site unspecified: Secondary | ICD-10-CM

## 2013-02-23 DIAGNOSIS — I10 Essential (primary) hypertension: Secondary | ICD-10-CM | POA: Insufficient documentation

## 2013-02-23 DIAGNOSIS — I729 Aneurysm of unspecified site: Secondary | ICD-10-CM

## 2013-02-23 DIAGNOSIS — F329 Major depressive disorder, single episode, unspecified: Secondary | ICD-10-CM | POA: Insufficient documentation

## 2013-02-23 DIAGNOSIS — Q283 Other malformations of cerebral vessels: Secondary | ICD-10-CM | POA: Insufficient documentation

## 2013-02-23 DIAGNOSIS — IMO0002 Reserved for concepts with insufficient information to code with codable children: Secondary | ICD-10-CM | POA: Insufficient documentation

## 2013-02-23 DIAGNOSIS — Z09 Encounter for follow-up examination after completed treatment for conditions other than malignant neoplasm: Secondary | ICD-10-CM | POA: Insufficient documentation

## 2013-02-23 DIAGNOSIS — E1149 Type 2 diabetes mellitus with other diabetic neurological complication: Secondary | ICD-10-CM | POA: Insufficient documentation

## 2013-02-23 DIAGNOSIS — E1142 Type 2 diabetes mellitus with diabetic polyneuropathy: Secondary | ICD-10-CM | POA: Insufficient documentation

## 2013-02-23 DIAGNOSIS — J45909 Unspecified asthma, uncomplicated: Secondary | ICD-10-CM | POA: Insufficient documentation

## 2013-02-23 DIAGNOSIS — Z9889 Other specified postprocedural states: Secondary | ICD-10-CM | POA: Insufficient documentation

## 2013-02-23 DIAGNOSIS — F411 Generalized anxiety disorder: Secondary | ICD-10-CM | POA: Insufficient documentation

## 2013-02-23 DIAGNOSIS — H5440 Blindness, one eye, unspecified eye: Secondary | ICD-10-CM | POA: Insufficient documentation

## 2013-02-23 DIAGNOSIS — K219 Gastro-esophageal reflux disease without esophagitis: Secondary | ICD-10-CM | POA: Insufficient documentation

## 2013-02-23 DIAGNOSIS — F3289 Other specified depressive episodes: Secondary | ICD-10-CM | POA: Insufficient documentation

## 2013-02-23 DIAGNOSIS — Z87891 Personal history of nicotine dependence: Secondary | ICD-10-CM | POA: Insufficient documentation

## 2013-02-23 DIAGNOSIS — M1710 Unilateral primary osteoarthritis, unspecified knee: Secondary | ICD-10-CM | POA: Insufficient documentation

## 2013-02-23 DIAGNOSIS — E039 Hypothyroidism, unspecified: Secondary | ICD-10-CM | POA: Insufficient documentation

## 2013-02-23 LAB — BASIC METABOLIC PANEL
BUN: 14 mg/dL (ref 6–23)
CO2: 25 mEq/L (ref 19–32)
Chloride: 102 mEq/L (ref 96–112)
Glucose, Bld: 140 mg/dL — ABNORMAL HIGH (ref 70–99)
Potassium: 3.9 mEq/L (ref 3.5–5.1)

## 2013-02-23 LAB — CBC
HCT: 38.2 % (ref 36.0–46.0)
Hemoglobin: 12.3 g/dL (ref 12.0–15.0)
MCH: 24 pg — ABNORMAL LOW (ref 26.0–34.0)
MCHC: 32.2 g/dL (ref 30.0–36.0)

## 2013-02-23 MED ORDER — MIDAZOLAM HCL 2 MG/2ML IJ SOLN
INTRAMUSCULAR | Status: AC | PRN
  Administered 2013-02-23: 1 mg via INTRAVENOUS

## 2013-02-23 MED ORDER — HYDROCODONE-ACETAMINOPHEN 5-325 MG PO TABS
2.0000 | ORAL_TABLET | Freq: Once | ORAL | Status: AC
  Administered 2013-02-23: 2 via ORAL
  Filled 2013-02-23: qty 2

## 2013-02-23 MED ORDER — SODIUM CHLORIDE 0.9 % IV BOLUS (SEPSIS)
INTRAVENOUS | Status: AC | PRN
  Administered 2013-02-23: 250 mL via INTRAVENOUS

## 2013-02-23 MED ORDER — IOHEXOL 300 MG/ML  SOLN
150.0000 mL | Freq: Once | INTRAMUSCULAR | Status: AC | PRN
  Administered 2013-02-23: 75 mL via INTRA_ARTERIAL

## 2013-02-23 MED ORDER — MIDAZOLAM HCL 2 MG/2ML IJ SOLN
INTRAMUSCULAR | Status: AC
  Filled 2013-02-23: qty 2

## 2013-02-23 MED ORDER — HEPARIN SOD (PORK) LOCK FLUSH 100 UNIT/ML IV SOLN
INTRAVENOUS | Status: AC | PRN
  Administered 2013-02-23: 1000 [IU] via INTRAVENOUS

## 2013-02-23 MED ORDER — SODIUM CHLORIDE 0.9 % IV SOLN
INTRAVENOUS | Status: DC
  Administered 2013-02-23: 75 mL/h via INTRAVENOUS

## 2013-02-23 MED ORDER — SODIUM CHLORIDE 0.9 % IV SOLN: INTRAVENOUS | Status: AC

## 2013-02-23 MED ORDER — FENTANYL CITRATE 0.05 MG/ML IJ SOLN
INTRAMUSCULAR | Status: AC
  Filled 2013-02-23: qty 2

## 2013-02-23 MED ORDER — FENTANYL CITRATE 0.05 MG/ML IJ SOLN
INTRAMUSCULAR | Status: AC | PRN
  Administered 2013-02-23 (×3): 25 ug via INTRAVENOUS

## 2013-02-23 NOTE — Procedures (Signed)
S/P 4 vessel cerebral arteriogram. RT CFA approach . Findings. 1No angiographic evidence of  residual Rt cerebral hemishere AVM. 2.No aneurysms seen

## 2013-02-23 NOTE — Progress Notes (Signed)
Pain med effective for back pain.

## 2013-02-23 NOTE — Progress Notes (Signed)
Pt  Discharge instructions given per MD order.  Pt and CG was able to verbalize understanding.  Pt to car via wheelchair.

## 2013-02-23 NOTE — H&P (Signed)
Olivia Payne is an 42 y.o. female.   Chief Complaint: "I'm having another angiogram" HPI: Patient with prior history of  large right posterior temporal lobe AVM embolization followed by resection 05/2012 presents today for follow up cerebral arteriogram.  Past Medical History  Diagnosis Date  . Asthma   . History of chicken pox   . Migraine   . Allergy   . Thyroid disease   . Neuropathy   . Hypertension   . Hypothyroidism   . Anxiety   . Depression   . Cerebral aneurysm   . Cerebral AVM   . Diabetes mellitus with other specified manifestations     peripheral neuropathy  . Sleep apnea     does not use Cpap  . GERD (gastroesophageal reflux disease)     hx  . 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   . OA (osteoarthritis) of knee     left knee with chronic pain  . Facial pain     since intubation    Past Surgical History  Procedure Laterality Date  . Dilation and curettage of uterus  06/07/2002  . Cesarean section  1995  . Cerebral arteriogram    . Radiology with anesthesia N/A 04/20/2012    Procedure: RADIOLOGY WITH ANESTHESIA;  Surgeon: Oneal Grout, MD;  Location: [facility];  Service: Radiology;  Laterality: N/A;  . Radiology with anesthesia N/A 05/26/2012    Procedure: RADIOLOGY WITH ANESTHESIA;  Surgeon: Oneal Grout, MD;  Location: [facility] OR;  Service: Radiology;  Laterality: N/A;  . Craniotomy Right 05/26/2012    Procedure: CRANIECTOMY INTRACRANIAL ARTERIO-VENOUS MALFORMATION DURAL COMPLEX (AVM). PLACEMENT OF BONE FLAB IN ABDOMINAL WALL;  Surgeon: Carmela Hurt, MD;  Location: [facility];  Service: Neurosurgery;  Laterality: Right;  RIGHT Craniectomy for arteriovenous malformation resection  . Eye surgery  01/12/2013    laser   . Cranioplasty Right 07/09/2012    Procedure: CRANIOPLASTY;  Surgeon: Carmela Hurt, MD;  Location: [facility];  Service: Neurosurgery;  Laterality: Right;  Cranioplasty with bone retrieval from  abdominal pocket    Family History  Problem Relation Age of Onset  . Cancer Mother     Breast Cancer  . Diabetes Father   . Heart disease Father   . Heart attack Father 78  . Cancer Paternal Aunt     Ovarian Cancer  . Diabetes Paternal Grandmother   . Cancer Other     Lung Cancer-Maternal side of family   Social History:  reports that she quit smoking about 9 years ago. Her smoking use included Cigarettes. She has a 39 pack-year smoking history. She does not have any smokeless tobacco history on file. She reports that she does not drink alcohol or use illicit drugs.  Allergies:  Allergies  Allergen Reactions  . Cleocin [Clindamycin Hcl] Anaphylaxis  . Latex Other (See Comments)    "SEVERE REACTION"  When received flu shot.    Current outpatient prescriptions:DULoxetine (CYMBALTA) 30 MG capsule, Take 2 capsules (60 mg total) by mouth daily., Disp: 30 capsule, Rfl: 3;  escitalopram (LEXAPRO) 10 MG tablet, Take 1 tablet (10 mg total) by mouth daily., Disp: 30 tablet, Rfl: 1;  levothyroxine (SYNTHROID, LEVOTHROID) 50 MCG tablet, Take 1 tablet (50 mcg total) by mouth daily., Disp: 30 tablet, Rfl: 1 LORazepam (ATIVAN) 1 MG tablet, Take 2 tablets (2 mg total) by mouth 2 (two) times daily., Disp: 60 tablet, Rfl: 0;  metFORMIN (FORTAMET) 1000 MG (OSM) 24 hr tablet, Take 1 tablet (1,000 mg total) by mouth daily with breakfast., Disp: 30 tablet, Rfl: 1;  pregabalin (LYRICA) 75 MG capsule, Take 150 mg by mouth daily. For nerve pain, Disp: , Rfl:  topiramate (TOPAMAX) 50 MG tablet, Take 1 tablet (50 mg total) by mouth daily as needed. For migraines, Disp: 30 tablet, Rfl: 0;  traZODone (DESYREL) 50 MG tablet, Take 100 mg by mouth at bedtime as needed for sleep. For sleep, Disp: , Rfl: ;  albuterol (PROVENTIL HFA;VENTOLIN HFA) 108 (90 BASE) MCG/ACT inhaler, Inhale 2 puffs into the lungs every 6 (six) hours as needed for wheezing., Disp: , Rfl:  Current facility-administered medications:0.9 %  sodium  chloride infusion, , Intravenous, Continuous, D Jeananne Rama, PA-C, Last Rate: 75 mL/hr at 02/23/13 0945, 75 mL/hr at 02/23/13 0945 Facility-Administered Medications Ordered in Other Encounters: indocyanine green (IC-GREEN) injection 75 mg, 75 mg, Intravenous, Once, Carmela Hurt, MD   Results for orders placed during the hospital encounter of 02/23/13 (from the past 48 hour(s))  CBC     Status: Abnormal   Collection Time    02/23/13  9:25 AM      Result Value Range   WBC 10.8 (*) 4.0 - 10.5 K/uL   RBC 5.13 (*) 3.87 - 5.11 MIL/uL   Hemoglobin 12.3  12.0 - 15.0 g/dL   HCT 16.1  09.6 - 04.5 %   MCV 74.5 (*) 78.0 - 100.0 fL   MCH 24.0 (*) 26.0 - 34.0 pg   MCHC 32.2  30.0 - 36.0 g/dL   RDW 40.9 (*) 81.1 - 91.4 %   Platelets 300  150 - 400 K/uL   No results found.  Review of Systems  Constitutional: Negative for fever and chills.  HENT:       Occ HA's; hx blindness left eye  Eyes: Negative for blurred vision and double vision.  Respiratory: Negative for cough and shortness of breath.   Cardiovascular: Negative for chest pain.  Gastrointestinal: Negative for nausea, vomiting and abdominal pain.  Genitourinary: Negative for dysuria and hematuria.       UTI about 2 weeks ago  Musculoskeletal: Positive for joint pain.       Recent left first toe ingrown toenail removal with residual erythema  Neurological: Negative for dizziness, sensory change, speech change, focal weakness and loss of consciousness.       Occ memory difficulties  Endo/Heme/Allergies: Does not bruise/bleed easily.    Blood pressure 133/75, pulse 81, temperature 97.6 F (36.4 C), temperature source Oral, resp. rate 18, height 5\' 7"  (1.702 m), weight 312 lb (141.522 kg), SpO2 98.00%. Physical Exam  Constitutional: She appears well-developed and well-nourished.  Cardiovascular: Normal rate and regular rhythm.   Respiratory: Effort normal and breath sounds normal.  GI: Soft. Bowel sounds are normal. There is no  tenderness.  obese  Musculoskeletal: Normal range of motion. She exhibits no edema.  Left first toe with overlying bandage- erythema noted per pt but no overt drainage  Neurological: She is alert.  Oriented to person, place, states today is Tuesday or wednesday     Assessment/Plan Pt with hx of large right posterior temporal lobe AVM embolization/ resection 05/2012. Plan is for follow up cerebral arteriogram today. Details/risks of procedure d/w pt/mother with their understanding and consent.  ALLRED,D KEVIN 02/23/2013, 10:11 AM

## 2013-02-23 NOTE — ED Notes (Signed)
Pt c/o 9/10 back pain; new orders received

## 2013-02-23 NOTE — Progress Notes (Signed)
Received  Pt alert and able to tolerate fluid.  Pt complain of lower back pain and will be medicated.

## 2014-03-03 IMAGING — CR DG CHEST 1V PORT
1 series · 1 of 1 positions shown · non-contrast
Comparison: 05/28/2012

CLINICAL DATA: Left central venous line placement

PORTABLE CHEST - 1 VIEW

[view not recorded]
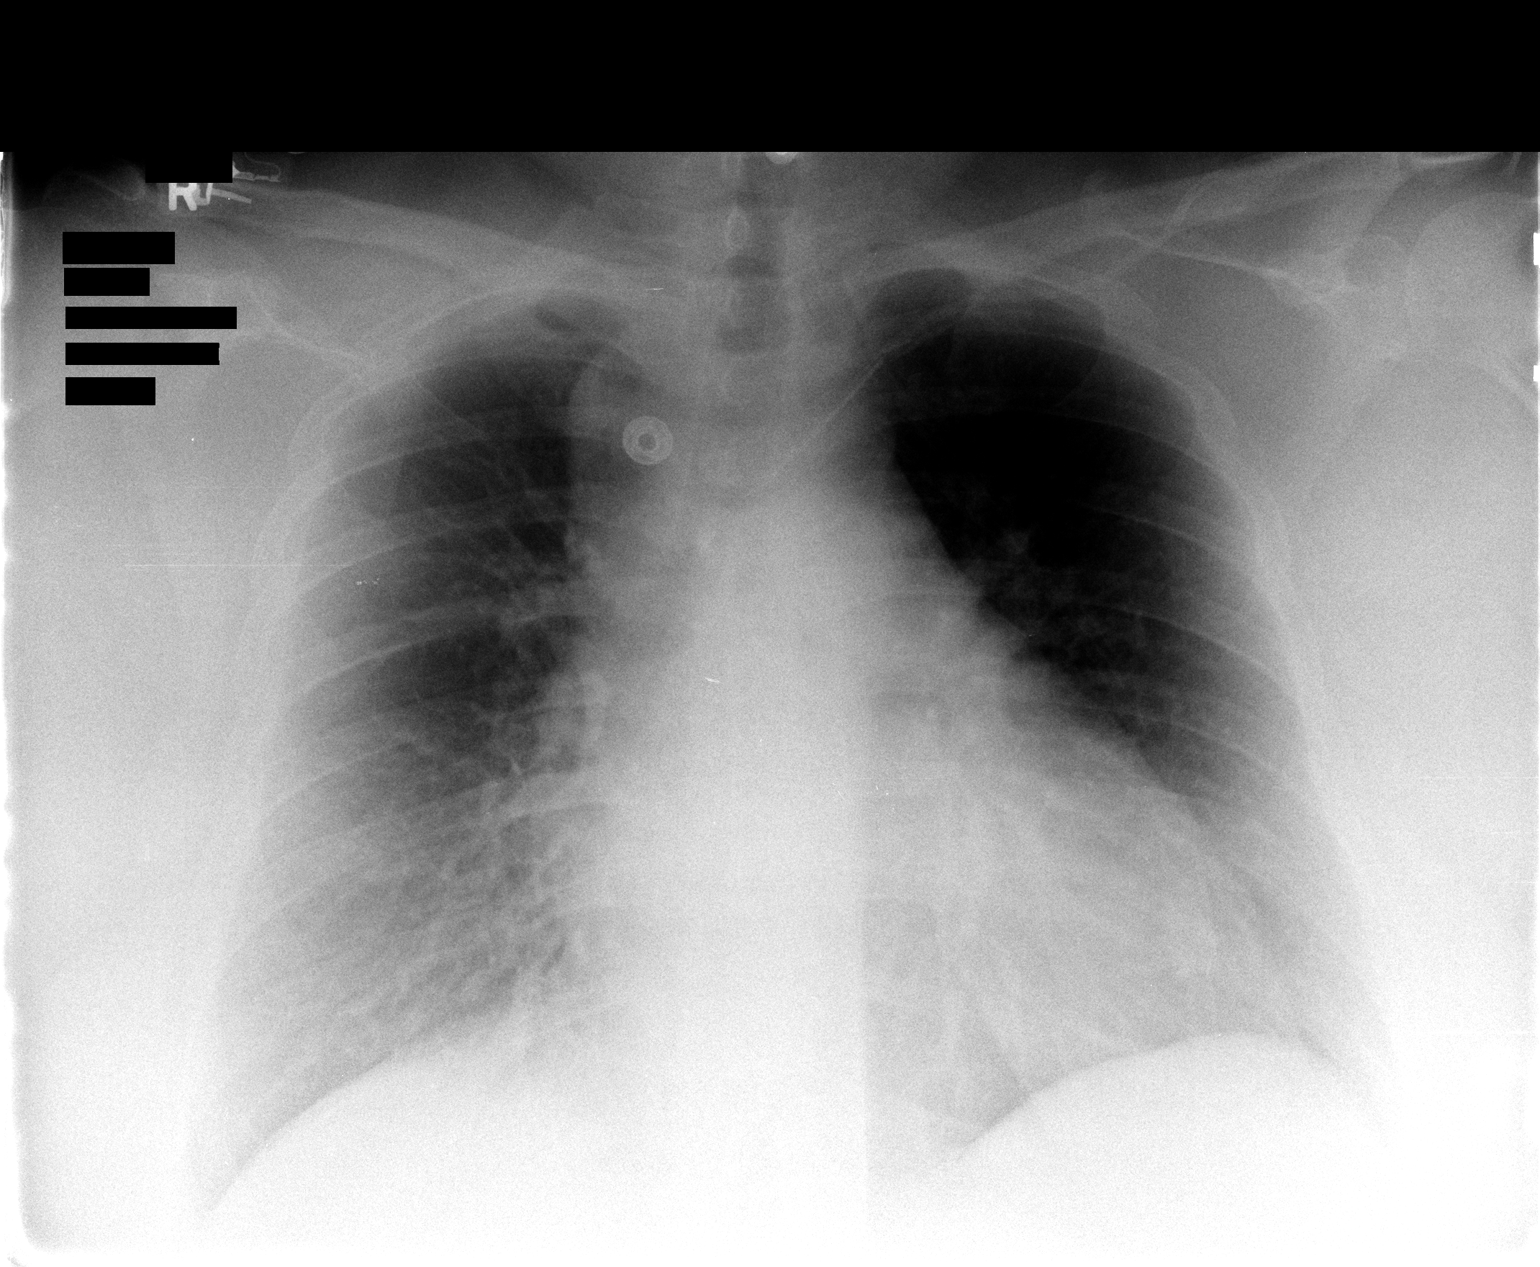

[1 of 1 positions shown; findings below may reference images not displayed]

FINDINGS: The study is limited by patient's large body habitus.  Cardiomegaly
again noted.  There is a left subclavian central line with tip in
the left innominate vein.  No diagnostic pneumothorax.  No acute
infiltrate or pulmonary edema.
IMPRESSION: 1.[The study is limited by patient's large body habitus.
Cardiomegaly again noted.  There is a left subclavian central line
with tip in the left innominate vein.  No diagnostic pneumothorax.
No acute infiltrate or pulmonary edema.]

## 2014-03-03 IMAGING — CT CT HEAD W/O CM
1 of 2 series · 15 of 30 positions shown, 19 images · non-contrast
Comparison: CT head without contrast 05/26/2012.

CLINICAL DATA: Headache.  Status post cranioplasty.  Resection of
AVM approximately 1 month ago.

CT HEAD WITHOUT CONTRAST
TECHNIQUE: Contiguous axial images were obtained from the base of
the skull through the vertex without contrast.

[Series 3: recon 2: brain · axial · 0.49mm/px · z∈[+136,+285]mm · 15 of 64 slices shown, 19 images]
[im 4/64  brain]
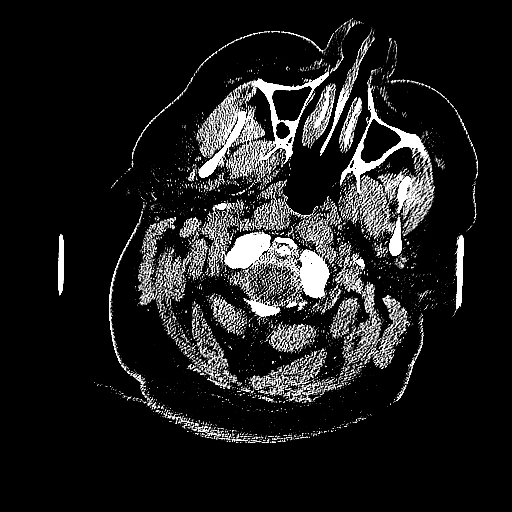
[im 4/64  bone]
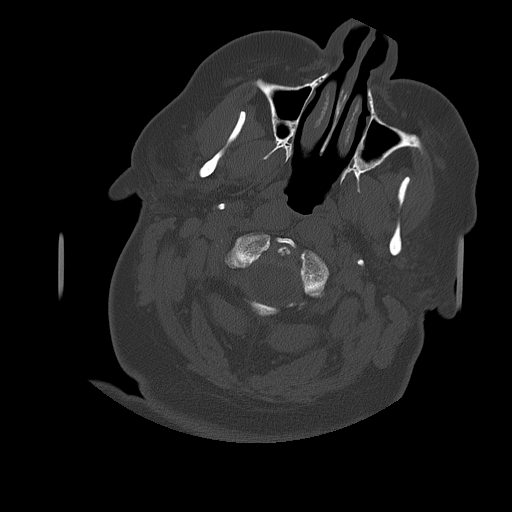
[im 7/64  brain]
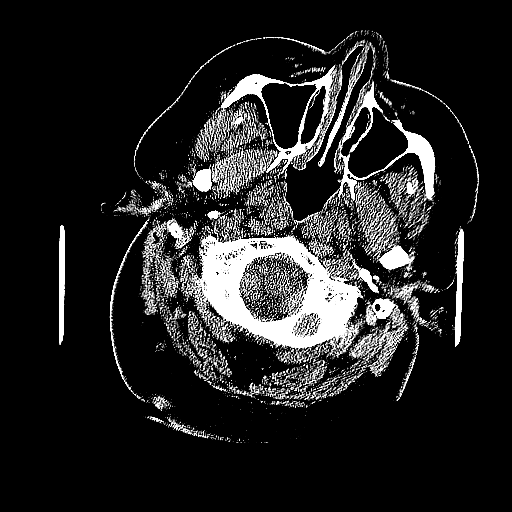
[im 14/64  brain]
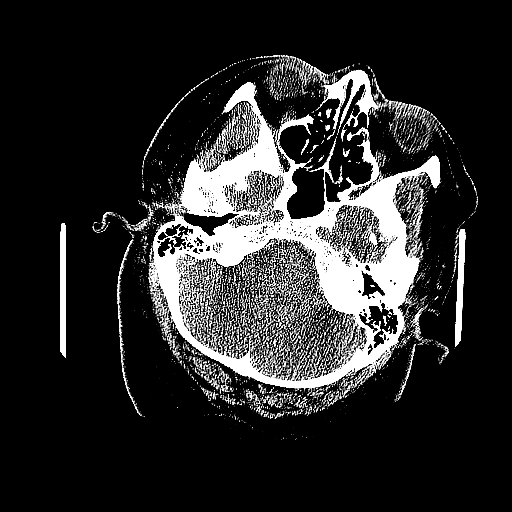
[im 17/64  brain]
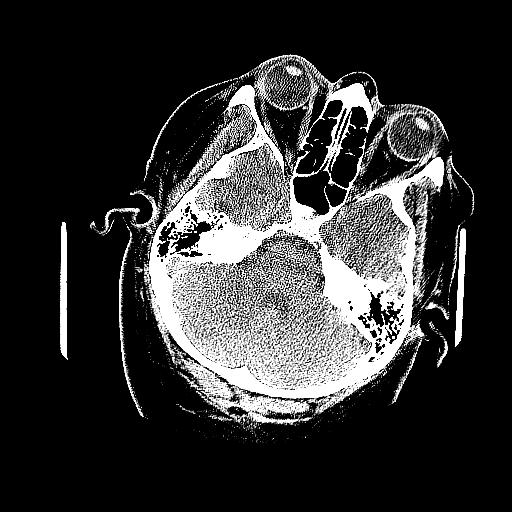
[im 20/64  brain]
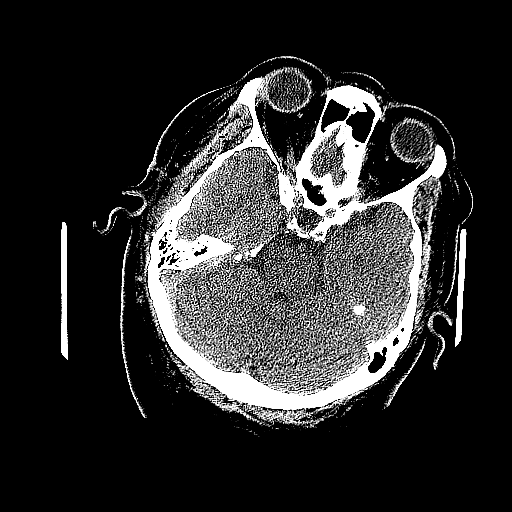
[im 20/64  bone]
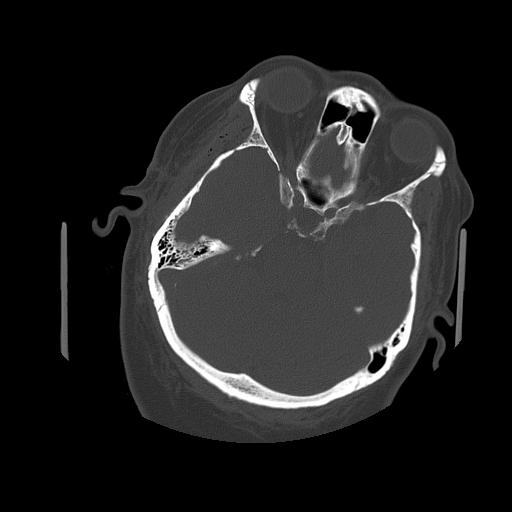
[im 24/64  brain]
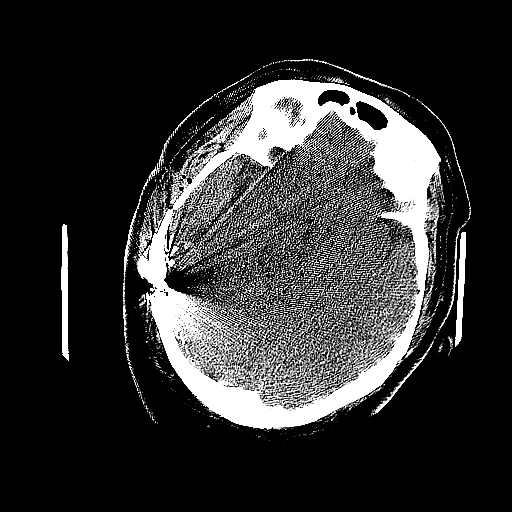
[im 27/64  brain]
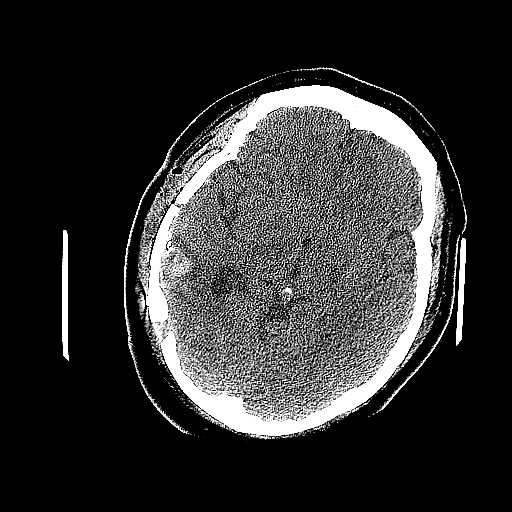
[im 34/64  brain]
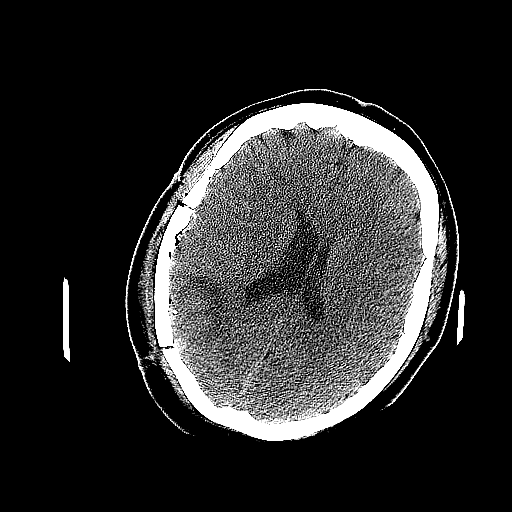
[im 37/64  brain]
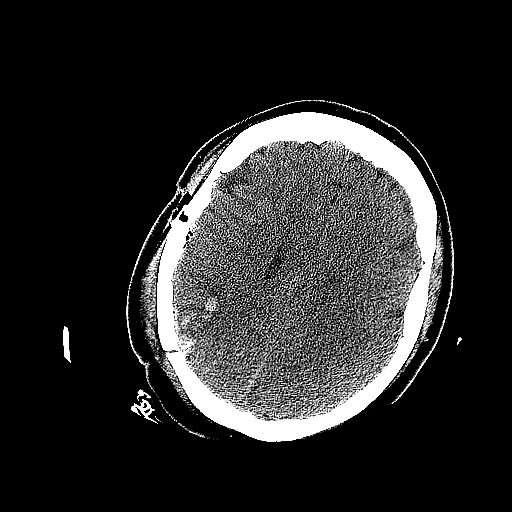
[im 37/64  bone]
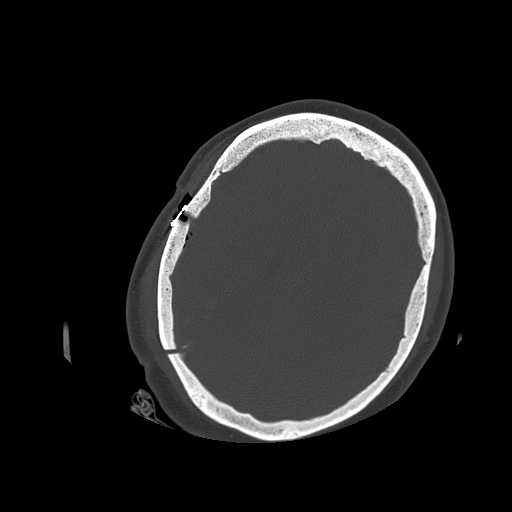
[im 40/64  brain]
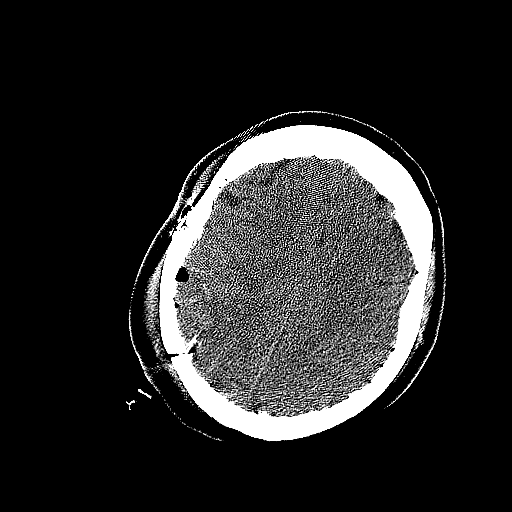
[im 44/64  brain]
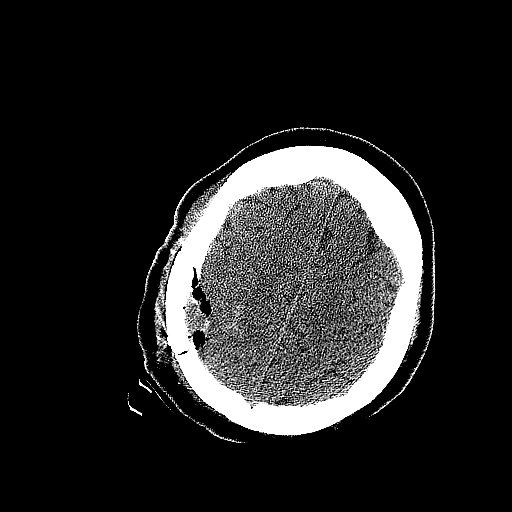
[im 47/64  brain]
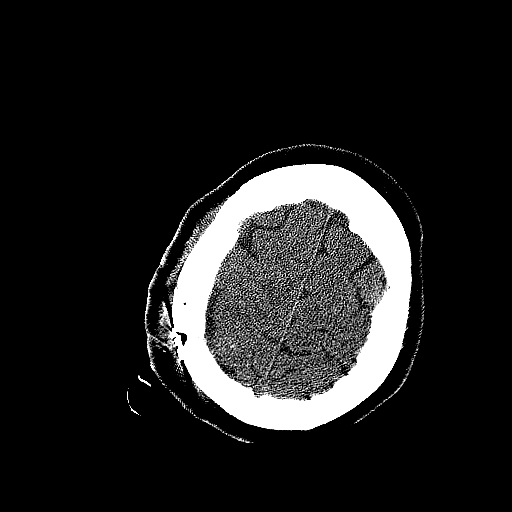
[im 54/64  brain]
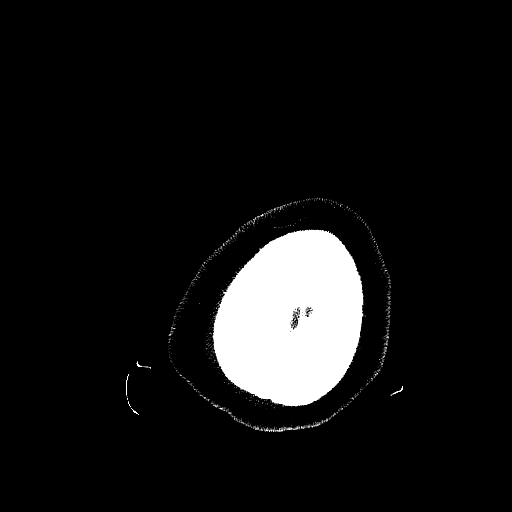
[im 54/64  bone]
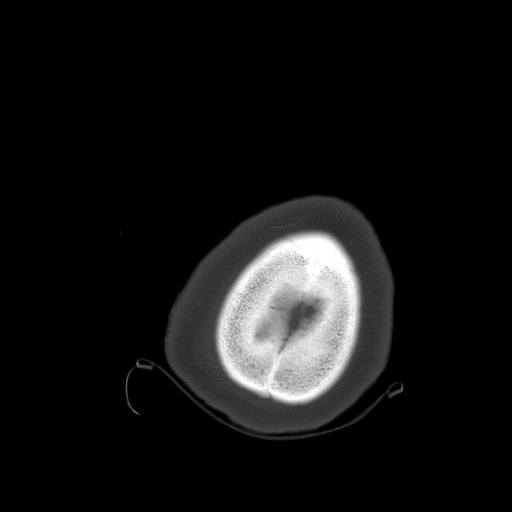
[im 57/64  brain]
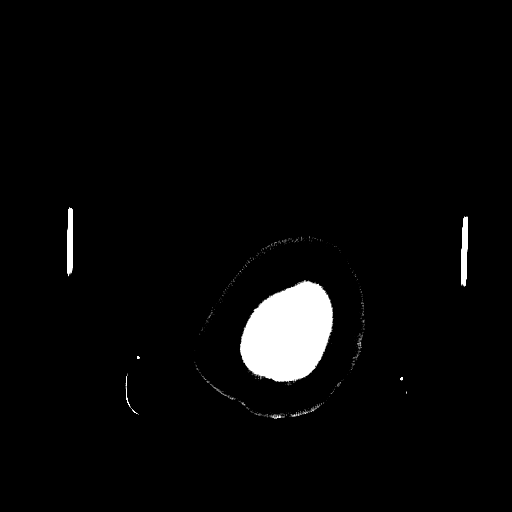
[im 60/64  brain]
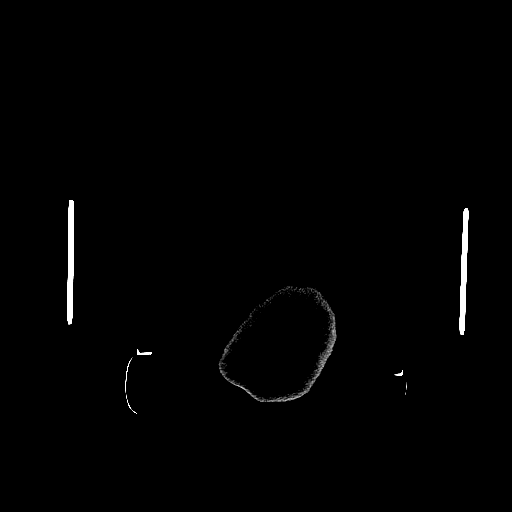

[15 of 30 positions shown; findings below may reference images not displayed]

FINDINGS: The craniotomy flap is in place.  The majority of the
right frontal parietal AVM has been resected.  There is some
residual intravascular coil material along the inferior aspect of
the parietal lobe.

Cortical and subcortical hemorrhage is present in the posterior
right frontal lobe with some local mass effect.  Right to left
midline shift at the foramen of Baudouin measures 4-5 mm.  A small
amount of subarachnoid blood is noted as well.  No significant left-
sided hemorrhage is present.  The left hemisphere is unremarkable.

The paranasal sinuses and mastoid air cells are clear.
IMPRESSION: 1.  Status post right frontal parietal cranioplasty.
2.  Interval resection of the majority of a right frontal and
parietal AVM.  There is some residual intravascular coil material
along the inferior right parietal and temporal lobe.
3.  Cortical and subcortical hemorrhage adjacent to the resection
cavity extending into the white matter.  While this may be
postoperative in nature, venous infarction is also considered.
4.  Midline shift of 4-5 mm.

Critical Value/emergent results were called by telephone at the
time of interpretation on 07/09/2012 at [DATE] p.m. to Dr. Menduf,
who verbally acknowledged these results.

## 2014-03-13 IMAGING — CR DG KNEE 1-2V*R*
2 series · 2 of 2 positions shown · non-contrast
Comparison: None.

CLINICAL DATA: Right knee pain with popping

RIGHT KNEE - 1-2 VIEW

[x knee ap right]
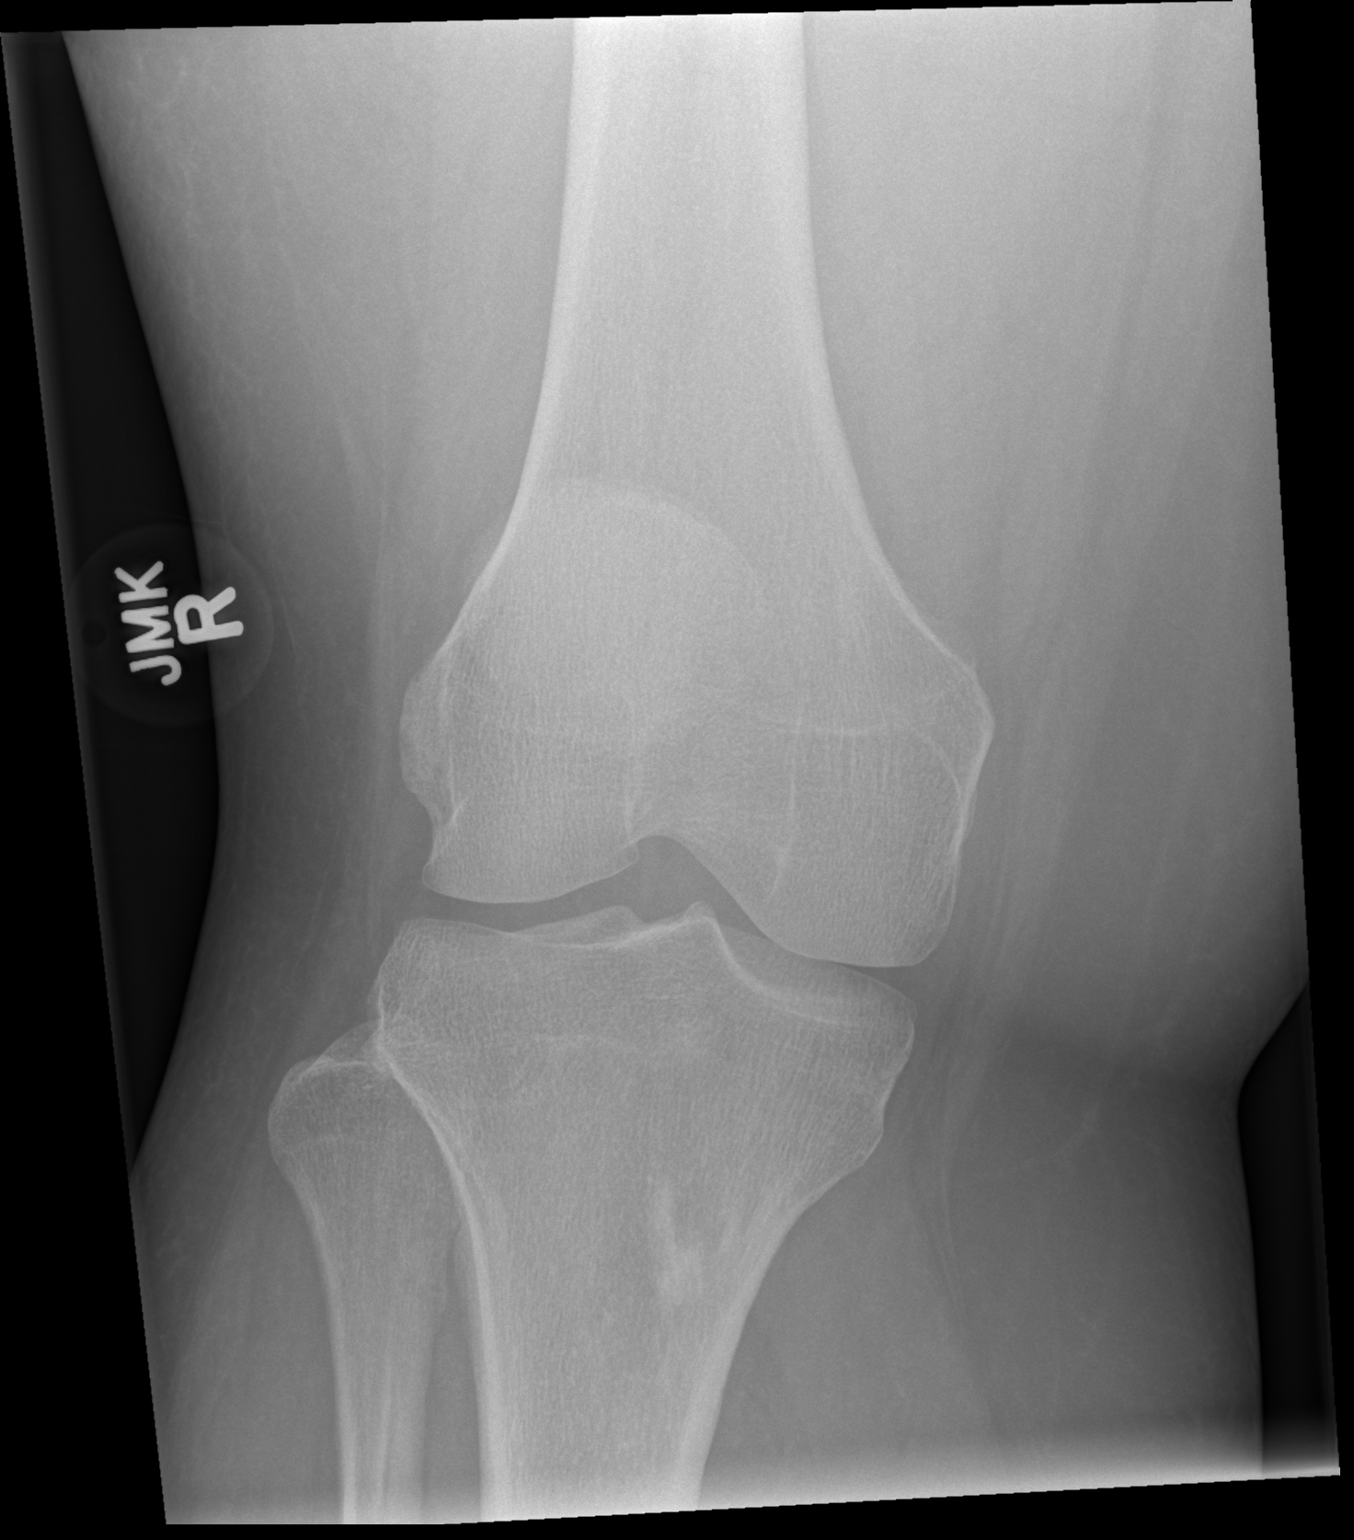

[x knee lat right]
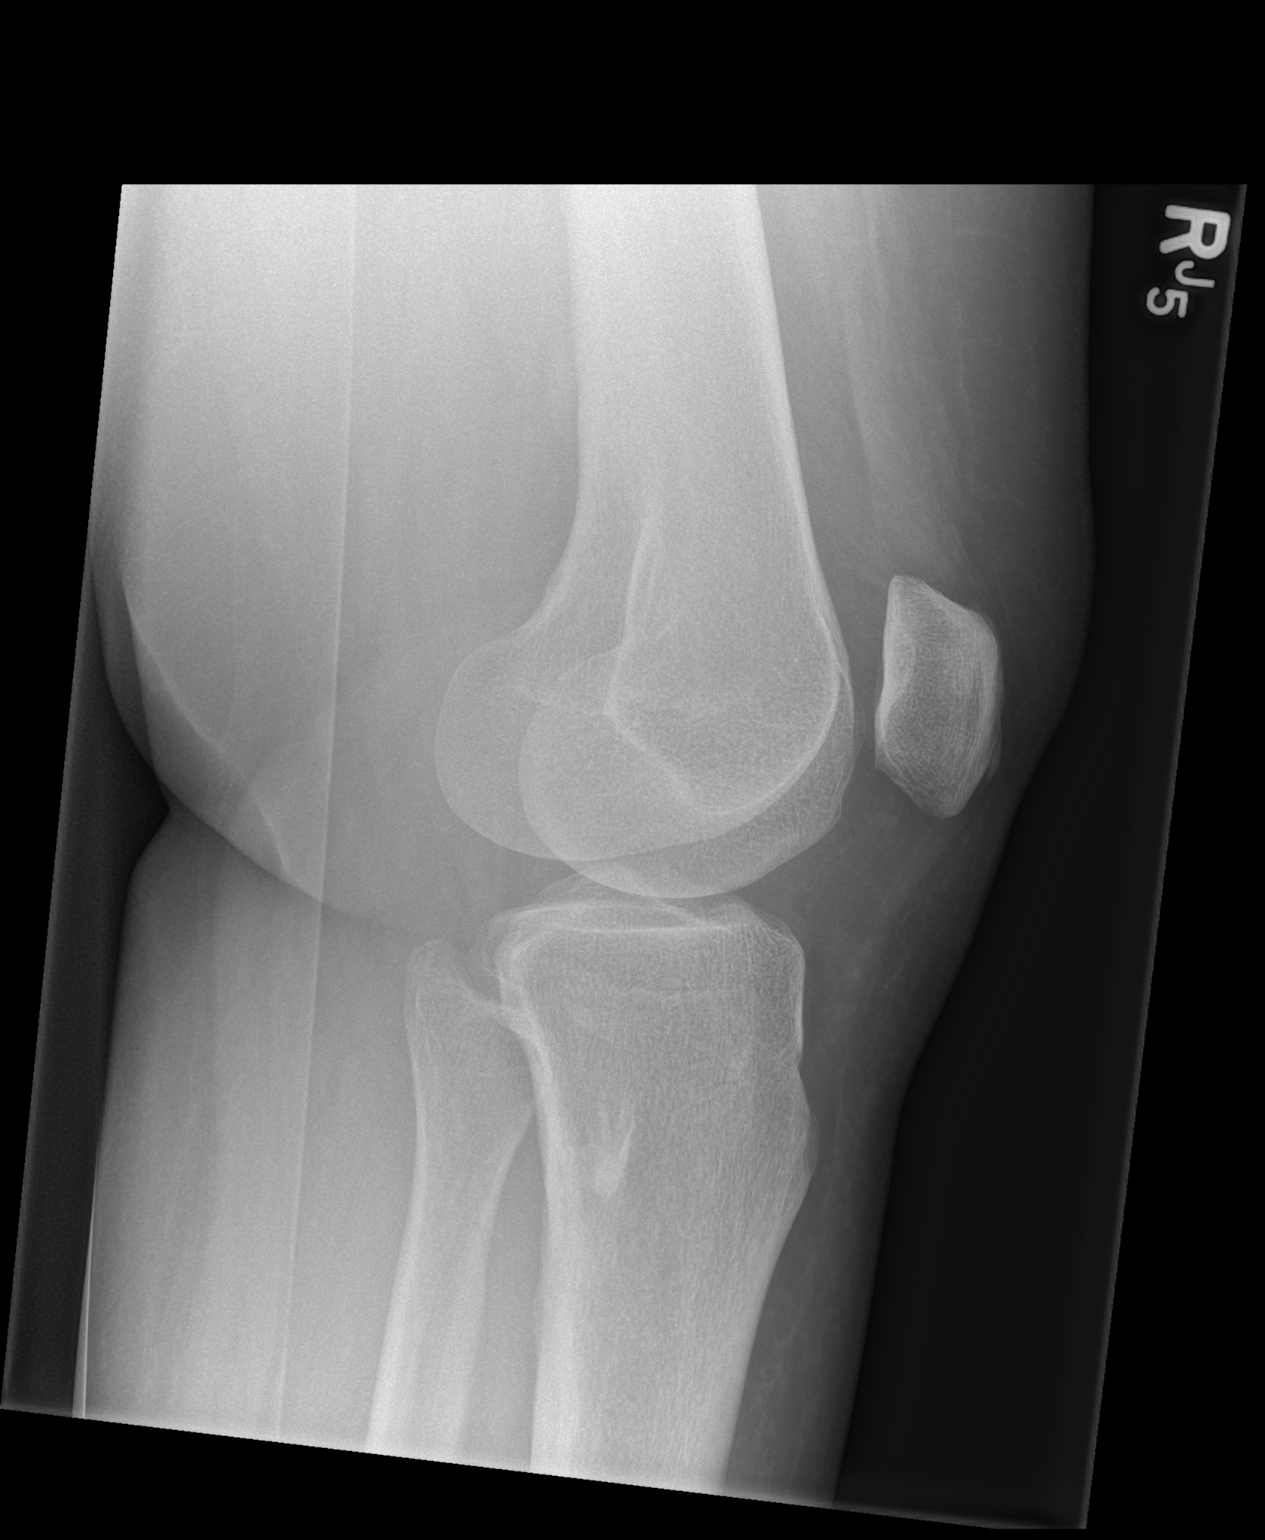

[2 of 2 positions shown; findings below may reference images not displayed]

FINDINGS: There may be only slightly decreased joint space
laterally.  No fracture is seen.  No effusion is noted.
IMPRESSION: No acute abnormality.  Perhaps slight decrease and lateral joint
space.

## 2014-05-18 ENCOUNTER — Telehealth (HOSPITAL_COMMUNITY): Payer: Self-pay | Admitting: Interventional Radiology

## 2014-05-18 NOTE — Telephone Encounter (Signed)
Called pt, left VM for her to call to discuss f/u JM

## 2014-06-14 ENCOUNTER — Other Ambulatory Visit (HOSPITAL_COMMUNITY): Payer: Self-pay | Admitting: Interventional Radiology

## 2014-06-14 ENCOUNTER — Telehealth (HOSPITAL_COMMUNITY): Payer: Self-pay | Admitting: Interventional Radiology

## 2014-06-14 DIAGNOSIS — Q2730 Arteriovenous malformation, site unspecified: Secondary | ICD-10-CM

## 2014-06-14 NOTE — Telephone Encounter (Signed)
Called pt to schedule her f/u angio with Deveshwar. She states that she will contact her insurance company and request something in writing from them saying they will cover the angiogram. Once she receives that she will call me back to schedule appt. JM

## 2014-07-20 ENCOUNTER — Telehealth (HOSPITAL_COMMUNITY): Payer: Self-pay

## 2014-07-20 NOTE — Telephone Encounter (Signed)
Called pt to schedule angiogram. Pt stated that she spoke with the insurance company and she will be responsible for paying for 80% of procedure. She wants to wait until it gets closer to the end of the year when she has at least met her deductible. She would like a call to schedule around November or December 2016. AW 

## 2016-01-22 ENCOUNTER — Other Ambulatory Visit (HOSPITAL_COMMUNITY): Payer: Self-pay | Admitting: Interventional Radiology

## 2016-01-22 DIAGNOSIS — Q2730 Arteriovenous malformation, site unspecified: Secondary | ICD-10-CM

## 2016-02-11 ENCOUNTER — Other Ambulatory Visit: Payer: Self-pay | Admitting: Radiology

## 2016-02-12 ENCOUNTER — Encounter (HOSPITAL_COMMUNITY): Payer: Self-pay

## 2016-02-12 ENCOUNTER — Ambulatory Visit (HOSPITAL_COMMUNITY)
Admission: RE | Admit: 2016-02-12 | Discharge: 2016-02-12 | Disposition: A | Discharge status: Home / Self Care | Payer: Managed Care, Other (non HMO) | Source: Ambulatory Visit | Attending: Interventional Radiology | Admitting: Interventional Radiology

## 2016-02-12 ENCOUNTER — Other Ambulatory Visit (HOSPITAL_COMMUNITY): Payer: Self-pay | Admitting: Interventional Radiology

## 2016-02-12 DIAGNOSIS — Z8249 Family history of ischemic heart disease and other diseases of the circulatory system: Secondary | ICD-10-CM | POA: Insufficient documentation

## 2016-02-12 DIAGNOSIS — Z803 Family history of malignant neoplasm of breast: Secondary | ICD-10-CM | POA: Diagnosis not present

## 2016-02-12 DIAGNOSIS — Z8774 Personal history of (corrected) congenital malformations of heart and circulatory system: Secondary | ICD-10-CM | POA: Diagnosis not present

## 2016-02-12 DIAGNOSIS — E1142 Type 2 diabetes mellitus with diabetic polyneuropathy: Secondary | ICD-10-CM | POA: Diagnosis not present

## 2016-02-12 DIAGNOSIS — G4730 Sleep apnea, unspecified: Secondary | ICD-10-CM | POA: Insufficient documentation

## 2016-02-12 DIAGNOSIS — K219 Gastro-esophageal reflux disease without esophagitis: Secondary | ICD-10-CM | POA: Diagnosis not present

## 2016-02-12 DIAGNOSIS — I6503 Occlusion and stenosis of bilateral vertebral arteries: Secondary | ICD-10-CM | POA: Insufficient documentation

## 2016-02-12 DIAGNOSIS — Z48812 Encounter for surgical aftercare following surgery on the circulatory system: Secondary | ICD-10-CM | POA: Diagnosis not present

## 2016-02-12 DIAGNOSIS — Z8041 Family history of malignant neoplasm of ovary: Secondary | ICD-10-CM | POA: Diagnosis not present

## 2016-02-12 DIAGNOSIS — J45909 Unspecified asthma, uncomplicated: Secondary | ICD-10-CM | POA: Diagnosis not present

## 2016-02-12 DIAGNOSIS — Z7984 Long term (current) use of oral hypoglycemic drugs: Secondary | ICD-10-CM | POA: Diagnosis not present

## 2016-02-12 DIAGNOSIS — Z833 Family history of diabetes mellitus: Secondary | ICD-10-CM | POA: Diagnosis not present

## 2016-02-12 DIAGNOSIS — F329 Major depressive disorder, single episode, unspecified: Secondary | ICD-10-CM | POA: Insufficient documentation

## 2016-02-12 DIAGNOSIS — E039 Hypothyroidism, unspecified: Secondary | ICD-10-CM | POA: Diagnosis not present

## 2016-02-12 DIAGNOSIS — I10 Essential (primary) hypertension: Secondary | ICD-10-CM | POA: Diagnosis not present

## 2016-02-12 DIAGNOSIS — E669 Obesity, unspecified: Secondary | ICD-10-CM | POA: Diagnosis not present

## 2016-02-12 DIAGNOSIS — Z6841 Body Mass Index (BMI) 40.0 and over, adult: Secondary | ICD-10-CM | POA: Diagnosis not present

## 2016-02-12 DIAGNOSIS — G43909 Migraine, unspecified, not intractable, without status migrainosus: Secondary | ICD-10-CM | POA: Diagnosis not present

## 2016-02-12 DIAGNOSIS — Q2730 Arteriovenous malformation, site unspecified: Secondary | ICD-10-CM

## 2016-02-12 DIAGNOSIS — Z801 Family history of malignant neoplasm of trachea, bronchus and lung: Secondary | ICD-10-CM | POA: Insufficient documentation

## 2016-02-12 DIAGNOSIS — Z87891 Personal history of nicotine dependence: Secondary | ICD-10-CM | POA: Insufficient documentation

## 2016-02-12 DIAGNOSIS — H5462 Unqualified visual loss, left eye, normal vision right eye: Secondary | ICD-10-CM | POA: Diagnosis not present

## 2016-02-12 DIAGNOSIS — M1712 Unilateral primary osteoarthritis, left knee: Secondary | ICD-10-CM | POA: Diagnosis not present

## 2016-02-12 DIAGNOSIS — G8929 Other chronic pain: Secondary | ICD-10-CM | POA: Insufficient documentation

## 2016-02-12 DIAGNOSIS — F419 Anxiety disorder, unspecified: Secondary | ICD-10-CM | POA: Insufficient documentation

## 2016-02-12 HISTORY — PX: IR GENERIC HISTORICAL: IMG1180011

## 2016-02-12 LAB — BASIC METABOLIC PANEL
Anion gap: 11 (ref 5–15)
BUN: 10 mg/dL (ref 6–20)
CHLORIDE: 107 mmol/L (ref 101–111)
CO2: 20 mmol/L — AB (ref 22–32)
CREATININE: 0.85 mg/dL (ref 0.44–1.00)
Calcium: 9 mg/dL (ref 8.9–10.3)
GFR calc non Af Amer: >60 mL/min (ref >60)
Glucose, Bld: 163 mg/dL — ABNORMAL HIGH (ref 65–99)
POTASSIUM: 3.9 mmol/L (ref 3.5–5.1)
SODIUM: 138 mmol/L (ref 135–145)

## 2016-02-12 LAB — URINALYSIS, COMPLETE (UACMP) WITH MICROSCOPIC
BILIRUBIN URINE: NEGATIVE
Glucose, UA: NEGATIVE mg/dL
Hgb urine dipstick: NEGATIVE
KETONES UR: NEGATIVE mg/dL
Leukocytes, UA: NEGATIVE
Nitrite: NEGATIVE
PROTEIN: NEGATIVE mg/dL
SPECIFIC GRAVITY, URINE: 1.023 (ref 1.005–1.030)
pH: 5 (ref 5.0–8.0)

## 2016-02-12 LAB — CBC
HCT: 40.1 % (ref 36.0–46.0)
HEMOGLOBIN: 13.1 g/dL (ref 12.0–15.0)
MCH: 24.9 pg — AB (ref 26.0–34.0)
MCHC: 32.7 g/dL (ref 30.0–36.0)
MCV: 76.2 fL — AB (ref 78.0–100.0)
PLATELETS: 315 10*3/uL (ref 150–400)
RBC: 5.26 MIL/uL — AB (ref 3.87–5.11)
RDW: 16.9 % — ABNORMAL HIGH (ref 11.5–15.5)
WBC: 12.4 10*3/uL — AB (ref 4.0–10.5)

## 2016-02-12 LAB — GLUCOSE, CAPILLARY
GLUCOSE-CAPILLARY: 166 mg/dL — AB (ref 65–99)
Glucose-Capillary: 122 mg/dL — ABNORMAL HIGH (ref 65–99)

## 2016-02-12 LAB — PROTIME-INR
INR: 0.98
Prothrombin Time: 13 seconds (ref 11.4–15.2)

## 2016-02-12 LAB — APTT: aPTT: 30 seconds (ref 24–36)

## 2016-02-12 MED ORDER — LIDOCAINE HCL 1 % IJ SOLN
INTRAMUSCULAR | Status: AC | PRN
  Administered 2016-02-12: 15 mL

## 2016-02-12 MED ORDER — HEPARIN SODIUM (PORCINE) 1000 UNIT/ML IJ SOLN
INTRAMUSCULAR | Status: AC
  Filled 2016-02-12: qty 2

## 2016-02-12 MED ORDER — FENTANYL CITRATE (PF) 100 MCG/2ML IJ SOLN
INTRAMUSCULAR | Status: AC | PRN
  Administered 2016-02-12: 25 ug via INTRAVENOUS

## 2016-02-12 MED ORDER — MIDAZOLAM HCL 2 MG/2ML IJ SOLN
INTRAMUSCULAR | Status: AC
  Filled 2016-02-12: qty 2

## 2016-02-12 MED ORDER — SODIUM CHLORIDE 0.9 % IV SOLN: Freq: Once | INTRAVENOUS | Status: DC

## 2016-02-12 MED ORDER — LIDOCAINE HCL 1 % IJ SOLN
INTRAMUSCULAR | Status: AC
  Filled 2016-02-12: qty 20

## 2016-02-12 MED ORDER — SODIUM CHLORIDE 0.9 % IV SOLN: INTRAVENOUS | Status: AC

## 2016-02-12 MED ORDER — IOPAMIDOL (ISOVUE-300) INJECTION 61%
INTRAVENOUS | Status: AC
  Administered 2016-02-12: 70 mL
  Filled 2016-02-12: qty 150

## 2016-02-12 MED ORDER — HEPARIN SODIUM (PORCINE) 1000 UNIT/ML IJ SOLN
INTRAMUSCULAR | Status: AC | PRN
  Administered 2016-02-12: 1000 [IU] via INTRAVENOUS

## 2016-02-12 MED ORDER — IOPAMIDOL (ISOVUE-300) INJECTION 61%
INTRAVENOUS | Status: AC
  Filled 2016-02-12: qty 50

## 2016-02-12 MED ORDER — MIDAZOLAM HCL 2 MG/2ML IJ SOLN
INTRAMUSCULAR | Status: AC | PRN
  Administered 2016-02-12: 1 mg via INTRAVENOUS

## 2016-02-12 MED ORDER — SODIUM CHLORIDE 0.9 % IV SOLN
INTRAVENOUS | Status: AC | PRN
  Administered 2016-02-12: 10 mL/h via INTRAVENOUS

## 2016-02-12 MED ORDER — FENTANYL CITRATE (PF) 100 MCG/2ML IJ SOLN
INTRAMUSCULAR | Status: AC
  Filled 2016-02-12: qty 2

## 2016-02-12 MED ORDER — HYDROCODONE-ACETAMINOPHEN 5-325 MG PO TABS: 1.0000 | ORAL_TABLET | Freq: Four times a day (QID) | ORAL | Status: DC | PRN

## 2016-02-12 NOTE — Sedation Documentation (Signed)
Right  Groin and pulses checked with Eunice Blaseebbie, RN

## 2016-02-12 NOTE — H&P (Signed)
Chief Complaint: Patient was seen in consultation today for cerebral arteriogram at the request of Dr Coletta MemosKyle Cabbell  Referring Physician(s): Dr Coletta MemosKyle Cabbell  Supervising Physician: Julieanne Cottoneveshwar, Sanjeev  [facility]  History of Present Illness: Olivia Payne is a 45 y.o. 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 States no weakness; or speech/vision issues.  Now scheduled for follow up arteriogram to evaluate AVM resection   Past Medical History:  Diagnosis Date  . Allergy   . Anxiety   . Asthma   . 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 Has had multiple procedures.   . Cerebral aneurysm   . Cerebral AVM   . Depression   . Diabetes mellitus with other specified manifestations    peripheral neuropathy  . Facial pain    since intubation  . GERD (gastroesophageal reflux disease)    hx  . History of chicken pox   . Hypertension   . Hypothyroidism   . Migraine   . Neuropathy (HCC)   . OA (osteoarthritis) of knee    left knee with chronic pain  . Sleep apnea    does not use Cpap  . Thyroid disease     Past Surgical History:  Procedure Laterality Date  . cerebral arteriogram    . CESAREAN SECTION  1995  . CRANIOPLASTY Right 07/09/2012   Procedure: CRANIOPLASTY;  Surgeon: Carmela HurtKyle L Cabbell, MD;  Location: [facility];  Service: Neurosurgery;  Laterality: Right;  Cranioplasty with bone retrieval from abdominal pocket  . 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 PLACEMENT OF BONE FLAB IN ABDOMINAL WALL;  Surgeon: Carmela HurtKyle L Cabbell, MD;  Location: [facility];  Service: Neurosurgery;  Laterality: Right;  RIGHT Craniectomy for arteriovenous  malformation resection  . DILATION AND CURETTAGE OF UTERUS  06/07/2002  . EYE SURGERY  01/12/2013   laser   . RADIOLOGY WITH ANESTHESIA N/A 04/20/2012   Procedure: RADIOLOGY WITH ANESTHESIA;  Surgeon: Oneal GroutSanjeev K Deveshwar, MD;  Location: [facility];  Service: Radiology;  Laterality: N/A;  . RADIOLOGY WITH ANESTHESIA N/A 05/26/2012   Procedure: RADIOLOGY WITH ANESTHESIA;  Surgeon: Oneal GroutSanjeev K Deveshwar, MD;  Location: [facility] OR;  Service: Radiology;  Laterality: N/A;    Allergies: Cleocin [clindamycin hcl] and Latex  Medications: Prior to Admission medications   Medication Sig Start Date End Date Taking? Authorizing Provider  albuterol (PROVENTIL HFA;VENTOLIN HFA) 108 (90 BASE) MCG/ACT inhaler Inhale 2 puffs into the lungs every 6 (six) hours as needed for wheezing.   Yes Historical Provider, MD  cetirizine (ZYRTEC) 10 MG tablet Take 10 mg by mouth daily.   Yes Historical Provider, MD  DULoxetine (CYMBALTA) 60 MG capsule Take 60 mg by mouth daily.   Yes Historical Provider, MD  escitalopram (LEXAPRO) 20 MG tablet Take 20 mg by mouth daily.   Yes Historical Provider, MD  levothyroxine (SYNTHROID, LEVOTHROID) 75 MCG tablet Take 75 mcg by mouth daily before breakfast.   Yes Historical Provider, MD  lisinopril (PRINIVIL,ZESTRIL) 5 MG tablet Take 5 mg by mouth daily.   Yes Historical Provider, MD  LORazepam (ATIVAN) 1 MG tablet Take 2 tablets (2 mg total) by mouth 2 (two) times daily. Patient taking differently:  Take 2 mg by mouth at bedtime as needed for sleep.  08/03/12  Yes Daniel J Angiulli, PA-C  metFORMIN (FORTAMET) 1000 MG (OSM) 24 hr tablet Take 1 tablet (1,000 mg total) by mouth daily with breakfast. Patient taking differently: Take 1,000 mg by mouth 2 (two) times daily with a meal.  08/03/12  Yes Daniel J Angiulli, PA-C  norethindrone (AYGESTIN) 5 MG tablet Take 2.5 mg by mouth daily.   Yes Historical Provider, MD  pregabalin (LYRICA) 150 MG capsule Take 150 mg by mouth at bedtime. For nerve pain   08/03/12  Yes Daniel J Angiulli, PA-C  Probiotic Product (PROBIOTIC DAILY PO) Take 1 capsule by mouth daily.   Yes Historical Provider, MD  Propylene Glycol (SYSTANE BALANCE OP) Place 3 drops into both eyes daily.   Yes Historical Provider, MD  topiramate (TOPAMAX) 50 MG tablet Take 1 tablet (50 mg total) by mouth daily as needed. For migraines Patient taking differently: Take 50 mg by mouth at bedtime. For migraines 08/03/12  Yes Mcarthur Rossetti Angiulli, PA-C     Family History  Problem Relation Age of Onset  . Cancer Mother     Breast Cancer  . Diabetes Father   . Heart disease Father   . Heart attack Father 26  . Cancer Paternal Aunt     Ovarian Cancer  . Diabetes Paternal Grandmother   . Cancer Other     Lung Cancer-Maternal side of family    Social History   Social History  . Marital status: Married    Spouse name: N/A  . Number of children: 2  . Years of education: N/A   Occupational History  . ANALYST [employer]   Social History Main Topics  . Smoking status: Former Smoker    Packs/day: 1.50    Years: 26.00    Types: Cigarettes    Quit date: 07/08/2003  . Smokeless tobacco: None     Comment: quit smoking in 2005  . Alcohol use No  . Drug use: No  . Sexual activity: Not Currently   Other Topics Concern  . None   Social History Narrative   Caffeine Use-no          Review of Systems: A 12 point ROS discussed and pertinent positives are indicated in the HPI above.  All other systems are negative.  Review of Systems  Constitutional: Negative for activity change, appetite change, fatigue, fever and unexpected weight change.  HENT: Negative for hearing loss, tinnitus, trouble swallowing and voice change.   Eyes: Negative for visual disturbance.  Respiratory: Negative for cough and shortness of breath.   Cardiovascular: Negative for chest pain.  Gastrointestinal: Negative for abdominal pain and nausea.  Musculoskeletal: Negative for back pain and gait problem.    Neurological: Negative for dizziness, tremors, seizures, syncope, facial asymmetry, speech difficulty, weakness, light-headedness, numbness and headaches.  Psychiatric/Behavioral: Negative for behavioral problems and confusion.    Vital Signs: BP 126/89   Pulse 93   Temp 98.4 F (36.9 C) (Oral)   Resp 20   Ht 5\' 7"  (1.702 m)   Wt 296 lb (134.3 kg)   SpO2 97%   BMI 46.36 kg/m   Physical Exam  Constitutional:  obese  Eyes: EOM are normal.  Neck: Neck supple.  Cardiovascular: Normal rate, regular rhythm and normal heart sounds.   No murmur heard. Pulmonary/Chest: Effort normal and breath sounds normal. She has no wheezes.  Abdominal: Soft. Bowel sounds are normal. There is no tenderness.  Musculoskeletal: Normal range of motion. She exhibits tenderness. She exhibits no edema.  Right knee pain  Neurological: She is alert.  Skin: Skin is warm and dry.  Psychiatric: She has a normal mood and affect. Her behavior is normal. Judgment and thought content normal.  Nursing note and vitals reviewed.   Mallampati Score:  MD Evaluation Airway: WNL Heart: WNL Abdomen: WNL Chest/ Lungs: WNL ASA  Classification: 3 Mallampati/Airway Score: One  Imaging: No results found.  Labs:  CBC: No results for input(s): WBC, HGB, HCT, PLT in the last 8760 hours.  COAGS: No results for input(s): INR, APTT in the last 8760 hours.  BMP: No results for input(s): NA, K, CL, CO2, GLUCOSE, BUN, CALCIUM, CREATININE, GFRNONAA, GFRAA in the last 8760 hours.  Invalid input(s): CMP  LIVER FUNCTION TESTS: No results for input(s): BILITOT, AST, ALT, ALKPHOS, PROT, ALBUMIN in the last 8760 hours.  TUMOR MARKERS: No results for input(s): AFPTM, CEA, CA199, CHROMGRNA in the last 8760 hours.  Assessment and Plan:  Hx R frontal AVM embolization 05/2012 Resection with Dr Franky Machoabbell 05/2012 6 mo follow up angio did reveal no residual AVM No follow up since then. Now for cerebral arteriogram Risks  and Benefits discussed with the patient including, but not limited to bleeding, infection, vascular injury, contrast induced renal failure, stroke or even death. All of the patient's questions were answered, patient is agreeable to proceed. Consent signed and in chart.  Thank you for this interesting consult.  I greatly enjoyed meeting Olivia BoundsSabrina V Wiesen and look forward to participating in their care.  A copy of this report was sent to the requesting provider on this date.  Electronically Signed: Ralene MuskratURPIN,Vincenzo Stave A 02/12/2016, 7:23 AM   I spent a total of  30 Minutes   in face to face in clinical consultation, greater than 50% of which was counseling/coordinating care for cerebral arteriogram

## 2016-02-12 NOTE — Sedation Documentation (Signed)
5 Fr. Exoseal to right groin 

## 2016-02-12 NOTE — Discharge Instructions (Signed)

## 2016-02-12 NOTE — Sedation Documentation (Signed)
ETCO2 removed per Dr. Deveshwar 

## 2016-02-12 NOTE — Procedures (Signed)
S/P 4 vessel cerebral arteriogram. Rt CFA approach.. Findings. 1.angiographically no evidence of recurrent,or residual AVM or AV shunting. 2.No evidence of aneurysm. 3. Venous outflow WNL

## 2016-02-13 ENCOUNTER — Encounter (HOSPITAL_COMMUNITY): Payer: Self-pay | Admitting: Interventional Radiology

## 2017-06-24 LAB — HEMOGLOBIN A1C: Hemoglobin A1C: 10

## 2017-08-19 ENCOUNTER — Encounter: Payer: Self-pay | Admitting: Nutrition

## 2017-08-19 ENCOUNTER — Encounter: Payer: 59 | Attending: Family Medicine | Admitting: Nutrition

## 2017-08-19 VITALS — Ht 67.0 in | Wt 290.0 lb

## 2017-08-19 DIAGNOSIS — Z6841 Body Mass Index (BMI) 40.0 and over, adult: Secondary | ICD-10-CM | POA: Insufficient documentation

## 2017-08-19 DIAGNOSIS — Z713 Dietary counseling and surveillance: Secondary | ICD-10-CM | POA: Insufficient documentation

## 2017-08-19 DIAGNOSIS — E119 Type 2 diabetes mellitus without complications: Secondary | ICD-10-CM | POA: Diagnosis not present

## 2017-08-19 DIAGNOSIS — E118 Type 2 diabetes mellitus with unspecified complications: Secondary | ICD-10-CM

## 2017-08-19 DIAGNOSIS — IMO0002 Reserved for concepts with insufficient information to code with codable children: Secondary | ICD-10-CM

## 2017-08-19 DIAGNOSIS — E782 Mixed hyperlipidemia: Secondary | ICD-10-CM

## 2017-08-19 DIAGNOSIS — E6609 Other obesity due to excess calories: Secondary | ICD-10-CM | POA: Insufficient documentation

## 2017-08-19 DIAGNOSIS — E1165 Type 2 diabetes mellitus with hyperglycemia: Secondary | ICD-10-CM

## 2017-08-19 DIAGNOSIS — I10 Essential (primary) hypertension: Secondary | ICD-10-CM | POA: Insufficient documentation

## 2017-08-19 NOTE — Progress Notes (Signed)
  Medical Nutrition Therapy:  Appt start time:  1530 end time:  1630.  Assessment:  Primary concerns today: Diabetes Type 2 and Obesity..PMH: Brain and eye surgery. HTN, Hyperlipidemia. Been diabetic since 2013. Sees Nicholes RoughKatie Speilman at [facility]. Most recent A1C 13% in April 2019. Is on Metformin 1000 mg BID. BS are running 190-240's.. Eats 3 meals per day. Can't cook so eats most meals away from home. Has a stressful job. Testing blood sugars twice a day. Diet is high in processed foods that are high in fat, salt and CHO's contributing to her obesity, HTN and Hyperlipidemia and uncontrolled DM. Walks on treadmill a little a few times per week.  Works full time. Cholesterol level 252 mg/dl.  Current diet is excessive to meet her needs.  Preferred Learning Style:  No preference indicated   Learning Readiness:   Not ready  Contemplating  Ready  Change in progress   MEDICATIONS: See list   DIETARY INTAKE: 24-hr recall:  B ( AM): Oatmeal, blueberries and milk Snk ( AM):  L ( PM): 2 apples and pb, water Snk ( PM):  D ( PM): Double cheeseburger MLO and tator tots Snk ( PM):  Beverages: water. Crystal light.  Usual physical activity walking  Estimated energy needs: 1500 calories 170 g carbohydrates 112 g protein 42 g fat  Progress Towards Goal(s):  In progress.   Nutritional Diagnosis:  NB-1.1 Food and nutrition-related knowledge deficit As related to Diabetes .  As evidenced by A1C 13%..    Intervention:  Nutrition and Diabetes education provided on My Plate, CHO counting, meal planning, portion sizes, timing of meals, avoiding snacks between meals unless having a low blood sugar, target ranges for A1C and blood sugars, signs/symptoms and treatment of hyper/hypoglycemia, monitoring blood sugars, taking medications as prescribed, benefits of exercising 30 minutes per day and prevention of complications of DM.  Goals 1. Follow MY Plate Eat three meals a day  times discussed. 2. Increase fresh fruits and vegetables. 4. Cut out high fat salty foods 5. Cut out crystal light and just do water Cut out processed foods and fast foods.  Get A1C down to 7%   .Teaching Method Utilized:  Visual Auditory Hands on  Handouts given during visit include:  The Plate Method   Meal Plan Card   Barriers to learning/adherence to lifestyle change: none  Demonstrated degree of understanding via:  Teach Back   Monitoring/Evaluation:  Dietary intake, exercise, meal planning, and body weight in 1 month(s). Would recommend to consider adding Januvia or a GLP1 to help lower blood sugars if A1C is > 8% next visit. May benefit from referral to Endocrinology.

## 2017-08-19 NOTE — Patient Instructions (Addendum)
Goals 1. Follow MY Plate Eat three meals a day times discussed. 2. Increase fresh fruits and vegetables. 4. Cut out high fat salty foods 5. Cut out crystal light and just do water Cut out processed foods and fast foods. Get A1C down to 7%.

## 2017-09-28 ENCOUNTER — Encounter: Payer: Self-pay | Admitting: "Endocrinology

## 2017-09-28 ENCOUNTER — Ambulatory Visit (INDEPENDENT_AMBULATORY_CARE_PROVIDER_SITE_OTHER): Payer: 59 | Admitting: "Endocrinology

## 2017-09-28 VITALS — BP 112/76 | HR 98 | Ht 66.0 in | Wt 288.6 lb

## 2017-09-28 DIAGNOSIS — E039 Hypothyroidism, unspecified: Secondary | ICD-10-CM | POA: Diagnosis not present

## 2017-09-28 DIAGNOSIS — I10 Essential (primary) hypertension: Secondary | ICD-10-CM | POA: Insufficient documentation

## 2017-09-28 DIAGNOSIS — E119 Type 2 diabetes mellitus without complications: Secondary | ICD-10-CM | POA: Insufficient documentation

## 2017-09-28 DIAGNOSIS — E1165 Type 2 diabetes mellitus with hyperglycemia: Secondary | ICD-10-CM | POA: Diagnosis not present

## 2017-09-28 MED ORDER — METFORMIN HCL ER (OSM) 1000 MG PO TB24: 1000.0000 mg | ORAL_TABLET | Freq: Every day | ORAL | 1 refills | Status: DC

## 2017-09-28 NOTE — Patient Instructions (Signed)

## 2017-09-28 NOTE — Progress Notes (Signed)
Endocrinology Consult Note       09/28/2017, 4:46 PM   Subjective:    Patient ID: Olivia Payne, female    DOB: October 12, 1970.  Olivia Payne is being seen in consultation for management of currently uncontrolled symptomatic diabetes requested by  Richardean Chimera, MD.   Past Medical History:  Diagnosis Date  . Allergy   . Anxiety   . Asthma   . 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 Has had multiple procedures.   . Cerebral aneurysm   . Cerebral AVM   . Depression   . Diabetes mellitus with other specified manifestations    peripheral neuropathy  . Facial pain    since intubation  . GERD (gastroesophageal reflux disease)    hx  . History of chicken pox   . Hypertension   . Hypothyroidism   . Migraine   . Neuropathy   . OA (osteoarthritis) of knee    left knee with chronic pain  . Sleep apnea    does not use Cpap  . Thyroid disease    Past Surgical History:  Procedure Laterality Date  . cerebral arteriogram    . CESAREAN SECTION  1995  . CRANIOPLASTY Right 07/09/2012   Procedure: CRANIOPLASTY;  Surgeon: Carmela Hurt, MD;  Location: [facility];  Service: Neurosurgery;  Laterality: Right;  Cranioplasty with bone retrieval from abdominal pocket  . 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 PLACEMENT OF BONE FLAB IN ABDOMINAL WALL;  Surgeon: Carmela Hurt, MD;  Location: [facility];  Service: Neurosurgery;  Laterality: Right;  RIGHT Craniectomy for arteriovenous malformation resection  . DILATION AND CURETTAGE OF UTERUS  06/07/2002  . EYE SURGERY  01/12/2013   laser   . IR GENERIC HISTORICAL  02/12/2016   IR ANGIO INTRA EXTRACRAN SEL COM CAROTID INNOMINATE BILAT MOD SED 02/12/2016 Julieanne Cotton, MD [facility]  . IR GENERIC HISTORICAL  02/12/2016   IR ANGIO VERTEBRAL SEL SUBCLAVIAN INNOMINATE UNI L MOD SED  02/12/2016 Julieanne Cotton, MD [facility]  . IR GENERIC HISTORICAL  02/12/2016   IR ANGIO VERTEBRAL SEL VERTEBRAL UNI R MOD SED 02/12/2016 Julieanne Cotton, MD [facility]  . RADIOLOGY WITH ANESTHESIA N/A 04/20/2012   Procedure: RADIOLOGY WITH ANESTHESIA;  Surgeon: Oneal Grout, MD;  Location: [facility];  Service: Radiology;  Laterality: N/A;  . RADIOLOGY WITH ANESTHESIA N/A 05/26/2012   Procedure: RADIOLOGY WITH ANESTHESIA;  Surgeon: Oneal Grout, MD;  Location: [facility];  Service: Radiology;  Laterality: N/A;   Social History   Socioeconomic History  . Marital status: Married    Spouse name: Not on file  . Number of children: 2  . Years of education: Not on file  . Highest education level: Not on file  Occupational History  . 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 Financial resource strain: Not on file  . Food insecurity:    Worry: Not on file    Inability: Not on file  . Transportation needs:    Medical: Not on  file    Non-medical: Not on file  Tobacco Use  . Smoking status: Former Smoker    Packs/day: 1.50    Years: 26.00    Pack years: 39.00    Types: Cigarettes    Last attempt to quit: 07/08/2003    Years since quitting: 14.2  . Smokeless tobacco: Never Used  . Tobacco comment: quit smoking in 2005  Substance and Sexual Activity  . Alcohol use: No  . Drug use: No  . Sexual activity: Not Currently  Lifestyle  . Physical activity:    Days per week: Not on file    Minutes per session: Not on file  . Stress: Not on file  Relationships  . Social connections:    Talks on phone: Not on file    Gets together: Not on file    Attends religious service: Not on file    Active member of club or organization: Not on file    Attends meetings of clubs or organizations: Not on file    Relationship status: Not on file  Other Topics Concern  . Not on file  Social History Narrative   Caffeine Use-no         Outpatient  Encounter Medications as of 09/28/2017  Medication Sig  . albuterol (PROVENTIL HFA;VENTOLIN HFA) 108 (90 BASE) MCG/ACT inhaler Inhale 2 puffs into the lungs every 6 (six) hours as needed for wheezing.  . cetirizine (ZYRTEC) 10 MG tablet Take 10 mg by mouth daily.  . DULoxetine (CYMBALTA) 60 MG capsule Take 60 mg by mouth daily.  Marland Kitchen escitalopram (LEXAPRO) 20 MG tablet Take 20 mg by mouth daily.  Marland Kitchen levothyroxine (SYNTHROID, LEVOTHROID) 75 MCG tablet Take 75 mcg by mouth daily before breakfast.  . lisinopril (PRINIVIL,ZESTRIL) 5 MG tablet Take 5 mg by mouth daily.  Marland Kitchen LORazepam (ATIVAN) 1 MG tablet Take 2 tablets (2 mg total) by mouth 2 (two) times daily.  . metformin (FORTAMET) 1000 MG (OSM) 24 hr tablet Take 1 tablet (1,000 mg total) by mouth daily with breakfast.  . norethindrone (AYGESTIN) 5 MG tablet Take 2.5 mg by mouth daily.  . pregabalin (LYRICA) 150 MG capsule Take 150 mg by mouth at bedtime. For nerve pain   . Probiotic Product (PROBIOTIC DAILY PO) Take 1 capsule by mouth daily.  Marland Kitchen Propylene Glycol (SYSTANE BALANCE OP) Place 3 drops into both eyes daily.  Marland Kitchen topiramate (TOPAMAX) 50 MG tablet Take 1 tablet (50 mg total) by mouth daily as needed. For migraines (Patient taking differently: Take 50 mg by mouth at bedtime. For migraines)  . [DISCONTINUED] metFORMIN (FORTAMET) 1000 MG (OSM) 24 hr tablet Take 1 tablet (1,000 mg total) by mouth daily with breakfast. (Patient taking differently: Take 1,000 mg by mouth 2 (two) times daily with a meal. )   Facility-Administered Encounter Medications as of 09/28/2017  Medication  . indocyanine green (IC-GREEN) injection 75 mg    ALLERGIES: Allergies  Allergen Reactions  . Cleocin [Clindamycin Hcl] Anaphylaxis  . Latex Swelling    "SEVERE REACTION"  When received flu shot.    VACCINATION STATUS: Immunization History  Administered Date(s) Administered  . Pneumococcal Polysaccharide-23 05/28/2012    Diabetes  She presents for her initial  diabetic visit. She has type 2 diabetes mellitus. Her disease course has been worsening. There are no hypoglycemic associated symptoms. Pertinent negatives for hypoglycemia include no confusion, headaches, pallor or seizures. Associated symptoms include fatigue, polydipsia and polyuria. Pertinent negatives for diabetes include no chest pain and no polyphagia.  There are no hypoglycemic complications. Symptoms are worsening. There are no diabetic complications. Risk factors for coronary artery disease include diabetes mellitus, dyslipidemia, obesity, sedentary lifestyle and tobacco exposure. Current diabetic treatments: She is currently taking metformin ER 1000 mg p.o. twice daily. Her weight is decreasing steadily. She is following a generally unhealthy diet. When asked about meal planning, she reported none. She has had a previous visit with a dietitian. She never participates in exercise. Her breakfast blood glucose range is generally 180-200 mg/dl. Her bedtime blood glucose range is generally >200 mg/dl. Her overall blood glucose range is >200 mg/dl. An ACE inhibitor/angiotensin II receptor blocker is being taken.  Hypertension  This is a chronic problem. The current episode started more than 1 year ago. The problem is controlled. Pertinent negatives include no chest pain, headaches, palpitations or shortness of breath. Risk factors for coronary artery disease include diabetes mellitus, obesity, sedentary lifestyle and smoking/tobacco exposure. Past treatments include ACE inhibitors.      Review of Systems  Constitutional: Positive for fatigue. Negative for chills, fever and unexpected weight change.  HENT: Negative for trouble swallowing and voice change.   Eyes: Negative for visual disturbance.  Respiratory: Negative for cough, shortness of breath and wheezing.   Cardiovascular: Negative for chest pain, palpitations and leg swelling.  Gastrointestinal: Negative for diarrhea, nausea and vomiting.   Endocrine: Positive for polydipsia and polyuria. Negative for cold intolerance, heat intolerance and polyphagia.  Musculoskeletal: Negative for arthralgias and myalgias.  Skin: Negative for color change, pallor, rash and wound.  Neurological: Negative for seizures and headaches.  Psychiatric/Behavioral: Negative for confusion and suicidal ideas.    Objective:    BP 112/76 (BP Location: Right Arm, Patient Position: Sitting)   Pulse 98   Ht 5\' 6"  (1.676 m)   Wt 288 lb 9.6 oz (130.9 kg)   SpO2 95%   BMI 46.58 kg/m   Wt Readings from Last 3 Encounters:  09/28/17 288 lb 9.6 oz (130.9 kg)  08/19/17 290 lb (131.5 kg)  02/12/16 296 lb (134.3 kg)     Physical Exam  Constitutional: She is oriented to person, place, and time. She appears well-developed.  HENT:  Head: Normocephalic and atraumatic.  Eyes: EOM are normal.  Neck: Normal range of motion. Neck supple. No tracheal deviation present. No thyromegaly present.  Cardiovascular: Normal rate and regular rhythm.  Pulmonary/Chest: Effort normal and breath sounds normal.  Abdominal: Soft. Bowel sounds are normal. There is no tenderness. There is no guarding.  Musculoskeletal: Normal range of motion. She exhibits no edema.  Neurological: She is alert and oriented to person, place, and time. She has normal reflexes. No cranial nerve deficit. Coordination normal.  Skin: Skin is warm and dry. No rash noted. No erythema. No pallor.  Psychiatric: She has a normal mood and affect. Judgment normal.    CMP     Component Value Date/Time   NA 138 02/12/2016 0645   K 3.9 02/12/2016 0645   CL 107 02/12/2016 0645   CO2 20 (L) 02/12/2016 0645   GLUCOSE 163 (H) 02/12/2016 0645   BUN 10 02/12/2016 0645   CREATININE 0.85 02/12/2016 0645   CALCIUM 9.0 02/12/2016 0645   PROT 6.5 07/19/2012 0555   ALBUMIN 3.0 (L) 07/19/2012 0555   AST 8 07/19/2012 0555   ALT 13 07/19/2012 0555   ALKPHOS 92 07/19/2012 0555   BILITOT 0.4 07/19/2012 0555    GFRNONAA >60 02/12/2016 0645   GFRAA >60 02/12/2016 0645  Diabetic Labs (most recent): Lab Results  Component Value Date   HGBA1C 10 06/24/2017   HGBA1C 6.6 (H) 07/12/2012   HGBA1C 7.5 (H) 08/22/2011     Lipid Panel ( most recent) Lipid Panel     Component Value Date/Time   CHOL  11/24/2009 0634    143        ATP III CLASSIFICATION:  <200     mg/dL   Desirable  161-096  mg/dL   Borderline High  >=045    mg/dL   High          TRIG 409 11/24/2009 0634   HDL 35 (L) 11/24/2009 0634   CHOLHDL 4.1 11/24/2009 0634   VLDL 22 11/24/2009 0634   LDLCALC  11/24/2009 0634    86        Total Cholesterol/HDL:CHD Risk Coronary Heart Disease Risk Table                     Men   Women  1/2 Average Risk   3.4   3.3  Average Risk       5.0   4.4  2 X Average Risk   9.6   7.1  3 X Average Risk  23.4   11.0        Use the calculated Patient Ratio above and the CHD Risk Table to determine the patient's CHD Risk.        ATP III CLASSIFICATION (LDL):  <100     mg/dL   Optimal  811-914  mg/dL   Near or Above                    Optimal  130-159  mg/dL   Borderline  782-956  mg/dL   High  >213     mg/dL   Very High      Lab Results  Component Value Date   TSH 1.65 08/22/2011   TSH 4.678 (H) 11/24/2009      Assessment & Plan:   1. Uncontrolled type 2 diabetes mellitus with hyperglycemia (HCC)  - Michaela V Agnes has currently uncontrolled symptomatic type 2 DM since 47 years of age,  with most recent A1c of 10 %. Recent labs reviewed.  -her diabetes is complicated by obesity/sedentary life and Jilda V Olsson remains at a high risk for more acute and chronic complications which include CAD, CVA, CKD, retinopathy, and neuropathy. These are all discussed in detail with the patient.  - I have counseled her on diet management and weight loss, by adopting a carbohydrate restricted/protein rich diet.  - Suggestion is made for her to avoid simple carbohydrates  from her diet  including Cakes, Sweet Desserts, Ice Cream, Soda (diet and regular), Sweet Tea, Candies, Chips, Cookies, Store Bought Juices, Alcohol in Excess of  1-2 drinks a day, Artificial Sweeteners, and "Sugar-free" Products. This will help patient to have stable blood glucose profile and potentially avoid unintended weight gain.  - I encouraged her to switch to  unprocessed or minimally processed complex starch and increased protein intake (animal or plant source), fruits, and vegetables.  - she is advised to stick to a routine mealtimes to eat 3 meals  a day and avoid unnecessary snacks ( to snack only to correct hypoglycemia).   - I have approached her with the following individualized plan to manage diabetes and patient agrees:   -Given her current glycemic burden, she may need insulin treatment in order for her to achieve control  of diabetes to target.   -However, after her visit with Norm Salt , CDE recently, she made significant change in her diet and lost 8 pounds and documented blood glucose readings averaging 160 - 200 mg/dL.   -I will proceed to obtain new set of labs including kidney function, thyroid function test, and A1c.   -In the meantime, I advised her to lower her metformin ER to 1000 mg p.o. daily after breakfast.    -Patient is encouraged to call clinic for blood glucose levels less than 70 or above 300 mg /dl. -She will be considered for GLP-1 receptor agonist weekly injection if she loses control.  - Patient specific target  A1c;  LDL, HDL, Triglycerides, and  Waist Circumference were discussed in detail.  2) BP/HTN: Her blood pressure is controlled to target.  She is advised to continue her current medications including lisinopril 5 mg p.o. daily.    3) Lipids/HPL:   Her recent lipid panel showed LDL at 88.  She is not on any statin medications.   4)  Weight/Diet: CDE consult is in progress  , exercise, and detailed carbohydrates information provided.   5)  hypothyroidism  -She is on levothyroxine 75 mcg p.o. daily before breakfast .  She has no recent thyroid function test to review. She will continue on the same dose for now.  She will be sent for thyroid function test along with her other labs today.  - We discussed about correct intake of levothyroxine, at fasting, with water, separated by at least 30 minutes from breakfast, and separated by more than 4 hours from calcium, iron, multivitamins, acid reflux medications (PPIs). -Patient is made aware of the fact that thyroid hormone replacement is needed for life, dose to be adjusted by periodic monitoring of thyroid function tests.   6) Chronic Care/Health Maintenance:  -she  is on ACEI and  is encouraged to initiate and continue to follow up with Ophthalmology, Dentist,  Podiatrist at least yearly or according to recommendations, and advised to  stay away from smoking. I have recommended yearly flu vaccine and pneumonia vaccine at least every 5 years; moderate intensity exercise for up to 150 minutes weekly; and  sleep for at least 7 hours a day.  - I advised patient to maintain close follow up with Richardean Chimera, MD for primary care needs.  - Time spent with the patient: 45 minutes, of which >50% was spent in obtaining information about her symptoms, reviewing her previous labs, evaluations, and treatments, counseling her about her currently uncontrolled type 2 diabetes, hypothyroidism, morbid obesity, hypertension, and developing developing  plans for long term treatment based on the latest recommendations.  Curlene Dolphin Garza participated in the discussions, expressed understanding, and voiced agreement with the above plans.  All questions were answered to her satisfaction. she is encouraged to contact clinic should she have any questions or concerns prior to her return visit.  Follow up plan: - Return in about 2 weeks (around 10/12/2017) for meter, and logs, labs today, follow up with pre-visit  labs, meter, and logs.  Marquis Lunch, MD [facility] [address] [place], [place] [address] Phone: [phone]  Fax: [phone]    09/28/2017, 4:46 PM  This note was partially dictated with voice recognition software. Similar sounding words can be transcribed inadequately or may not  be corrected upon review.

## 2017-09-29 ENCOUNTER — Other Ambulatory Visit: Payer: Self-pay | Admitting: "Endocrinology

## 2017-09-29 LAB — HEMOGLOBIN A1C
Hgb A1c MFr Bld: 7.7 % of total Hgb — ABNORMAL HIGH (ref <5.7)
Mean Plasma Glucose: 174 (calc)
eAG (mmol/L): 9.7 (calc)

## 2017-09-29 LAB — COMPLETE METABOLIC PANEL WITH GFR
AG Ratio: 1.5 (calc) (ref 1.0–2.5)
ALT: 11 U/L (ref 6–29)
AST: 10 U/L (ref 10–35)
Albumin: 3.8 g/dL (ref 3.6–5.1)
Alkaline phosphatase (APISO): 116 U/L — ABNORMAL HIGH (ref 33–115)
BILIRUBIN TOTAL: 0.6 mg/dL (ref 0.2–1.2)
BUN: 12 mg/dL (ref 7–25)
CHLORIDE: 103 mmol/L (ref 98–110)
CO2: 27 mmol/L (ref 20–32)
Calcium: 9 mg/dL (ref 8.6–10.2)
Creat: 0.91 mg/dL (ref 0.50–1.10)
GFR, Est African American: 86 mL/min/{1.73_m2} (ref >=60)
GFR, Est Non African American: 75 mL/min/{1.73_m2} (ref >=60)
GLOBULIN: 2.6 g/dL (ref 1.9–3.7)
Glucose, Bld: 232 mg/dL — ABNORMAL HIGH (ref 65–139)
POTASSIUM: 4.2 mmol/L (ref 3.5–5.3)
SODIUM: 139 mmol/L (ref 135–146)
Total Protein: 6.4 g/dL (ref 6.1–8.1)

## 2017-09-29 LAB — MICROALBUMIN / CREATININE URINE RATIO
CREATININE, URINE: 80 mg/dL (ref 20–275)
MICROALB UR: 0.2 mg/dL
MICROALB/CREAT RATIO: 3 ug/mg{creat} (ref <30)

## 2017-09-29 LAB — T4, FREE: FREE T4: 1.1 ng/dL (ref 0.8–1.8)

## 2017-09-29 LAB — TSH: TSH: 0.88 m[IU]/L

## 2017-10-14 ENCOUNTER — Ambulatory Visit (INDEPENDENT_AMBULATORY_CARE_PROVIDER_SITE_OTHER): Payer: 59 | Admitting: "Endocrinology

## 2017-10-14 ENCOUNTER — Encounter: Payer: 59 | Attending: Family Medicine | Admitting: Nutrition

## 2017-10-14 ENCOUNTER — Encounter: Payer: Self-pay | Admitting: "Endocrinology

## 2017-10-14 ENCOUNTER — Encounter: Payer: Self-pay | Admitting: Nutrition

## 2017-10-14 VITALS — Ht 67.0 in | Wt 281.0 lb

## 2017-10-14 VITALS — BP 129/89 | HR 94 | Ht 66.0 in | Wt 281.0 lb

## 2017-10-14 DIAGNOSIS — I10 Essential (primary) hypertension: Secondary | ICD-10-CM | POA: Diagnosis not present

## 2017-10-14 DIAGNOSIS — E039 Hypothyroidism, unspecified: Secondary | ICD-10-CM | POA: Diagnosis not present

## 2017-10-14 DIAGNOSIS — E118 Type 2 diabetes mellitus with unspecified complications: Secondary | ICD-10-CM

## 2017-10-14 DIAGNOSIS — Z6841 Body Mass Index (BMI) 40.0 and over, adult: Secondary | ICD-10-CM | POA: Insufficient documentation

## 2017-10-14 DIAGNOSIS — E1165 Type 2 diabetes mellitus with hyperglycemia: Secondary | ICD-10-CM | POA: Diagnosis not present

## 2017-10-14 DIAGNOSIS — E669 Obesity, unspecified: Secondary | ICD-10-CM | POA: Diagnosis not present

## 2017-10-14 DIAGNOSIS — E119 Type 2 diabetes mellitus without complications: Secondary | ICD-10-CM | POA: Diagnosis not present

## 2017-10-14 DIAGNOSIS — Z713 Dietary counseling and surveillance: Secondary | ICD-10-CM | POA: Diagnosis present

## 2017-10-14 DIAGNOSIS — IMO0002 Reserved for concepts with insufficient information to code with codable children: Secondary | ICD-10-CM

## 2017-10-14 NOTE — Progress Notes (Signed)
Endocrinology Consult Note       10/14/2017, 4:58 PM   Subjective:    Patient ID: Olivia Payne, female    DOB: [birth year].  Olivia Payne is being seen in consultation for management of currently uncontrolled symptomatic diabetes requested by  Richardean Chimera, MD.   Past Medical History:  Diagnosis Date  . Allergy   . Anxiety   . Asthma   . 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 Has had multiple procedures.   . Cerebral aneurysm   . Cerebral AVM   . Depression   . Diabetes mellitus with other specified manifestations    peripheral neuropathy  . Facial pain    since intubation  . GERD (gastroesophageal reflux disease)    hx  . History of chicken pox   . Hypertension   . Hypothyroidism   . Migraine   . Neuropathy   . OA (osteoarthritis) of knee    left knee with chronic pain  . Sleep apnea    does not use Cpap  . Thyroid disease    Past Surgical History:  Procedure Laterality Date  . cerebral arteriogram    . CESAREAN SECTION  1995  . CRANIOPLASTY Right 07/09/2012   Procedure: CRANIOPLASTY;  Surgeon: Carmela Hurt, MD;  Location: [facility];  Service: Neurosurgery;  Laterality: Right;  Cranioplasty with bone retrieval from abdominal pocket  . 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 PLACEMENT OF BONE FLAB IN ABDOMINAL WALL;  Surgeon: Carmela Hurt, MD;  Location: [facility];  Service: Neurosurgery;  Laterality: Right;  RIGHT Craniectomy for arteriovenous malformation resection  . DILATION AND CURETTAGE OF UTERUS  06/07/2002  . EYE SURGERY  01/12/2013   laser   . IR GENERIC HISTORICAL  02/12/2016   IR ANGIO INTRA EXTRACRAN SEL COM CAROTID INNOMINATE BILAT MOD SED 02/12/2016 Julieanne Cotton, MD [facility]  . IR GENERIC HISTORICAL  02/12/2016   IR ANGIO VERTEBRAL SEL SUBCLAVIAN INNOMINATE UNI L MOD SED  02/12/2016 Julieanne Cotton, MD [facility]  . IR GENERIC HISTORICAL  02/12/2016   IR ANGIO VERTEBRAL SEL VERTEBRAL UNI R MOD SED 02/12/2016 Julieanne Cotton, MD [facility]  . RADIOLOGY WITH ANESTHESIA N/A 04/20/2012   Procedure: RADIOLOGY WITH ANESTHESIA;  Surgeon: Oneal Grout, MD;  Location: [facility];  Service: Radiology;  Laterality: N/A;  . RADIOLOGY WITH ANESTHESIA N/A 05/26/2012   Procedure: RADIOLOGY WITH ANESTHESIA;  Surgeon: Oneal Grout, MD;  Location: [facility];  Service: Radiology;  Laterality: N/A;   Social History   Socioeconomic History  . Marital status: Married    Spouse name: Not on file  . Number of children: 2  . Years of education: Not on file  . Highest education level: Not on file  Occupational History  . 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 Financial resource strain: Not on file  . Food insecurity:    Worry: Not on file    Inability: Not on file  . Transportation needs:    Medical: Not on  file    Non-medical: Not on file  Tobacco Use  . Smoking status: Former Smoker    Packs/day: 1.50    Years: 26.00    Pack years: 39.00    Types: Cigarettes    Last attempt to quit: 07/08/2003    Years since quitting: 14.2  . Smokeless tobacco: Never Used  . Tobacco comment: quit smoking in 2005  Substance and Sexual Activity  . Alcohol use: No  . Drug use: No  . Sexual activity: Not Currently  Lifestyle  . Physical activity:    Days per week: Not on file    Minutes per session: Not on file  . Stress: Not on file  Relationships  . Social connections:    Talks on phone: Not on file    Gets together: Not on file    Attends religious service: Not on file    Active member of club or organization: Not on file    Attends meetings of clubs or organizations: Not on file    Relationship status: Not on file  Other Topics Concern  . Not on file  Social History Narrative   Caffeine Use-no         Outpatient  Encounter Medications as of 10/14/2017  Medication Sig  . albuterol (PROVENTIL HFA;VENTOLIN HFA) 108 (90 BASE) MCG/ACT inhaler Inhale 2 puffs into the lungs every 6 (six) hours as needed for wheezing.  . cetirizine (ZYRTEC) 10 MG tablet Take 10 mg by mouth daily.  . DULoxetine (CYMBALTA) 60 MG capsule Take 60 mg by mouth daily.  Marland Kitchen. escitalopram (LEXAPRO) 20 MG tablet Take 20 mg by mouth daily.  Marland Kitchen. levothyroxine (SYNTHROID, LEVOTHROID) 75 MCG tablet Take 75 mcg by mouth daily before breakfast.  . lisinopril (PRINIVIL,ZESTRIL) 5 MG tablet Take 5 mg by mouth daily.  Marland Kitchen. LORazepam (ATIVAN) 1 MG tablet Take 2 tablets (2 mg total) by mouth 2 (two) times daily.  . metformin (FORTAMET) 1000 MG (OSM) 24 hr tablet Take 1 tablet (1,000 mg total) by mouth daily with breakfast.  . norethindrone (AYGESTIN) 5 MG tablet Take 2.5 mg by mouth daily.  . pregabalin (LYRICA) 150 MG capsule Take 150 mg by mouth at bedtime. For nerve pain   . Probiotic Product (PROBIOTIC DAILY PO) Take 1 capsule by mouth daily.  Marland Kitchen. Propylene Glycol (SYSTANE BALANCE OP) Place 3 drops into both eyes daily.  Marland Kitchen. topiramate (TOPAMAX) 50 MG tablet Take 1 tablet (50 mg total) by mouth daily as needed. For migraines (Patient taking differently: Take 50 mg by mouth at bedtime. For migraines)   Facility-Administered Encounter Medications as of 10/14/2017  Medication  . indocyanine green (IC-GREEN) injection 75 mg    ALLERGIES: Allergies  Allergen Reactions  . Cleocin [Clindamycin Hcl] Anaphylaxis  . Latex Swelling    "SEVERE REACTION"  When received flu shot.    VACCINATION STATUS: Immunization History  Administered Date(s) Administered  . Pneumococcal Polysaccharide-23 05/28/2012    Diabetes  She presents for her follow-up diabetic visit. She has type 2 diabetes mellitus. Her disease course has been improving. There are no hypoglycemic associated symptoms. Pertinent negatives for hypoglycemia include no confusion, headaches, pallor or  seizures. Associated symptoms include polydipsia and polyuria. Pertinent negatives for diabetes include no chest pain, no fatigue and no polyphagia. There are no hypoglycemic complications. Symptoms are improving. There are no diabetic complications. Risk factors for coronary artery disease include diabetes mellitus, dyslipidemia, obesity, sedentary lifestyle and tobacco exposure. Current diabetic treatments: She is currently taking metformin  ER 1000 mg p.o. twice daily. Her weight is decreasing steadily. She is following a generally unhealthy diet. When asked about meal planning, she reported none. She has had a previous visit with a dietitian. She never participates in exercise. Her breakfast blood glucose range is generally 140-180 mg/dl. Her bedtime blood glucose range is generally 180-200 mg/dl. Her overall blood glucose range is 140-180 mg/dl. An ACE inhibitor/angiotensin II receptor blocker is being taken.  Hypertension  This is a chronic problem. The current episode started more than 1 year ago. The problem is controlled. Pertinent negatives include no chest pain, headaches, palpitations or shortness of breath. Risk factors for coronary artery disease include diabetes mellitus, obesity, sedentary lifestyle and smoking/tobacco exposure. Past treatments include ACE inhibitors.     Review of Systems  Constitutional: Negative for chills, fatigue, fever and unexpected weight change.  HENT: Negative for trouble swallowing and voice change.   Eyes: Negative for visual disturbance.  Respiratory: Negative for cough, shortness of breath and wheezing.   Cardiovascular: Negative for chest pain, palpitations and leg swelling.  Gastrointestinal: Negative for diarrhea, nausea and vomiting.  Endocrine: Positive for polydipsia and polyuria. Negative for cold intolerance, heat intolerance and polyphagia.  Musculoskeletal: Negative for arthralgias and myalgias.  Skin: Negative for color change, pallor, rash and  wound.  Neurological: Negative for seizures and headaches.  Psychiatric/Behavioral: Negative for confusion and suicidal ideas.    Objective:    BP 129/89   Pulse 94   Ht 5\' 6"  (1.676 m)   Wt 281 lb (127.5 kg)   BMI 45.35 kg/m   Wt Readings from Last 3 Encounters:  10/14/17 281 lb (127.5 kg)  10/14/17 281 lb (127.5 kg)  09/28/17 288 lb 9.6 oz (130.9 kg)     Physical Exam  Constitutional: She is oriented to person, place, and time. She appears well-developed.  HENT:  Head: Normocephalic and atraumatic.  Eyes: EOM are normal.  Neck: Normal range of motion. Neck supple. No tracheal deviation present. No thyromegaly present.  Cardiovascular: Normal rate.  Pulmonary/Chest: Effort normal.  Abdominal: There is no tenderness. There is no guarding.  Musculoskeletal: Normal range of motion. She exhibits no edema.  Neurological: She is alert and oriented to person, place, and time. She has normal reflexes. No cranial nerve deficit. Coordination normal.  Skin: Skin is warm and dry. No rash noted. No erythema. No pallor.  Psychiatric: She has a normal mood and affect. Judgment normal.    CMP     Component Value Date/Time   NA 139 09/28/2017 1624   K 4.2 09/28/2017 1624   CL 103 09/28/2017 1624   CO2 27 09/28/2017 1624   GLUCOSE 232 (H) 09/28/2017 1624   BUN 12 09/28/2017 1624   CREATININE 0.91 09/28/2017 1624   CALCIUM 9.0 09/28/2017 1624   PROT 6.4 09/28/2017 1624   ALBUMIN 3.0 (L) 07/19/2012 0555   AST 10 09/28/2017 1624   ALT 11 09/28/2017 1624   ALKPHOS 92 07/19/2012 0555   BILITOT 0.6 09/28/2017 1624   GFRNONAA 75 09/28/2017 1624   GFRAA 86 09/28/2017 1624     Diabetic Labs (most recent): Lab Results  Component Value Date   HGBA1C 7.7 (H) 09/28/2017   HGBA1C 10 06/24/2017   HGBA1C 6.6 (H) 07/12/2012     Lipid Panel ( most recent) Lipid Panel     Component Value Date/Time   CHOL  11/24/2009 0634    143        ATP III CLASSIFICATION:  <200  mg/dL    Desirable  [id number]  mg/dL   Borderline High  >=952>=240    mg/dL   High          TRIG [id number] 11/24/2009 0634   HDL 35 (L) 11/24/2009 0634   CHOLHDL 4.1 11/24/2009 0634   VLDL 22 11/24/2009 0634   LDLCALC  11/24/2009 0634    86        Total Cholesterol/HDL:CHD Risk Coronary Heart Disease Risk Table                     Men   Women  1/2 Average Risk   3.4   3.3  Average Risk       5.0   4.4  2 X Average Risk   9.6   7.1  3 X Average Risk  23.4   11.0        Use the calculated Patient Ratio above and the CHD Risk Table to determine the patient's CHD Risk.        ATP III CLASSIFICATION (LDL):  <100     mg/dL   Optimal  [id number]  mg/dL   Near or Above                    Optimal  130-159  mg/dL   Borderline  [id number]  mg/dL   High  >664>190     mg/dL   Very High      Lab Results  Component Value Date   TSH 0.88 09/28/2017   TSH 1.65 08/22/2011   TSH 4.678 (H) 11/24/2009   FREET4 1.1 09/28/2017      Assessment & Plan:   1. Uncontrolled type 2 diabetes mellitus with hyperglycemia (HCC)  - Olivia Payne has currently uncontrolled symptomatic type 2 DM since 47 years of age. -Her recent A1c was 7.7% improving from 10%. -Her labs are reviewed with her.  -her diabetes is complicated by obesity/sedentary life and Olivia Payne remains at a high risk for more acute and chronic complications which include CAD, CVA, CKD, retinopathy, and neuropathy. These are all discussed in detail with the patient.  - I have counseled her on diet management and weight loss, by adopting a carbohydrate restricted/protein rich diet.  -  Suggestion is made for her to avoid simple carbohydrates  from her diet including Cakes, Sweet Desserts / Pastries, Ice Cream, Soda (diet and regular), Sweet Tea, Candies, Chips, Cookies, Store Bought Juices, Alcohol in Excess of  1-2 drinks a day, Artificial Sweeteners, and "Sugar-free" Products. This will help patient to have stable blood glucose profile and potentially avoid  unintended weight gain.   - I encouraged her to switch to  unprocessed or minimally processed complex starch and increased protein intake (animal or plant source), fruits, and vegetables.  - she is advised to stick to a routine mealtimes to eat 3 meals  a day and avoid unnecessary snacks ( to snack only to correct hypoglycemia).   - I have approached her with the following individualized plan to manage diabetes and patient agrees:   -Based on her presentation with average blood glucose of 175 over the last 14 days and A1c of 7.7%, she will not be considered for insulin treatment at this time.    -She recently made significant change in her diet and continued to lose weight, total of 16 pounds over the last 2 months.  -Her labs are consistent with normal kidney function and normal thyroid function. -I advised  her to continue metformin 1000 mg ER daily after breakfast.  -Patient is encouraged to call clinic for blood glucose levels less than 70 or above 300 mg /dl. -She will be considered for GLP-1 receptor agonist weekly injection if she loses control.  - Patient specific target  A1c;  LDL, HDL, Triglycerides, and  Waist Circumference were discussed in detail.  2) BP/HTN: Her blood pressure is controlled to target.  She is advised to continue her medications including lisinopril 5 mg p.o. daily.    3) Lipids/HPL:   Her recent lipid panel showed LDL at 88.  She is not on any statin medications.   4)  Weight/Diet: CDE consult is in progress  , exercise, and detailed carbohydrates information provided.   5) hypothyroidism  -Her previsit thyroid function tests are consistent with appropriate replacement.  I advised her to continue levothyroxine 75 mcg p.o. daily before breakfast.  - We discussed about correct intake of levothyroxine, at fasting, with water, separated by at least 30 minutes from breakfast, and separated by more than 4 hours from calcium, iron, multivitamins, acid reflux  medications (PPIs). -Patient is made aware of the fact that thyroid hormone replacement is needed for life, dose to be adjusted by periodic monitoring of thyroid function tests.   6) Chronic Care/Health Maintenance:  -she  is on ACEI and  is encouraged to initiate and continue to follow up with Ophthalmology, Dentist,  Podiatrist at least yearly or according to recommendations, and advised to  stay away from smoking. I have recommended yearly flu vaccine and pneumonia vaccine at least every 5 years; moderate intensity exercise for up to 150 minutes weekly; and  sleep for at least 7 hours a day.  - I advised patient to maintain close follow up with Richardean Chimera, MD for primary care needs.  - Time spent with the patient: 25 min, of which >50% was spent in reviewing her blood glucose logs , discussing her hypo- and hyper-glycemic episodes, reviewing her current and  previous labs and insulin doses and developing a plan to avoid hypo- and hyper-glycemia. Please refer to Patient Instructions for Blood Glucose Monitoring and Insulin/Medications Dosing Guide"  in media tab for additional information. Olivia Payne participated in the discussions, expressed understanding, and voiced agreement with the above plans.  All questions were answered to her satisfaction. she is encouraged to contact clinic should she have any questions or concerns prior to her return visit.   Follow up plan: - Return in about 4 months (around 02/13/2018) for Follow up with Pre-visit Labs.  Marquis Lunch, MD [facility] [address] [place], [place] [address] Phone: [phone]  Fax: [phone]    10/14/2017, 4:58 PM  This note was partially dictated with voice recognition software. Similar sounding words can be transcribed inadequately or may not  be corrected upon review.

## 2017-10-14 NOTE — Patient Instructions (Signed)

## 2017-10-14 NOTE — Progress Notes (Signed)
  Medical Nutrition Therapy:  Appt start time:  1530 end time:  1600 Assessment:  Primary concerns today: Diabetes Type 2 and Obesity. Lost 8 lbs.  Just got back from [place] and did a lot of walking.Eating more baekd and grilled foods. Metfomin 1000 mg once a day. Making progress. Her daughter is helping with meals.  A1C down to 7.7% from > 10%.  Lab Results  Component Value Date   HGBA1C 7.7 (H) 09/28/2017   Preferred Learning Style:  No preference indicated   Learning Readiness:     Ready  Change in progress   MEDICATIONS: See list   DIETARY INTAKE: 24-hr recall:  B ( AM): egg, Oatmeal, blueberries. [place] bacon, and milk Snk ( AM):  L ( PM): skipped or tuekey sandwich,  Snk ( PM):  D ( PM):  Baked spaghetti,   Dt. Pepper Snk ( PM):  Beverages: water.  Usual physical activity walking  Estimated energy needs: 1500 calories 170 g carbohydrates 112 g protein 42 g fat  Progress Towards Goal(s):  In progress.   Nutritional Diagnosis:  NB-1.1 Food and nutrition-related knowledge deficit As related to Diabetes .  As evidenced by A1C 13%..    Intervention:  Nutrition and Diabetes education provided on My Plate, CHO counting, meal planning, portion sizes, timing of meals, avoiding snacks between meals unless having a low blood sugar, target ranges for A1C and blood sugars, signs/symptoms and treatment of hyper/hypoglycemia, monitoring blood sugars, taking medications as prescribed, benefits of exercising 30 minutes per day and prevention of complications of DM.   Goals 1. Don't skip breakfast 2. Continue to walk daily. 3. Keep drinking water 4. Lose 1 lb per week.  .Teaching Method Utilized:  Visual Auditory Hands on  Handouts given during visit include:  The Plate Method   Meal Plan Card   Barriers to learning/adherence to lifestyle change: none  Demonstrated degree of understanding via:  Teach Back   Monitoring/Evaluation:  Dietary intake, exercise,  meal planning, and body weight in 1 month(s).

## 2017-10-14 NOTE — Patient Instructions (Addendum)
Goals 1. Don't skip breakfast 2. Continue to walk daily. 3. Keep drinking water 4. Lose 1 lb per week.

## 2017-10-19 ENCOUNTER — Other Ambulatory Visit: Payer: Self-pay

## 2017-10-19 ENCOUNTER — Telehealth: Payer: Self-pay | Admitting: "Endocrinology

## 2017-10-19 MED ORDER — METFORMIN HCL ER 500 MG PO TB24: 1000.0000 mg | ORAL_TABLET | Freq: Every day | ORAL | 2 refills | Status: DC

## 2017-10-19 NOTE — Telephone Encounter (Signed)
rx sent

## 2017-10-19 NOTE — Telephone Encounter (Signed)
Olivia Payne is calling stating that she needs a Rx sent to [facility] for the Metformin Extended Release, she states that the Metformin she was taking is not the extended release, please advise?

## 2017-10-21 ENCOUNTER — Other Ambulatory Visit: Payer: Self-pay

## 2017-10-21 MED ORDER — METFORMIN HCL ER 500 MG PO TB24: 1000.0000 mg | ORAL_TABLET | Freq: Every day | ORAL | 0 refills | Status: DC

## 2018-01-13 LAB — T4, FREE: Free T4: 1.1 ng/dL (ref 0.8–1.8)

## 2018-01-13 LAB — COMPLETE METABOLIC PANEL WITH GFR
AG RATIO: 1.3 (calc) (ref 1.0–2.5)
ALBUMIN MSPROF: 3.5 g/dL — AB (ref 3.6–5.1)
ALT: 11 U/L (ref 6–29)
AST: 9 U/L — ABNORMAL LOW (ref 10–35)
Alkaline phosphatase (APISO): 132 U/L — ABNORMAL HIGH (ref 33–115)
BILIRUBIN TOTAL: 0.5 mg/dL (ref 0.2–1.2)
BUN: 14 mg/dL (ref 7–25)
CO2: 27 mmol/L (ref 20–32)
Calcium: 8.8 mg/dL (ref 8.6–10.2)
Chloride: 104 mmol/L (ref 98–110)
Creat: 0.78 mg/dL (ref 0.50–1.10)
GFR, EST AFRICAN AMERICAN: 104 mL/min/{1.73_m2} (ref >=60)
GFR, Est Non African American: 90 mL/min/{1.73_m2} (ref >=60)
Globulin: 2.8 g/dL (calc) (ref 1.9–3.7)
Glucose, Bld: 270 mg/dL — ABNORMAL HIGH (ref 65–139)
POTASSIUM: 4.3 mmol/L (ref 3.5–5.3)
Sodium: 138 mmol/L (ref 135–146)
TOTAL PROTEIN: 6.3 g/dL (ref 6.1–8.1)

## 2018-01-13 LAB — VITAMIN D 25 HYDROXY (VIT D DEFICIENCY, FRACTURES): Vit D, 25-Hydroxy: 16 ng/mL — ABNORMAL LOW (ref 30–100)

## 2018-01-13 LAB — HEMOGLOBIN A1C
Hgb A1c MFr Bld: 9.2 % of total Hgb — ABNORMAL HIGH (ref <5.7)
Mean Plasma Glucose: 217 (calc)
eAG (mmol/L): 12 (calc)

## 2018-01-13 LAB — VITAMIN B12: Vitamin B-12: 468 pg/mL (ref 200–1100)

## 2018-01-13 LAB — TSH: TSH: 1.63 m[IU]/L

## 2018-02-15 ENCOUNTER — Encounter: Payer: Self-pay | Admitting: Nutrition

## 2018-02-15 ENCOUNTER — Encounter: Payer: Self-pay | Admitting: "Endocrinology

## 2018-02-15 ENCOUNTER — Telehealth (HOSPITAL_COMMUNITY): Payer: Self-pay

## 2018-02-15 ENCOUNTER — Ambulatory Visit (INDEPENDENT_AMBULATORY_CARE_PROVIDER_SITE_OTHER): Payer: 59 | Admitting: "Endocrinology

## 2018-02-15 ENCOUNTER — Encounter: Payer: 59 | Attending: Family Medicine | Admitting: Nutrition

## 2018-02-15 VITALS — Wt 284.0 lb

## 2018-02-15 VITALS — BP 134/89 | HR 82 | Ht 66.0 in | Wt 284.0 lb

## 2018-02-15 DIAGNOSIS — E1165 Type 2 diabetes mellitus with hyperglycemia: Secondary | ICD-10-CM | POA: Insufficient documentation

## 2018-02-15 DIAGNOSIS — E118 Type 2 diabetes mellitus with unspecified complications: Secondary | ICD-10-CM | POA: Insufficient documentation

## 2018-02-15 DIAGNOSIS — E039 Hypothyroidism, unspecified: Secondary | ICD-10-CM | POA: Diagnosis not present

## 2018-02-15 DIAGNOSIS — E559 Vitamin D deficiency, unspecified: Secondary | ICD-10-CM | POA: Diagnosis not present

## 2018-02-15 DIAGNOSIS — I10 Essential (primary) hypertension: Secondary | ICD-10-CM

## 2018-02-15 MED ORDER — VITAMIN D3 125 MCG (5000 UT) PO CAPS: 5000.0000 [IU] | ORAL_CAPSULE | Freq: Every day | ORAL | 0 refills | Status: DC

## 2018-02-15 MED ORDER — INSULIN DEGLUDEC 100 UNIT/ML ~~LOC~~ SOPN: 20.0000 [IU] | PEN_INJECTOR | Freq: Every day | SUBCUTANEOUS | 2 refills | Status: DC

## 2018-02-15 MED ORDER — INSULIN PEN NEEDLE 31G X 8 MM MISC: 1.0000 | 3 refills | Status: DC

## 2018-02-15 NOTE — Progress Notes (Signed)
  Medical Nutrition Therapy:  Appt start time:  1530 end time:  1600 Assessment:  Primary concerns today: Diabetes Type 2 and Obesity.  Gained 3 lbs.  Saw Dr. Fransico HimNida today. Started Tresiba 20 units a day. Metformin XR 500 mg BID. A1C up from 7.7%. to 9.2%. She has been really stressed at work and at home. New grandbaby was born as a preemie and suppose to come home today. Other granddaughter may have strep throat. Hasn't been able to sleep much. BS poorly controlled.   Lab Results  Component Value Date   HGBA1C 9.2 (H) 01/12/2018   Wt Readings from Last 3 Encounters:  02/15/18 284 lb (128.8 kg)  02/15/18 284 lb (128.8 kg)  10/14/17 281 lb (127.5 kg)   Ht Readings from Last 3 Encounters:  02/15/18 5\' 6"  (1.676 m)  10/14/17 5\' 7"  (1.702 m)  10/14/17 5\' 6"  (1.676 m)   Body mass index is 45.84 kg/m. @BMIFA @ Facility age limit for growth percentiles is 20 years. Facility age limit for growth percentiles is 20 years.  Preferred Learning Style:  No preference indicated   Learning Readiness:     Ready  Change in progress   MEDICATIONS: See list   DIETARY INTAKE: 24-hr recall:  B ( AM): egg, Oatmeal, blueberries. [place] bacon, and milk Snk ( AM):  L ( PM): skipped or tuekey sandwich,  Snk ( PM):  D ( PM):  Baked spaghetti,   Dt. Pepper Snk ( PM):  Beverages: water.  Usual physical activity walking  Estimated energy needs: 1500 calories 170 g carbohydrates 112 g protein 42 g fat  Progress Towards Goal(s):  In progress.   Nutritional Diagnosis:  NB-1.1 Food and nutrition-related knowledge deficit As related to Diabetes .  As evidenced by A1C 13%..    Intervention:  Nutrition and Diabetes education provided on My Plate, CHO counting, meal planning, portion sizes, timing of meals, avoiding snacks between meals unless having a low blood sugar, target ranges for A1C and blood sugars, signs/symptoms and treatment of hyper/hypoglycemia, monitoring blood sugars, taking  medications as prescribed, benefits of exercising 30 minutes per day and prevention of complications of DM.   Goals1 . Don't skip meals   Take 20 units Toujeo every night before going to bed.  Keep drinking water   .Teaching Method Utilized:  Visual Auditory Hands on  Handouts given during visit include:  The Plate Method   Meal Plan Card   Barriers to learning/adherence to lifestyle change: none  Demonstrated degree of understanding via:  Teach Back   Monitoring/Evaluation:  Dietary intake, exercise, meal planning, and body weight in 1 month(s).

## 2018-02-15 NOTE — Telephone Encounter (Signed)
Called to schedule f/u angio. Left message with family for pt to return call. AW

## 2018-02-15 NOTE — Patient Instructions (Signed)
Goals1 . Don't skip meals   Take 20 units Toujeo every night before going to bed.  Keep drinking water

## 2018-02-15 NOTE — Patient Instructions (Signed)

## 2018-02-15 NOTE — Progress Notes (Signed)
Endocrinology follow-up  Note       02/15/2018, 3:36 PM   Subjective:    Patient ID: Olivia Payne, female    DOB: 1970/10/04.  Olivia Payne is being seen in follow-up  for management of currently uncontrolled symptomatic type 2 diabetes, for thyroidism, hypertension. PMD:   Richardean Chimeraaniel, Terry G, MD.   Past Medical History:  Diagnosis Date  . Allergy   . Anxiety   . Asthma   . 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 Has had multiple procedures.   . Cerebral aneurysm   . Cerebral AVM   . Depression   . Diabetes mellitus with other specified manifestations    peripheral neuropathy  . Facial pain    since intubation  . GERD (gastroesophageal reflux disease)    hx  . History of chicken pox   . Hypertension   . Hypothyroidism   . Migraine   . Neuropathy   . OA (osteoarthritis) of knee    left knee with chronic pain  . Sleep apnea    does not use Cpap  . Thyroid disease    Past Surgical History:  Procedure Laterality Date  . cerebral arteriogram    . CESAREAN SECTION  1995  . CRANIOPLASTY Right 07/09/2012   Procedure: CRANIOPLASTY;  Surgeon: Carmela HurtKyle L Cabbell, MD;  Location: [facility];  Service: Neurosurgery;  Laterality: Right;  Cranioplasty with bone retrieval from abdominal pocket  . 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 PLACEMENT OF BONE FLAB IN ABDOMINAL WALL;  Surgeon: Carmela HurtKyle L Cabbell, MD;  Location: [facility];  Service: Neurosurgery;  Laterality: Right;  RIGHT Craniectomy for arteriovenous malformation resection  . DILATION AND CURETTAGE OF UTERUS  06/07/2002  . EYE SURGERY  01/12/2013   laser   . IR GENERIC HISTORICAL  02/12/2016   IR ANGIO INTRA EXTRACRAN SEL COM CAROTID INNOMINATE BILAT MOD SED 02/12/2016 Julieanne CottonSanjeev Deveshwar, MD [facility]  . IR GENERIC HISTORICAL  02/12/2016   IR ANGIO VERTEBRAL SEL SUBCLAVIAN  INNOMINATE UNI L MOD SED 02/12/2016 Julieanne CottonSanjeev Deveshwar, MD [facility]  . IR GENERIC HISTORICAL  02/12/2016   IR ANGIO VERTEBRAL SEL VERTEBRAL UNI R MOD SED 02/12/2016 Julieanne CottonSanjeev Deveshwar, MD [facility]  . RADIOLOGY WITH ANESTHESIA N/A 04/20/2012   Procedure: RADIOLOGY WITH ANESTHESIA;  Surgeon: Oneal GroutSanjeev K Deveshwar, MD;  Location: [facility];  Service: Radiology;  Laterality: N/A;  . RADIOLOGY WITH ANESTHESIA N/A 05/26/2012   Procedure: RADIOLOGY WITH ANESTHESIA;  Surgeon: Oneal GroutSanjeev K Deveshwar, MD;  Location: [facility];  Service: Radiology;  Laterality: N/A;   Social History   Socioeconomic History  . Marital status: Married    Spouse name: Not on file  . Number of children: 2  . Years of education: Not on file  . Highest education level: Not on file  Occupational History  . Occupation: Buyer, retailANALYST    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  Social Needs  . Financial resource strain: Not on file  . Food insecurity:    Worry: Not on file    Inability: Not on file  . Transportation  needs:    Medical: Not on file    Non-medical: Not on file  Tobacco Use  . Smoking status: Former Smoker    Packs/day: 1.50    Years: 26.00    Pack years: 39.00    Types: Cigarettes    Last attempt to quit: 07/08/2003    Years since quitting: 14.6  . Smokeless tobacco: Never Used  . Tobacco comment: quit smoking in 2005  Substance and Sexual Activity  . Alcohol use: No  . Drug use: No  . Sexual activity: Not Currently  Lifestyle  . Physical activity:    Days per week: Not on file    Minutes per session: Not on file  . Stress: Not on file  Relationships  . Social connections:    Talks on phone: Not on file    Gets together: Not on file    Attends religious service: Not on file    Active member of club or organization: Not on file    Attends meetings of clubs or organizations: Not on file    Relationship status: Not on file  Other Topics Concern  . Not on file  Social History Narrative   Caffeine Use-no          Outpatient Encounter Medications as of 02/15/2018  Medication Sig  . albuterol (PROVENTIL HFA;VENTOLIN HFA) 108 (90 BASE) MCG/ACT inhaler Inhale 2 puffs into the lungs every 6 (six) hours as needed for wheezing.  . cetirizine (ZYRTEC) 10 MG tablet Take 10 mg by mouth daily.  . Cholecalciferol (VITAMIN D3) 125 MCG (5000 UT) CAPS Take 1 capsule (5,000 Units total) by mouth daily.  Marland Kitchen escitalopram (LEXAPRO) 20 MG tablet Take 20 mg by mouth daily.  . insulin degludec (TRESIBA FLEXTOUCH) 100 UNIT/ML SOPN FlexTouch Pen Inject 0.2 mLs (20 Units total) into the skin at bedtime.  . Insulin Pen Needle (B-D ULTRAFINE III SHORT PEN) 31G X 8 MM MISC 1 each by Does not apply route as directed.  Marland Kitchen levothyroxine (SYNTHROID, LEVOTHROID) 75 MCG tablet Take 75 mcg by mouth daily before breakfast.  . lisinopril (PRINIVIL,ZESTRIL) 5 MG tablet Take 5 mg by mouth daily.  Marland Kitchen LORazepam (ATIVAN) 1 MG tablet Take 2 tablets (2 mg total) by mouth 2 (two) times daily.  . metFORMIN (GLUCOPHAGE-XR) 500 MG 24 hr tablet Take 2 tablets (1,000 mg total) by mouth daily with breakfast.  . norethindrone (AYGESTIN) 5 MG tablet Take 2.5 mg by mouth daily.  . pregabalin (LYRICA) 150 MG capsule Take 150 mg by mouth at bedtime. For nerve pain   . Probiotic Product (PROBIOTIC DAILY PO) Take 1 capsule by mouth daily.  Marland Kitchen Propylene Glycol (SYSTANE BALANCE OP) Place 3 drops into both eyes daily.  Marland Kitchen topiramate (TOPAMAX) 50 MG tablet Take 1 tablet (50 mg total) by mouth daily as needed. For migraines (Patient taking differently: Take 50 mg by mouth at bedtime. For migraines)  . [DISCONTINUED] DULoxetine (CYMBALTA) 60 MG capsule Take 60 mg by mouth daily.   Facility-Administered Encounter Medications as of 02/15/2018  Medication  . indocyanine green (IC-GREEN) injection 75 mg    ALLERGIES: Allergies  Allergen Reactions  . Cleocin [Clindamycin Hcl] Anaphylaxis  . Latex Swelling    "SEVERE REACTION"  When received flu shot.     VACCINATION STATUS: Immunization History  Administered Date(s) Administered  . Pneumococcal Polysaccharide-23 05/28/2012    Diabetes  She presents for her follow-up diabetic visit. She has type 2 diabetes mellitus. Her disease course has been worsening. There  are no hypoglycemic associated symptoms. Pertinent negatives for hypoglycemia include no confusion, headaches, pallor or seizures. Associated symptoms include polydipsia and polyuria. Pertinent negatives for diabetes include no chest pain, no fatigue and no polyphagia. There are no hypoglycemic complications. Symptoms are worsening. There are no diabetic complications. Risk factors for coronary artery disease include diabetes mellitus, dyslipidemia, obesity, sedentary lifestyle and tobacco exposure. Current diabetic treatments: She is currently taking metformin ER 1000 mg p.o. twice daily. Her weight is increasing steadily. She is following a generally unhealthy diet. When asked about meal planning, she reported none. She has had a previous visit with a dietitian. She never participates in exercise. (She did not bring any logs nor meter to review.  Her A1c has increased to 9.2% from 7.7%.) An ACE inhibitor/angiotensin II receptor blocker is being taken.  Hypertension  This is a chronic problem. The current episode started more than 1 year ago. The problem is controlled. Pertinent negatives include no chest pain, headaches, palpitations or shortness of breath. Risk factors for coronary artery disease include diabetes mellitus, obesity, sedentary lifestyle and smoking/tobacco exposure. Past treatments include ACE inhibitors. Identifiable causes of hypertension include a thyroid problem.  Thyroid Problem  Presents for follow-up (Patient is on levothyroxine 75 mcg p.o. nightly.) visit. Patient reports no cold intolerance, constipation, diarrhea, fatigue, heat intolerance or palpitations. The symptoms have been stable.    Review of Systems   Constitutional: Negative for chills, fatigue, fever and unexpected weight change.  HENT: Negative for trouble swallowing and voice change.   Eyes: Negative for visual disturbance.  Respiratory: Negative for cough, shortness of breath and wheezing.   Cardiovascular: Negative for chest pain, palpitations and leg swelling.  Gastrointestinal: Negative for constipation, diarrhea, nausea and vomiting.  Endocrine: Positive for polydipsia and polyuria. Negative for cold intolerance, heat intolerance and polyphagia.  Musculoskeletal: Negative for arthralgias and myalgias.  Skin: Negative for color change, pallor, rash and wound.  Neurological: Negative for seizures and headaches.  Psychiatric/Behavioral: Negative for confusion and suicidal ideas.    Objective:    BP 134/89   Pulse 82   Ht 5\' 6"  (1.676 m)   Wt 284 lb (128.8 kg)   BMI 45.84 kg/m   Wt Readings from Last 3 Encounters:  02/15/18 284 lb (128.8 kg)  02/15/18 284 lb (128.8 kg)  10/14/17 281 lb (127.5 kg)     Physical Exam Constitutional:      Appearance: She is well-developed.  HENT:     Head: Normocephalic and atraumatic.  Neck:     Musculoskeletal: Normal range of motion and neck supple.     Thyroid: No thyromegaly.     Trachea: No tracheal deviation.  Cardiovascular:     Rate and Rhythm: Normal rate.  Pulmonary:     Effort: Pulmonary effort is normal.  Abdominal:     Tenderness: There is no abdominal tenderness. There is no guarding.  Musculoskeletal: Normal range of motion.  Skin:    General: Skin is warm and dry.     Coloration: Skin is not pale.     Findings: No erythema or rash.  Neurological:     Mental Status: She is alert and oriented to person, place, and time.     Cranial Nerves: No cranial nerve deficit.     Coordination: Coordination normal.     Deep Tendon Reflexes: Reflexes are normal and symmetric.  Psychiatric:        Judgment: Judgment normal.     CMP     Component  Value Date/Time   NA  138 01/12/2018 1556   K 4.3 01/12/2018 1556   CL 104 01/12/2018 1556   CO2 27 01/12/2018 1556   GLUCOSE 270 (H) 01/12/2018 1556   BUN 14 01/12/2018 1556   CREATININE 0.78 01/12/2018 1556   CALCIUM 8.8 01/12/2018 1556   PROT 6.3 01/12/2018 1556   ALBUMIN 3.0 (L) 07/19/2012 0555   AST 9 (L) 01/12/2018 1556   ALT 11 01/12/2018 1556   ALKPHOS 92 07/19/2012 0555   BILITOT 0.5 01/12/2018 1556   GFRNONAA 90 01/12/2018 1556   GFRAA 104 01/12/2018 1556     Diabetic Labs (most recent): Lab Results  Component Value Date   HGBA1C 9.2 (H) 01/12/2018   HGBA1C 7.7 (H) 09/28/2017   HGBA1C 10 06/24/2017     Lipid Panel ( most recent) Lipid Panel     Component Value Date/Time   CHOL  11/24/2009 0634    143        ATP III CLASSIFICATION:  <200     mg/dL   Desirable  161-096  mg/dL   Borderline High  >=045    mg/dL   High          TRIG 409 11/24/2009 0634   HDL 35 (L) 11/24/2009 0634   CHOLHDL 4.1 11/24/2009 0634   VLDL 22 11/24/2009 0634   LDLCALC  11/24/2009 0634    86        Total Cholesterol/HDL:CHD Risk Coronary Heart Disease Risk Table                     Men   Women  1/2 Average Risk   3.4   3.3  Average Risk       5.0   4.4  2 X Average Risk   9.6   7.1  3 X Average Risk  23.4   11.0        Use the calculated Patient Ratio above and the CHD Risk Table to determine the patient's CHD Risk.        ATP III CLASSIFICATION (LDL):  <100     mg/dL   Optimal  811-914  mg/dL   Near or Above                    Optimal  130-159  mg/dL   Borderline  782-956  mg/dL   High  >213     mg/dL   Very High      Lab Results  Component Value Date   TSH 1.63 01/12/2018   TSH 0.88 09/28/2017   TSH 1.65 08/22/2011   TSH 4.678 (H) 11/24/2009   FREET4 1.1 01/12/2018   FREET4 1.1 09/28/2017      Assessment & Plan:   1. Uncontrolled type 2 diabetes mellitus with hyperglycemia (HCC)  - Olivia Payne has currently uncontrolled symptomatic type 2 DM since 47 years of age. -She  returns with loss of control of diabetes with A1c of 9.2%, increasing from 7.7%.    -Her labs are reviewed with her.  -her diabetes is complicated by obesity/sedentary life and Olivia Payne remains at a high risk for more acute and chronic complications which include CAD, CVA, CKD, retinopathy, and neuropathy. These are all discussed in detail with the patient.  - I have counseled her on diet management and weight loss, by adopting a carbohydrate restricted/protein rich diet. -She admits to dietary indiscretions including consumption of sweetened beverages.  -  Suggestion is made  for her to avoid simple carbohydrates  from her diet including Cakes, Sweet Desserts / Pastries, Ice Cream, Soda (diet and regular), Sweet Tea, Candies, Chips, Cookies, Store Bought Juices, Alcohol in Excess of  1-2 drinks a day, Artificial Sweeteners, and "Sugar-free" Products. This will help patient to have stable blood glucose profile and potentially avoid unintended weight gain.   - I encouraged her to switch to  unprocessed or minimally processed complex starch and increased protein intake (animal or plant source), fruits, and vegetables.  - she is advised to stick to a routine mealtimes to eat 3 meals  a day and avoid unnecessary snacks ( to snack only to correct hypoglycemia).   - I have approached her with the following individualized plan to manage diabetes and patient agrees:   -Based on her presentation with loss of control of diabetes with A1c of 9.2%, she is approached for additional basal insulin and she agrees.   -Discussed and initiated Tresiba 20 units nightly associated with strict monitoring of blood glucose 2 times a day-daily before breakfast and at bedtime.   -Her labs are consistent with normal kidney function and normal thyroid function. -She is advised to continue metformin 500 mg ER  Twice daily- after breakfast and after supper.  -Patient is encouraged to call clinic for blood glucose  levels less than 70 or above 300 mg /dl.  - Patient specific target  A1c;  LDL, HDL, Triglycerides, and  Waist Circumference were discussed in detail.  2) BP/HTN: Her blood pressure is controlled to target.   She is advised to continue her medications including lisinopril 5 mg p.o. daily.    3) Lipids/HPL:   Her recent lipid panel showed LDL at 88.  She is not on any statin medications.   4)  Weight/Diet: CDE consult is in progress  , exercise, and detailed carbohydrates information provided.   5) hypothyroidism  -Her previsit thyroid function tests are consistent with appropriate replacement.  I advised her to continue levothyroxine 75 mcg p.o. daily before breakfast.   - We discussed about correct intake of levothyroxine, at fasting, with water, separated by at least 30 minutes from breakfast, and separated by more than 4 hours from calcium, iron, multivitamins, acid reflux medications (PPIs). -Patient is made aware of the fact that thyroid hormone replacement is needed for life, dose to be adjusted by periodic monitoring of thyroid function tests.   6) Chronic Care/Health Maintenance:  -she  is on ACEI and  is encouraged to initiate and continue to follow up with Ophthalmology, Dentist,  Podiatrist at least yearly or according to recommendations, and advised to  stay away from smoking. I have recommended yearly flu vaccine and pneumonia vaccine at least every 5 years; moderate intensity exercise for up to 150 minutes weekly; and  sleep for at least 7 hours a day.  - I advised patient to maintain close follow up with Richardean Chimera, MD for primary care needs.  - Time spent with the patient: 25 min, of which >50% was spent in reviewing her  current and  previous labs, previous treatments, and medications doses and developing a plan for long-term care.  Olivia Payne participated in the discussions, expressed understanding, and voiced agreement with the above plans.  All questions were  answered to her satisfaction. she is encouraged to contact clinic should she have any questions or concerns prior to her return visit.  Follow up plan: - Return in about 3 months (around 05/17/2018) for Follow  up with Pre-visit Labs, Meter, and Logs.  Marquis Lunch, MD [facility] [address] [place], [place] [address] Phone: [phone]  Fax: [phone]    02/15/2018, 3:36 PM  This note was partially dictated with voice recognition software. Similar sounding words can be transcribed inadequately or may not  be corrected upon review.

## 2018-03-18 ENCOUNTER — Other Ambulatory Visit: Payer: Self-pay | Admitting: "Endocrinology

## 2018-03-22 ENCOUNTER — Other Ambulatory Visit: Payer: Self-pay | Admitting: "Endocrinology

## 2018-03-31 ENCOUNTER — Encounter: Payer: 59 | Attending: Family Medicine | Admitting: Nutrition

## 2018-03-31 VITALS — Ht 67.0 in | Wt 288.0 lb

## 2018-03-31 DIAGNOSIS — E1165 Type 2 diabetes mellitus with hyperglycemia: Secondary | ICD-10-CM | POA: Diagnosis present

## 2018-03-31 DIAGNOSIS — E118 Type 2 diabetes mellitus with unspecified complications: Secondary | ICD-10-CM | POA: Insufficient documentation

## 2018-03-31 DIAGNOSIS — IMO0002 Reserved for concepts with insufficient information to code with codable children: Secondary | ICD-10-CM

## 2018-03-31 NOTE — Patient Instructions (Addendum)
Goals 1. Make appt with EAP  2. Increase lower carb veggies with lunch and dinner 3.  Don't skip meals Exercise when you can 30 minutes 3-4 times per week.

## 2018-03-31 NOTE — Progress Notes (Signed)
  Medical Nutrition Therapy:  Appt start time:  1600 end time:  93235  Assessment:  Primary concerns today: Diabetes Type 2 and Obesity.  She notes she is under a lot of stress and is having a really hard time coping with her problems. Encouraged her to reach out to her AEP for counseling and stress issues. Provided information on [facility] also about seeing a therapist.  Emotionally upset today. FBS 121-216 mg/dl.     She is trying to make better choices but her stress level is causing her emotional eating issues.  Gained 4 lbs. Sees Dr. Fransico Him, Endo. Is taking Basaglar now instead of [place] due to cost:  20 units a day and Metformin 500 mg BID. Will get labs done next week..   Lab Results  Component Value Date   HGBA1C 9.2 (H) 01/12/2018   Wt Readings from Last 3 Encounters:  03/31/18 288 lb (130.6 kg)  02/15/18 284 lb (128.8 kg)  02/15/18 284 lb (128.8 kg)   Ht Readings from Last 3 Encounters:  03/31/18 5\' 7"  (1.702 m)  02/15/18 5\' 6"  (1.676 m)  10/14/17 5\' 7"  (1.702 m)   Body mass index is 45.11 kg/m. @BMIFA @ Facility age limit for growth percentiles is 20 years. Facility age limit for growth percentiles is 20 years.  Preferred Learning Style:  No preference indicated   Learning Readiness:     Ready  Change in progress   MEDICATIONS: See list   DIETARY INTAKE: 24-hr recall:  B ( AM): egg, Oatmeal, blueberries. [place] bacon, and milk Snk ( AM):  L ( PM): skipped or tuekey sandwich,  Snk ( PM):  D ( PM):  Baked spaghetti,   Dt. Pepper Snk ( PM):  Beverages: water.  Usual physical activity walking  Estimated energy needs: 1500 calories 170 g carbohydrates 112 g protein 42 g fat  Progress Towards Goal(s):  In progress.   Nutritional Diagnosis:  NB-1.1 Food and nutrition-related knowledge deficit As related to Diabetes .  As evidenced by A1C 13%..    Intervention:  Nutrition and Diabetes education provided on My Plate, CHO counting, meal planning,  portion sizes, timing of meals, avoiding snacks between meals unless having a low blood sugar, target ranges for A1C and blood sugars, signs/symptoms and treatment of hyper/hypoglycemia, monitoring blood sugars, taking medications as prescribed, benefits of exercising 30 minutes per day and prevention of complications of DM.   Goals 1. Make appt with EAP  2. Increase lower carb veggies with lunch and dinner 3.  Don't skip meals Exercise when you can 30 minutes 3-4 times per week.  .Teaching Method Utilized:  Visual Auditory Hands on  Handouts given during visit include:  The Plate Method   Meal Plan Card   Barriers to learning/adherence to lifestyle change: none  Demonstrated degree of understanding via:  Teach Back   Monitoring/Evaluation:  Dietary intake, exercise, meal planning, and body weight in 1 month(s).

## 2018-04-01 ENCOUNTER — Telehealth: Payer: Self-pay | Admitting: Nutrition

## 2018-04-01 ENCOUNTER — Encounter: Payer: Self-pay | Admitting: Nutrition

## 2018-04-01 NOTE — Telephone Encounter (Signed)
Pt. Was seen 03-31-18. FBS 121-216 mg/dl. Shared BS log with Dr. Fransico Him. He requested to have patient increase Basaglar from 20 units daily to 30 units a day. TC to patient to inform her to increase her Basaglar insulin to 30 units daily. She verbalized understanding.

## 2018-04-01 NOTE — Telephone Encounter (Signed)
Noted! Thank you

## 2018-04-03 ENCOUNTER — Other Ambulatory Visit: Payer: Self-pay | Admitting: "Endocrinology

## 2018-05-09 ENCOUNTER — Other Ambulatory Visit: Payer: Self-pay | Admitting: "Endocrinology

## 2018-06-08 ENCOUNTER — Other Ambulatory Visit: Payer: Self-pay | Admitting: "Endocrinology

## 2018-06-08 DIAGNOSIS — E1165 Type 2 diabetes mellitus with hyperglycemia: Secondary | ICD-10-CM

## 2018-06-19 LAB — COMPLETE METABOLIC PANEL WITH GFR
AG RATIO: 1.2 (calc) (ref 1.0–2.5)
ALBUMIN MSPROF: 3.7 g/dL (ref 3.6–5.1)
ALKALINE PHOSPHATASE (APISO): 121 U/L (ref 31–125)
ALT: 12 U/L (ref 6–29)
AST: 11 U/L (ref 10–35)
BILIRUBIN TOTAL: 0.6 mg/dL (ref 0.2–1.2)
BUN: 14 mg/dL (ref 7–25)
CO2: 25 mmol/L (ref 20–32)
CREATININE: 0.86 mg/dL (ref 0.50–1.10)
Calcium: 9.4 mg/dL (ref 8.6–10.2)
Chloride: 100 mmol/L (ref 98–110)
GFR, EST AFRICAN AMERICAN: 93 mL/min/{1.73_m2} (ref >=60)
GFR, EST NON AFRICAN AMERICAN: 80 mL/min/{1.73_m2} (ref >=60)
GLOBULIN: 3.1 g/dL (ref 1.9–3.7)
Glucose, Bld: 254 mg/dL — ABNORMAL HIGH (ref 65–99)
Potassium: 4.4 mmol/L (ref 3.5–5.3)
SODIUM: 136 mmol/L (ref 135–146)
Total Protein: 6.8 g/dL (ref 6.1–8.1)

## 2018-06-19 LAB — VITAMIN D 25 HYDROXY (VIT D DEFICIENCY, FRACTURES): Vit D, 25-Hydroxy: 38 ng/mL (ref 30–100)

## 2018-06-19 LAB — HEMOGLOBIN A1C
HEMOGLOBIN A1C: 10 %{Hb} — AB (ref <5.7)
MEAN PLASMA GLUCOSE: 240 (calc)
eAG (mmol/L): 13.3 (calc)

## 2018-06-27 ENCOUNTER — Other Ambulatory Visit: Payer: Self-pay | Admitting: "Endocrinology

## 2018-07-01 ENCOUNTER — Ambulatory Visit (INDEPENDENT_AMBULATORY_CARE_PROVIDER_SITE_OTHER): Payer: 59 | Admitting: "Endocrinology

## 2018-07-01 ENCOUNTER — Encounter: Payer: 59 | Attending: Family Medicine | Admitting: Nutrition

## 2018-07-01 ENCOUNTER — Encounter: Payer: Self-pay | Admitting: Nutrition

## 2018-07-01 ENCOUNTER — Encounter: Payer: Self-pay | Admitting: "Endocrinology

## 2018-07-01 ENCOUNTER — Other Ambulatory Visit: Payer: Self-pay

## 2018-07-01 VITALS — Wt 284.0 lb

## 2018-07-01 DIAGNOSIS — E1165 Type 2 diabetes mellitus with hyperglycemia: Secondary | ICD-10-CM | POA: Diagnosis not present

## 2018-07-01 DIAGNOSIS — E118 Type 2 diabetes mellitus with unspecified complications: Secondary | ICD-10-CM | POA: Insufficient documentation

## 2018-07-01 DIAGNOSIS — E039 Hypothyroidism, unspecified: Secondary | ICD-10-CM | POA: Diagnosis not present

## 2018-07-01 DIAGNOSIS — E559 Vitamin D deficiency, unspecified: Secondary | ICD-10-CM

## 2018-07-01 DIAGNOSIS — IMO0002 Reserved for concepts with insufficient information to code with codable children: Secondary | ICD-10-CM

## 2018-07-01 DIAGNOSIS — I10 Essential (primary) hypertension: Secondary | ICD-10-CM | POA: Diagnosis not present

## 2018-07-01 MED ORDER — BASAGLAR KWIKPEN 100 UNIT/ML ~~LOC~~ SOPN: 50.0000 [IU] | PEN_INJECTOR | Freq: Every day | SUBCUTANEOUS | 1 refills | Status: DC

## 2018-07-01 NOTE — Progress Notes (Signed)
07/01/2018, 3:36 PM                                                          Endocrinology Telehealth Visit Follow up Note -During COVID -19 Pandemic  This visit type was conducted due to national recommendations for restrictions regarding the COVID-19 Pandemic  in an effort to limit this patient's exposure and mitigate transmission of the corona virus.  Due to his co-morbid illnesses, Olivia Payne is at  moderate to high risk for complications without adequate follow up.  This format is felt to be most appropriate for her at this time.  I connected with this patient on 07/01/2018   by telephone and verified that I am speaking with the correct person using two identifiers. Olivia Payne, Jul 14, 1970. she has verbally consented to this visit. All issues noted in this document were discussed and addressed. The format was not optimal for physical exam.   Subjective:    Patient ID: Olivia Payne, female    DOB: 02-Nov-1970.  Olivia Payne is being engaged in telehealth via telephone to follow-up  for management of currently uncontrolled symptomatic type 2 diabetes, for thyroidism, hypertension. PMD:   Richardean Chimera, MD.   Past Medical History:  Diagnosis Date  . Allergy   . Anxiety   . Asthma   . 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 Has had multiple procedures.   . Cerebral aneurysm   . Cerebral AVM   . Depression   . Diabetes mellitus with other specified manifestations    peripheral neuropathy  . Facial pain    since intubation  . GERD (gastroesophageal reflux disease)    hx  . History of chicken pox   . Hypertension   . Hypothyroidism   . Migraine   . Neuropathy   . OA (osteoarthritis) of knee    left knee with chronic pain  . Sleep apnea    does not use Cpap  . Thyroid disease    Past Surgical History:  Procedure Laterality Date  . cerebral arteriogram    . CESAREAN SECTION  1995  . CRANIOPLASTY Right 07/09/2012   Procedure: CRANIOPLASTY;   Surgeon: Carmela Hurt, MD;  Location: [facility];  Service: Neurosurgery;  Laterality: Right;  Cranioplasty with bone retrieval from abdominal pocket  . 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 PLACEMENT OF BONE FLAB IN ABDOMINAL WALL;  Surgeon: Carmela Hurt, MD;  Location: [facility];  Service: Neurosurgery;  Laterality: Right;  RIGHT Craniectomy for arteriovenous malformation resection  . DILATION AND CURETTAGE OF UTERUS  06/07/2002  . EYE SURGERY  01/12/2013   laser   . IR GENERIC HISTORICAL  02/12/2016   IR ANGIO INTRA EXTRACRAN SEL COM CAROTID INNOMINATE BILAT MOD SED 02/12/2016 Julieanne Cotton, MD [facility]  . IR GENERIC HISTORICAL  02/12/2016   IR ANGIO VERTEBRAL SEL SUBCLAVIAN INNOMINATE UNI L MOD SED 02/12/2016 Julieanne Cotton, MD [facility]  . IR GENERIC HISTORICAL  02/12/2016   IR ANGIO VERTEBRAL SEL VERTEBRAL UNI R MOD SED 02/12/2016 Julieanne Cotton, MD [facility]  . RADIOLOGY WITH ANESTHESIA N/A 04/20/2012   Procedure: RADIOLOGY WITH ANESTHESIA;  Surgeon: Oneal Grout, MD;  Location: [facility];  Service: Radiology;  Laterality: N/A;  . RADIOLOGY WITH ANESTHESIA N/A 05/26/2012   Procedure: RADIOLOGY WITH ANESTHESIA;  Surgeon: Oneal Grout, MD;  Location: [facility];  Service: Radiology;  Laterality: N/A;   Social History   Socioeconomic History  . Marital status: Married    Spouse name: Not on file  . Number of children: 2  . Years of education: Not on file  . Highest education level: Not on file  Occupational History  . 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 Financial resource strain: Not on file  . Food insecurity:    Worry: Not on file    Inability: Not on file  . Transportation needs:    Medical: Not on file    Non-medical: Not on file  Tobacco Use  . Smoking status: Former Smoker    Packs/day: 1.50    Years: 26.00    Pack years:  39.00    Types: Cigarettes    Last attempt to quit: 07/08/2003    Years since quitting: 14.9  . Smokeless tobacco: Never Used  . Tobacco comment: quit smoking in 2005  Substance and Sexual Activity  . Alcohol use: No  . Drug use: No  . Sexual activity: Not Currently  Lifestyle  . Physical activity:    Days per week: Not on file    Minutes per session: Not on file  . Stress: Not on file  Relationships  . Social connections:    Talks on phone: Not on file    Gets together: Not on file    Attends religious service: Not on file    Active member of club or organization: Not on file    Attends meetings of clubs or organizations: Not on file    Relationship status: Not on file  Other Topics Concern  . Not on file  Social History Narrative   Caffeine Use-no         Outpatient Encounter Medications as of 07/01/2018  Medication Sig  . albuterol (PROVENTIL HFA;VENTOLIN HFA) 108 (90 BASE) MCG/ACT inhaler Inhale 2 puffs into the lungs every 6 (six) hours as needed for wheezing.  . cetirizine (ZYRTEC) 10 MG tablet Take 10 mg by mouth daily.  . [employer] D3 125 MCG (5000 UT) capsule TAKE 1 CAPSULE (5,000 UNITS TOTAL) BY MOUTH DAILY.  Marland Kitchen escitalopram (LEXAPRO) 20 MG tablet Take 20 mg by mouth daily.  . Insulin Glargine (BASAGLAR KWIKPEN) 100 UNIT/ML SOPN Inject 0.5 mLs (50 Units total) into the skin at bedtime.  . Insulin Pen Needle (B-D ULTRAFINE III SHORT PEN) 31G X 8 MM MISC 1 each by Does not apply route as directed.  Marland Kitchen levothyroxine (SYNTHROID, LEVOTHROID) 75 MCG tablet Take 75 mcg by mouth daily before breakfast.  . lisinopril (PRINIVIL,ZESTRIL) 5 MG tablet Take 5 mg by mouth daily.  Marland Kitchen LORazepam (ATIVAN) 1 MG tablet Take 2 tablets (2 mg total) by mouth 2 (two) times daily.  . metFORMIN (GLUCOPHAGE-XR) 500 MG 24 hr tablet TAKE 2 TABLETS BY MOUTH EVERY DAY WITH BREAKFAST  . norethindrone (AYGESTIN) 5 MG tablet Take 2.5 mg by mouth daily.  . pregabalin (LYRICA) 150 MG capsule Take 150 mg by mouth  at bedtime. For nerve pain   . Probiotic Product (PROBIOTIC DAILY PO) Take 1 capsule by mouth daily.  Marland Kitchen Propylene Glycol (SYSTANE BALANCE OP) Place 3 drops into both eyes daily.  Marland Kitchen topiramate (TOPAMAX) 50 MG tablet Take 1 tablet (50  mg total) by mouth daily as needed. For migraines (Patient taking differently: Take 50 mg by mouth at bedtime. For migraines)  . [DISCONTINUED] Insulin Glargine (BASAGLAR KWIKPEN) 100 UNIT/ML SOPN INJECT 0.2 MLS (20 UNITS TOTAL) INTO THE SKIN AT BEDTIME.   Facility-Administered Encounter Medications as of 07/01/2018  Medication  . indocyanine green (IC-GREEN) injection 75 mg    ALLERGIES: Allergies  Allergen Reactions  . Cleocin [Clindamycin Hcl] Anaphylaxis  . Latex Swelling    "SEVERE REACTION"  When received flu shot.    VACCINATION STATUS: Immunization History  Administered Date(s) Administered  . Pneumococcal Polysaccharide-23 05/28/2012    Diabetes  She presents for her follow-up diabetic visit. She has type 2 diabetes mellitus. Her disease course has been worsening. There are no hypoglycemic associated symptoms. Pertinent negatives for hypoglycemia include no confusion, headaches, pallor or seizures. Associated symptoms include polydipsia and polyuria. Pertinent negatives for diabetes include no chest pain, no fatigue and no polyphagia. There are no hypoglycemic complications. Symptoms are worsening. There are no diabetic complications. Risk factors for coronary artery disease include diabetes mellitus, dyslipidemia, obesity, sedentary lifestyle and tobacco exposure. Current diabetic treatments: She is currently taking metformin ER 1000 mg p.o. twice daily. She is following a generally unhealthy diet. When asked about meal planning, she reported none. She has had a previous visit with a dietitian. She never participates in exercise. Her overall blood glucose range is >200 mg/dl. (She is reporting uncontrolled glycemic profile from 180- 247 at fasting while  taking Basaglar 30 units at bedtime, and her recent labs show A1c of 10% increasing from 9.2%.   ) An ACE inhibitor/angiotensin II receptor blocker is being taken.  Hypertension  This is a chronic problem. The current episode started more than 1 year ago. The problem is controlled. Pertinent negatives include no chest pain, headaches, palpitations or shortness of breath. Risk factors for coronary artery disease include diabetes mellitus, obesity, sedentary lifestyle and smoking/tobacco exposure. Past treatments include ACE inhibitors. Identifiable causes of hypertension include a thyroid problem.  Thyroid Problem  Presents for follow-up (Patient is on levothyroxine 75 mcg p.o. nightly.) visit. Patient reports no cold intolerance, constipation, diarrhea, fatigue, heat intolerance or palpitations. The symptoms have been stable.    Review of systems: Limited as above   Objective:    There were no vitals taken for this visit.  Wt Readings from Last 3 Encounters:  03/31/18 288 lb (130.6 kg)  02/15/18 284 lb (128.8 kg)  02/15/18 284 lb (128.8 kg)      CMP     Component Value Date/Time   NA 136 06/18/2018 1446   K 4.4 06/18/2018 1446   CL 100 06/18/2018 1446   CO2 25 06/18/2018 1446   GLUCOSE 254 (H) 06/18/2018 1446   BUN 14 06/18/2018 1446   CREATININE 0.86 06/18/2018 1446   CALCIUM 9.4 06/18/2018 1446   PROT 6.8 06/18/2018 1446   ALBUMIN 3.0 (L) 07/19/2012 0555   AST 11 06/18/2018 1446   ALT 12 06/18/2018 1446   ALKPHOS 92 07/19/2012 0555   BILITOT 0.6 06/18/2018 1446   GFRNONAA 80 06/18/2018 1446   GFRAA 93 06/18/2018 1446     Diabetic Labs (most recent): Lab Results  Component Value Date   HGBA1C 10.0 (H) 06/18/2018   HGBA1C 9.2 (H) 01/12/2018   HGBA1C 7.7 (H) 09/28/2017     Lipid Panel ( most recent) Lipid Panel     Component Value Date/Time   CHOL  11/24/2009 0634    143  ATP III CLASSIFICATION:  <200     mg/dL   Desirable  [id number]  mg/dL   Borderline  High  >=045>=240    mg/dL   High          TRIG 409110 11/24/2009 0634   HDL 35 (L) 11/24/2009 0634   CHOLHDL 4.1 11/24/2009 0634   VLDL 22 11/24/2009 0634   LDLCALC  11/24/2009 0634    86        Total Cholesterol/HDL:CHD Risk Coronary Heart Disease Risk Table                     Men   Women  1/2 Average Risk   3.4   3.3  Average Risk       5.0   4.4  2 X Average Risk   9.6   7.1  3 X Average Risk  23.4   11.0        Use the calculated Patient Ratio above and the CHD Risk Table to determine the patient's CHD Risk.        ATP III CLASSIFICATION (LDL):  <100     mg/dL   Optimal  811-914100-129  mg/dL   Near or Above                    Optimal  130-159  mg/dL   Borderline  782-956160-189  mg/dL   High  >213>190     mg/dL   Very High      Lab Results  Component Value Date   TSH 1.63 01/12/2018   TSH 0.88 09/28/2017   TSH 1.65 08/22/2011   TSH 4.678 (H) 11/24/2009   FREET4 1.1 01/12/2018   FREET4 1.1 09/28/2017      Assessment & Plan:   1. Uncontrolled type 2 diabetes mellitus with hyperglycemia (HCC)  - Colin V Escoto has currently uncontrolled symptomatic type 2 DM since 48 years of age. -She reports persistently above target glycemic profile at fasting and A1c of 10% increasing from 9.2%.      -Her labs are reviewed with her.  -her diabetes is complicated by obesity/sedentary life and Martie LeeSabrina V Menger remains at a high risk for more acute and chronic complications which include CAD, CVA, CKD, retinopathy, and neuropathy. These are all discussed in detail with the patient.  - I have counseled her on diet management and weight loss, by adopting a carbohydrate restricted/protein rich diet.  - Patient admits there is a room for improvement in her diet and drink choices. -  Suggestion is made for her to avoid simple carbohydrates  from her diet including Cakes, Sweet Desserts / Pastries, Ice Cream, Soda (diet and regular), Sweet Tea, Candies, Chips, Cookies, Store Bought Juices, Alcohol in Excess  of  1-2 drinks a day, Artificial Sweeteners, and "Sugar-free" Products. This will help patient to have stable blood glucose profile and potentially avoid unintended weight gain.   - I encouraged her to switch to  unprocessed or minimally processed complex starch and increased protein intake (animal or plant source), fruits, and vegetables.  - she is advised to stick to a routine mealtimes to eat 3 meals  a day and avoid unnecessary snacks ( to snack only to correct hypoglycemia).   - I have approached her with the following individualized plan to manage diabetes and patient agrees:   -Based on her reported persistent severe hyperglycemia, she will continue to require higher dose of insulin.    -She is advised to  increase her Basaglar to 50 units nightly, associated with strict monitoring of blood glucose 4 times a day- before meals and at bedtime and visit in 2 weeks is advised.    -She is advised to continue metformin 500 mg ER  Twice daily- after breakfast and after supper.  -Patient is encouraged to call clinic for blood glucose levels less than 70 or above 200 mg /dlx 3   2) BP/HTN: she is advised to home monitor blood pressure and report if > 140/90 on 2 separate readings.    She is advised to continue her medications including lisinopril 5 mg p.o. daily.    3) Lipids/HPL:   Her recent lipid panel showed LDL at 88.  She is not on any statin medications.    4) hypothyroidism  -Her previsit thyroid function tests are consistent with appropriate replacement.  I advised her to continue levothyroxine 75 mcg p.o. daily before breakfast.   - We discussed about the correct intake of her thyroid hormone, on empty stomach at fasting, with water, separated by at least 30 minutes from breakfast and other medications,  and separated by more than 4 hours from calcium, iron, multivitamins, acid reflux medications (PPIs). -Patient is made aware of the fact that thyroid hormone replacement is needed  for life, dose to be adjusted by periodic monitoring of thyroid function tests.   6) Chronic Care/Health Maintenance:  -she  is on ACEI and  is encouraged to initiate and continue to follow up with Ophthalmology, Dentist,  Podiatrist at least yearly or according to recommendations, and advised to  stay away from smoking. I have recommended yearly flu vaccine and pneumonia vaccine at least every 5 years; moderate intensity exercise for up to 150 minutes weekly; and  sleep for at least 7 hours a day.  - I advised patient to maintain close follow up with Richardean Chimera, MD for primary care needs. - Patient Care Time Today:  25 min, of which >50% was spent in reviewing her  current and  previous labs/studies, previous treatments, and medications doses and developing a plan for long-term care based on the latest recommendations for standards of care.  Curlene Dolphin [place] participated in the discussions, expressed understanding, and voiced agreement with the above plans.  All questions were answered to her satisfaction. she is encouraged to contact clinic should she have any questions or concerns prior to her return visit.  Follow up plan: - Return in about 2 weeks (around 07/15/2018) for Follow up with Meter and Logs Only - no Labs.  Marquis Lunch, MD [facility] [address] [place], [place] [address] Phone: [phone]  Fax: [phone]    07/01/2018, 3:36 PM  This note was partially dictated with voice recognition software. Similar sounding words can be transcribed inadequately or may not  be corrected upon review.

## 2018-07-01 NOTE — Progress Notes (Signed)
Medical Nutrition Therapy:  Appt start time: 1130 end time:  1215  Assessment:  Primary concerns today: Diabetes Type 2 and Obesity.  FBS: 237-300's in am and 400's. She notes she is under a lot of stress and is having a really hard time coping with her problems. 30 units of Basaglar. Metformin 500 mg BID. Her diet is excessive In CHO at times and then insuffient at other times. Complains on constant hunger. Skips lunch sometimes. Eats large portions. Thought she was suppose to avoid carbs . Hasn't done AEP counseling because her mom told her they can't help with the finance issues. Encouraged her to seek counseling because they can assist with the emotional issues related to that.  She tends to have a lot of stress/anxiety but is only being treated for depression-Lexapro.  Is willing to readjust her meals and foods to better balance carb intake to improve blood sugars. Complains of extreme thirst, frequent urination, muscle aches, pain, anxiety and fatigue. Will have phone visit with Dr. Dorris Fetch today.  Stressed to her that she will need an increase in insulin most likey to be done by Dr. Dorris Fetch and that adjusting her diet as recommended will help her BS . Stressed its a constant adjustment as her diabetes is a chronic medical problem.  Reassured her she is not dehydrated according to her labs. She notes she drinks a gallon of water per day.   Lab Results  Component Value Date   HGBA1C 10.0 (H) 06/18/2018   Wt Readings from Last 3 Encounters:  07/01/18 284 lb (128.8 kg)  03/31/18 288 lb (130.6 kg)  02/15/18 284 lb (128.8 kg)   Ht Readings from Last 3 Encounters:  03/31/18 _0  (1.702 m)  02/15/18 _1  (1.676 m)  10/14/17 _2  (1.702 m)   Body mass index is 44.48 kg/m. _3 @ Facility age limit for growth percentiles is 20 years. Facility age limit for growth percentiles is 20 years. CMP Latest Ref Rng & Units 06/18/2018 01/12/2018 09/28/2017  Glucose 65 - 99 mg/dL 254(H) 270(H)  232(H)  BUN 7 - 25 mg/dL _4 Creatinine 0.50 - 1.10 mg/dL 0.86 0.78 0.91  Sodium 135 - 146 mmol/L 136 138 139  Potassium 3.5 - 5.3 mmol/L 4.4 4.3 4.2  Chloride 98 - 110 mmol/L 100 104 103  CO2 20 - 32 mmol/L _5 Calcium 8.6 - 10.2 mg/dL 9.4 8.8 9.0  Total Protein 6.1 - 8.1 g/dL 6.8 6.3 6.4  Total Bilirubin 0.2 - 1.2 mg/dL 0.6 0.5 0.6  Alkaline Phos 39 - 117 U/L - - -  AST 10 - 35 U/L 11 9(L) 10  ALT 6 - 29 U/L _6 Preferred Learning Style:  No preference indicated   Learning Readiness:     Ready  Change in progress   MEDICATIONS: See list   DIETARY INTAKE: 24-hr recall:  B ( AM): egg, Oatmeal-3 packets  , blueberries and 1 glass of milk12-16 oz-.   Snk ( AM): sometimes drinks chocolate milk in place of a meal L ( PM): High Protein hot pocket, snack PB sandwich D) pork chop, spinach, peas, potatoes, mac/cheese, water Snk ( PM):  Beverages: water.  Usual physical activity walking  Estimated energy needs: 1500 calories 170 g carbohydrates 112 g protein 42 g fat  Progress Towards Goal(s):  In progress.   Nutritional Diagnosis:  NB-1.1 Food and nutrition-related knowledge deficit As related to Diabetes .  As evidenced  by A1C 13%..    Intervention:  Nutrition and Diabetes education provided on My Plate, CHO counting, meal planning, portion sizes, timing of meals, avoiding snacks between meals unless having a low blood sugar, target ranges for A1C and blood sugars, signs/symptoms and treatment of hyper/hypoglycemia, monitoring blood sugars, taking medications as prescribed, benefits of exercising 30 minutes per day and prevention of complications of DM. Meal planning CHO balance   Goals Make appt with AEP counselor Limit to eating meals at times dicussed at breakfast, lunch and dinner Only eat low carb veggies for snacks Cut out chocolate milk. Limit milk and only drink water when thirsty Eat 2-3 servings of low carb veggies with lunch and  dinner Measure foods out Limit to eating only 1 pack of oatmeal with breakfast Walk 30 minutes daily Limit carbs to 30-45 grams per meal Get A1C down to 7% Take insulin as prescribed.    .Teaching Method Utilized:  Visual Auditory Hands on  Handouts given during visit include:  The Plate Method   Meal Plan Card   Barriers to learning/adherence to lifestyle change: none  Demonstrated degree of understanding via:  Teach Back   Monitoring/Evaluation:  Dietary intake, exercise, meal planning, and body weight in 1 month(s). She would benefit from seeing a therapist and being treated for her anxiety.

## 2018-07-01 NOTE — Patient Instructions (Addendum)
Goals Make appt with AEP counselor Limit to eating meals at times dicussed at breakfast, lunch and dinner Only eat low carb veggies for snacks Cut out chocolate milk. Limit milk and only drink water when thirsty Eat 2-3 servings of low carb veggies with lunch and dinner Measure foods out Limit to eating only 1 pack of oatmeal with breakfast Walk 30 minutes daily Limit carbs to 30-45 grams per meal Get A1C down to 7% Take insulin as prescribed.

## 2018-08-11 ENCOUNTER — Other Ambulatory Visit: Payer: Self-pay

## 2018-08-11 ENCOUNTER — Encounter: Payer: 59 | Attending: Family Medicine | Admitting: Nutrition

## 2018-08-11 DIAGNOSIS — E1165 Type 2 diabetes mellitus with hyperglycemia: Secondary | ICD-10-CM | POA: Insufficient documentation

## 2018-08-11 DIAGNOSIS — IMO0002 Reserved for concepts with insufficient information to code with codable children: Secondary | ICD-10-CM

## 2018-08-11 DIAGNOSIS — E118 Type 2 diabetes mellitus with unspecified complications: Secondary | ICD-10-CM | POA: Insufficient documentation

## 2018-08-11 DIAGNOSIS — I10 Essential (primary) hypertension: Secondary | ICD-10-CM

## 2018-08-11 NOTE — Progress Notes (Signed)
Medical Nutrition Therapy:  Appt start time: 1130 end time:  1215  Assessment:  Primary concerns today: Diabetes Type 2 and Obesity.  Had been on prednisone.Is no longer on it anymore and BS are getting better. Has been stressed due to financial problems. Encouraged her to seek counseling. Has been trying to work on eating better.  Drinking a gallon of drinking water Isn't sleeping. Has been taking Melatonin. Sleeping pill didn't work.Marland Kitchen Has been taking atavan. Metformin 500  ER  mg BID. Basaglar 50 units a day.  She tends to have a lot of stress/anxiety but is only being treated for depression-Lexapro.  Is willing to readjust her meals and foods to better balance carb intake to improve blood sugars. Gifford Shave to her that she will need an increase in insulin most likey to be done by Dr. Dorris Fetch and that adjusting her diet as recommended will help her BS . Stressed its a constant adjustment as her diabetes is a chronic medical problem.     Lab Results  Component Value Date   HGBA1C 10.0 (H) 06/18/2018   Wt Readings from Last 3 Encounters:  07/01/18 284 lb (128.8 kg)  03/31/18 288 lb (130.6 kg)  02/15/18 284 lb (128.8 kg)   Ht Readings from Last 3 Encounters:  03/31/18 _0  (1.702 m)  02/15/18 _1  (1.676 m)  10/14/17 _2  (1.702 m)   There is no height or weight on file to calculate BMI. _3 @ Facility age limit for growth percentiles is 20 years. Facility age limit for growth percentiles is 20 years. CMP Latest Ref Rng & Units 06/18/2018 01/12/2018 09/28/2017  Glucose 65 - 99 mg/dL 254(H) 270(H) 232(H)  BUN 7 - 25 mg/dL _4 Creatinine 0.50 - 1.10 mg/dL 0.86 0.78 0.91  Sodium 135 - 146 mmol/L 136 138 139  Potassium 3.5 - 5.3 mmol/L 4.4 4.3 4.2  Chloride 98 - 110 mmol/L 100 104 103  CO2 20 - 32 mmol/L _5 Calcium 8.6 - 10.2 mg/dL 9.4 8.8 9.0  Total Protein 6.1 - 8.1 g/dL 6.8 6.3 6.4  Total Bilirubin 0.2 - 1.2 mg/dL 0.6 0.5 0.6  Alkaline Phos 39 - 117 U/L - - -   AST 10 - 35 U/L 11 9(L) 10  ALT 6 - 29 U/L _6 Preferred Learning Style:  No preference indicated   Learning Readiness:     Ready  Change in progress   MEDICATIONS: See list   DIETARY INTAKE: 24-hr recall:  B ( AM): Egg salad sandwich or bowl of oatmeal Lunch: Skipped.  Snk ( AM):   D Grilled chicken with a baked potato, salad.:  snack D) pork chop, spinach, peas, potatoes, mac/cheese, water Snk ( PM):  Beverages: water.  Usual physical activity walking  Estimated energy needs: 1500 calories 170 g carbohydrates 112 g protein 42 g fat  Progress Towards Goal(s):  In progress.   Nutritional Diagnosis:  NB-1.1 Food and nutrition-related knowledge deficit As related to Diabetes .  As evidenced by A1C 13%..    Intervention:  Nutrition and Diabetes education provided on My Plate, CHO counting, meal planning, portion sizes, timing of meals, avoiding snacks between meals unless having a low blood sugar, target ranges for A1C and blood sugars, signs/symptoms and treatment of hyper/hypoglycemia, monitoring blood sugars, taking medications as prescribed, benefits of exercising 30 minutes per day and prevention of complications of DM. Meal planning CHO balance   Goals Follow My Plate  Eat 2-3 carb choices per meal Walk 30-60 minutes a day for needed weight loss Measure foods out for portion control Increase fresh fruits and vegetables. Goals FBS 80-130 mg/dl Get A1C to 7% Lose 1 lb per week Seek counseling to help with your stress    .Teaching Method Utilized:  Visual Auditory Hands on  Handouts given during visit include:  The Plate Method   Meal Plan Card   Barriers to learning/adherence to lifestyle change: none  Demonstrated degree of understanding via:  Teach Back   Monitoring/Evaluation:  Dietary intake, exercise, meal planning, and body weight in 1 month(s). She would benefit from seeing a therapist and being treated for her anxiety.

## 2018-08-13 ENCOUNTER — Other Ambulatory Visit: Payer: Self-pay | Admitting: "Endocrinology

## 2018-08-19 ENCOUNTER — Encounter: Payer: Self-pay | Admitting: Nutrition

## 2018-08-19 NOTE — Patient Instructions (Signed)
Goals Follow My Plate Eat 2-3 carb choices per meal Walk 30-60 minutes a day for needed weight loss Measure foods out for portion control Increase fresh fruits and vegetables. Goals FBS 80-130 mg/dl Get A1C to 7% Lose 1 lb per week Seek counseling to help with your stress

## 2018-09-14 ENCOUNTER — Ambulatory Visit: Payer: 59 | Admitting: Nutrition

## 2018-09-23 ENCOUNTER — Other Ambulatory Visit: Payer: Self-pay | Admitting: "Endocrinology

## 2018-11-14 ENCOUNTER — Other Ambulatory Visit: Payer: Self-pay | Admitting: "Endocrinology

## 2018-12-16 ENCOUNTER — Other Ambulatory Visit: Payer: Self-pay | Admitting: "Endocrinology

## 2018-12-16 ENCOUNTER — Ambulatory Visit (INDEPENDENT_AMBULATORY_CARE_PROVIDER_SITE_OTHER): Payer: 59 | Admitting: "Endocrinology

## 2018-12-16 ENCOUNTER — Encounter: Payer: Self-pay | Admitting: "Endocrinology

## 2018-12-16 ENCOUNTER — Other Ambulatory Visit: Payer: Self-pay

## 2018-12-16 DIAGNOSIS — E039 Hypothyroidism, unspecified: Secondary | ICD-10-CM | POA: Diagnosis not present

## 2018-12-16 DIAGNOSIS — I10 Essential (primary) hypertension: Secondary | ICD-10-CM

## 2018-12-16 DIAGNOSIS — E1165 Type 2 diabetes mellitus with hyperglycemia: Secondary | ICD-10-CM | POA: Diagnosis not present

## 2018-12-16 MED ORDER — BASAGLAR KWIKPEN 100 UNIT/ML ~~LOC~~ SOPN: 60.0000 [IU] | PEN_INJECTOR | Freq: Every day | SUBCUTANEOUS | 2 refills | Status: DC

## 2018-12-16 NOTE — Progress Notes (Signed)
12/16/2018, 4:54 PM                                                          Endocrinology Telehealth Visit Follow up Note -During COVID -19 Pandemic  This visit type was conducted due to national recommendations for restrictions regarding the COVID-19 Pandemic  in an effort to limit this patient's exposure and mitigate transmission of the corona virus.  Due to his co-morbid illnesses, Olivia Payne is at  moderate to high risk for complications without adequate follow up.  This format is felt to be most appropriate for her at this time.  I connected with this patient on 12/16/2018   by telephone and verified that I am speaking with the correct person using two identifiers. Olivia Payne, [birth year]. she has verbally consented to this visit. All issues noted in this document were discussed and addressed. The format was not optimal for physical exam.   Subjective:    Patient ID: Olivia Payne, female    DOB: [birth year].  JAILEY Payne is being engaged in telehealth via telephone to follow-up  for management of currently uncontrolled symptomatic type 2 diabetes, for thyroidism, hypertension. PMD:   Richardean Chimera, MD.   Past Medical History:  Diagnosis Date  . Allergy   . Anxiety   . Asthma   . 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 Has had multiple procedures.   . Cerebral aneurysm   . Cerebral AVM   . Depression   . Diabetes mellitus with other specified manifestations    peripheral neuropathy  . Facial pain    since intubation  . GERD (gastroesophageal reflux disease)    hx  . History of chicken pox   . Hypertension   . Hypothyroidism   . Migraine   . Neuropathy   . OA (osteoarthritis) of knee    left knee with chronic pain  . Sleep apnea    does not use Cpap  . Thyroid disease    Past Surgical History:  Procedure Laterality Date  . cerebral arteriogram    . CESAREAN SECTION  1995  . CRANIOPLASTY Right 07/09/2012   Procedure: CRANIOPLASTY;   Surgeon: Carmela Hurt, MD;  Location: [facility];  Service: Neurosurgery;  Laterality: Right;  Cranioplasty with bone retrieval from abdominal pocket  . 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 PLACEMENT OF BONE FLAB IN ABDOMINAL WALL;  Surgeon: Carmela Hurt, MD;  Location: [facility];  Service: Neurosurgery;  Laterality: Right;  RIGHT Craniectomy for arteriovenous malformation resection  . DILATION AND CURETTAGE OF UTERUS  06/07/2002  . EYE SURGERY  01/12/2013   laser   . IR GENERIC HISTORICAL  02/12/2016   IR ANGIO INTRA EXTRACRAN SEL COM CAROTID INNOMINATE BILAT MOD SED 02/12/2016 Julieanne Cotton, MD [facility]  . IR GENERIC HISTORICAL  02/12/2016   IR ANGIO VERTEBRAL SEL SUBCLAVIAN INNOMINATE UNI L MOD SED 02/12/2016 Julieanne Cotton, MD [facility]  . IR GENERIC HISTORICAL  02/12/2016   IR ANGIO VERTEBRAL SEL VERTEBRAL UNI R MOD SED 02/12/2016 Julieanne Cotton, MD [facility]  . RADIOLOGY WITH ANESTHESIA N/A 04/20/2012   Procedure: RADIOLOGY WITH ANESTHESIA;  Surgeon: Oneal Grout, MD;  Location: [facility];  Service: Radiology;  Laterality: N/A;  . RADIOLOGY WITH ANESTHESIA N/A 05/26/2012   Procedure: RADIOLOGY WITH ANESTHESIA;  Surgeon: Oneal Grout, MD;  Location: [facility];  Service: Radiology;  Laterality: N/A;   Social History   Socioeconomic History  . Marital status: Married    Spouse name: Not on file  . Number of children: 2  . Years of education: Not on file  . Highest education level: Not on file  Occupational History  . 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 Financial resource strain: Not on file  . Food insecurity    Worry: Not on file    Inability: Not on file  . Transportation needs    Medical: Not on file    Non-medical: Not on file  Tobacco Use  . Smoking status: Former Smoker    Packs/day: 1.50    Years: 26.00    Pack years:  39.00    Types: Cigarettes    Quit date: 07/08/2003    Years since quitting: 15.4  . Smokeless tobacco: Never Used  . Tobacco comment: quit smoking in 2005  Substance and Sexual Activity  . Alcohol use: No  . Drug use: No  . Sexual activity: Not Currently  Lifestyle  . Physical activity    Days per week: Not on file    Minutes per session: Not on file  . Stress: Not on file  Relationships  . Social Musician on phone: Not on file    Gets together: Not on file    Attends religious service: Not on file    Active member of club or organization: Not on file    Attends meetings of clubs or organizations: Not on file    Relationship status: Not on file  Other Topics Concern  . Not on file  Social History Narrative   Caffeine Use-no         Outpatient Encounter Medications as of 12/16/2018  Medication Sig  . albuterol (PROVENTIL HFA;VENTOLIN HFA) 108 (90 BASE) MCG/ACT inhaler Inhale 2 puffs into the lungs every 6 (six) hours as needed for wheezing.  . cetirizine (ZYRTEC) 10 MG tablet Take 10 mg by mouth daily.  . [employer] D3 125 MCG (5000 UT) capsule TAKE 1 CAPSULE (5,000 UNITS TOTAL) BY MOUTH DAILY.  Marland Kitchen escitalopram (LEXAPRO) 20 MG tablet Take 20 mg by mouth daily.  . Insulin Glargine (BASAGLAR KWIKPEN) 100 UNIT/ML SOPN Inject 0.6 mLs (60 Units total) into the skin at bedtime.  . Insulin Pen Needle (B-D ULTRAFINE III SHORT PEN) 31G X 8 MM MISC 1 each by Does not apply route as directed.  Marland Kitchen levothyroxine (SYNTHROID, LEVOTHROID) 75 MCG tablet Take 75 mcg by mouth daily before breakfast.  . lisinopril (PRINIVIL,ZESTRIL) 5 MG tablet Take 5 mg by mouth daily.  Marland Kitchen LORazepam (ATIVAN) 1 MG tablet Take 2 tablets (2 mg total) by mouth 2 (two) times daily.  . metFORMIN (GLUCOPHAGE-XR) 500 MG 24 hr tablet TAKE 2 TABLETS BY MOUTH EVERY DAY WITH BREAKFAST  . norethindrone (AYGESTIN) 5 MG tablet Take 2.5 mg by mouth daily.  . pregabalin (LYRICA) 150 MG capsule Take 150 mg by mouth at bedtime.  For nerve pain   . Probiotic Product (PROBIOTIC DAILY PO) Take 1 capsule by mouth daily.  Marland Kitchen Propylene Glycol (SYSTANE BALANCE OP) Place 3 drops into both eyes daily.  Marland Kitchen topiramate (TOPAMAX) 50 MG tablet Take 1 tablet (50 mg total)  by mouth daily as needed. For migraines (Patient taking differently: Take 50 mg by mouth at bedtime. For migraines)  . [DISCONTINUED] Insulin Glargine (BASAGLAR KWIKPEN) 100 UNIT/ML SOPN INJECT 0.5 MLS (50 UNITS TOTAL) INTO THE SKIN AT BEDTIME.   Facility-Administered Encounter Medications as of 12/16/2018  Medication  . indocyanine green (IC-GREEN) injection 75 mg    ALLERGIES: Allergies  Allergen Reactions  . Cleocin [Clindamycin Hcl] Anaphylaxis  . Latex Swelling    "SEVERE REACTION"  When received flu shot.    VACCINATION STATUS: Immunization History  Administered Date(s) Administered  . Pneumococcal Polysaccharide-23 05/28/2012    Diabetes She presents for her follow-up diabetic visit. She has type 2 diabetes mellitus. Her disease course has been worsening. There are no hypoglycemic associated symptoms. Pertinent negatives for hypoglycemia include no confusion, headaches, pallor or seizures. Associated symptoms include polydipsia and polyuria. Pertinent negatives for diabetes include no chest pain, no fatigue and no polyphagia. There are no hypoglycemic complications. Symptoms are worsening. There are no diabetic complications. Risk factors for coronary artery disease include diabetes mellitus, dyslipidemia, obesity, sedentary lifestyle and tobacco exposure. Current diabetic treatments: She is currently taking metformin ER 1000 mg p.o. twice daily. She is following a generally unhealthy diet. When asked about meal planning, she reported none. She has had a previous visit with a dietitian. She never participates in exercise. Her overall blood glucose range is >200 mg/dl. (She missed her appointment since 12/21/2018, has not been consistent with monitoring her  blood glucose readings.  She reports rare and random blood glucose readings ranging between 158-277. Her last A1c was 10%.  She did not go for her labs before this visit.  ) An ACE inhibitor/angiotensin II receptor blocker is being taken.  Hypertension This is a chronic problem. The current episode started more than 1 year ago. The problem is controlled. Pertinent negatives include no chest pain, headaches, palpitations or shortness of breath. Risk factors for coronary artery disease include diabetes mellitus, obesity, sedentary lifestyle and smoking/tobacco exposure. Past treatments include ACE inhibitors. Identifiable causes of hypertension include a thyroid problem.  Thyroid Problem Presents for follow-up (Patient is on levothyroxine 75 mcg p.o. nightly.) visit. Patient reports no cold intolerance, constipation, diarrhea, fatigue, heat intolerance or palpitations. The symptoms have been stable.    Review of systems: Limited as above   Objective:    There were no vitals taken for this visit.  Wt Readings from Last 3 Encounters:  07/01/18 284 lb (128.8 kg)  03/31/18 288 lb (130.6 kg)  02/15/18 284 lb (128.8 kg)      CMP     Component Value Date/Time   NA 136 06/18/2018 1446   K 4.4 06/18/2018 1446   CL 100 06/18/2018 1446   CO2 25 06/18/2018 1446   GLUCOSE 254 (H) 06/18/2018 1446   BUN 14 06/18/2018 1446   CREATININE 0.86 06/18/2018 1446   CALCIUM 9.4 06/18/2018 1446   PROT 6.8 06/18/2018 1446   ALBUMIN 3.0 (L) 07/19/2012 0555   AST 11 06/18/2018 1446   ALT 12 06/18/2018 1446   ALKPHOS 92 07/19/2012 0555   BILITOT 0.6 06/18/2018 1446   GFRNONAA 80 06/18/2018 1446   GFRAA 93 06/18/2018 1446     Diabetic Labs (most recent): Lab Results  Component Value Date   HGBA1C 10.0 (H) 06/18/2018   HGBA1C 9.2 (H) 01/12/2018   HGBA1C 7.7 (H) 09/28/2017     Lipid Panel ( most recent) Lipid Panel     Component Value Date/Time  CHOL  11/24/2009 0634    143        ATP III  CLASSIFICATION:  <200     mg/dL   Desirable  841-324  mg/dL   Borderline High  >=401    mg/dL   High          TRIG 027 11/24/2009 0634   HDL 35 (L) 11/24/2009 0634   CHOLHDL 4.1 11/24/2009 0634   VLDL 22 11/24/2009 0634   LDLCALC  11/24/2009 0634    86        Total Cholesterol/HDL:CHD Risk Coronary Heart Disease Risk Table                     Men   Women  1/2 Average Risk   3.4   3.3  Average Risk       5.0   4.4  2 X Average Risk   9.6   7.1  3 X Average Risk  23.4   11.0        Use the calculated Patient Ratio above and the CHD Risk Table to determine the patient's CHD Risk.        ATP III CLASSIFICATION (LDL):  <100     mg/dL   Optimal  253-664  mg/dL   Near or Above                    Optimal  130-159  mg/dL   Borderline  403-474  mg/dL   High  >259     mg/dL   Very High      Lab Results  Component Value Date   TSH 1.63 01/12/2018   TSH 0.88 09/28/2017   TSH 1.65 08/22/2011   TSH 4.678 (H) 11/24/2009   FREET4 1.1 01/12/2018   FREET4 1.1 09/28/2017      Assessment & Plan:   1. Uncontrolled type 2 diabetes mellitus with hyperglycemia (HCC)  - Olivia Payne has currently uncontrolled symptomatic type 2 DM since 48 years of age. -She missed her appointment in May 2020, reports inconsistent blood glucose readings ranging from 158-277, does not monitor regularly.  Her A1c was 10% in April 2020.     -her diabetes is complicated by obesity/sedentary life and Kya V Achterberg remains at a high risk for more acute and chronic complications which include CAD, CVA, CKD, retinopathy, and neuropathy. These are all discussed in detail with the patient.  - I have counseled her on diet management and weight loss, by adopting a carbohydrate restricted/protein rich diet.  - she  admits there is a room for improvement in her diet and drink choices. -  Suggestion is made for her to avoid simple carbohydrates  from her diet including Cakes, Sweet Desserts / Pastries, Ice Cream,  Soda (diet and regular), Sweet Tea, Candies, Chips, Cookies, Sweet Pastries,  Store Bought Juices, Alcohol in Excess of  1-2 drinks a day, Artificial Sweeteners, Coffee Creamer, and "Sugar-free" Products. This will help patient to have stable blood glucose profile and potentially avoid unintended weight gain.  - I encouraged her to switch to  unprocessed or minimally processed complex starch and increased protein intake (animal or plant source), fruits, and vegetables.  - she is advised to stick to a routine mealtimes to eat 3 meals  a day and avoid unnecessary snacks ( to snack only to correct hypoglycemia).   - I have approached her with the following individualized plan to manage diabetes and patient agrees:   -  Based on her reported persistent severe hyperglycemia, she will continue to require higher dose of insulin.   -She is advised to increase her Basaglar to 60 units nightly, associated with strict monitoring of blood glucose 4 times a day-before meals and at bedtime and return in 10 days for office visit and new labs including CMP and A1c. -If her A1c remains above 9%, she will be considered for prandial insulin.  -She is advised to continue metformin 500 mg ER  Twice daily- after breakfast and after supper.  -Patient is encouraged to call clinic for blood glucose levels less than 70 or above 200 mg /dlx 3   2) BP/HTN: she is advised to home monitor blood pressure and report if > 140/90 on 2 separate readings.    She is advised to continue her medications including lisinopril 5 mg p.o. daily.    3) Lipids/HPL:   Her recent lipid panel showed LDL at 88.  She is not on any statin medications.    4) hypothyroidism  -She is due for another thyroid function tests.  She is advised to continue  levothyroxine 75 mcg p.o. daily before breakfast.   - We discussed about the correct intake of her thyroid hormone, on empty stomach at fasting, with water, separated by at least 30 minutes from  breakfast and other medications,  and separated by more than 4 hours from calcium, iron, multivitamins, acid reflux medications (PPIs). -Patient is made aware of the fact that thyroid hormone replacement is needed for life, dose to be adjusted by periodic monitoring of thyroid function tests.   6) Chronic Care/Health Maintenance:  -she  is on ACEI and  is encouraged to initiate and continue to follow up with Ophthalmology, Dentist,  Podiatrist at least yearly or according to recommendations, and advised to  stay away from smoking. I have recommended yearly flu vaccine and pneumonia vaccine at least every 5 years; moderate intensity exercise for up to 150 minutes weekly; and  sleep for at least 7 hours a day.  - I advised patient to maintain close follow up with Caryl Bis, MD for primary care needs.  - Patient Care Time Today:  25 min, of which >50% was spent in  counseling and the rest reviewing her  current and  previous labs/studies, previous treatments, her blood glucose readings, and medications' doses and developing a plan for long-term care based on the latest recommendations for standards of care.   Ebbie Latus Kierstead participated in the discussions, expressed understanding, and voiced agreement with the above plans.  All questions were answered to her satisfaction. she is encouraged to contact clinic should she have any questions or concerns prior to her return visit.   Follow up plan: - Return in about 10 days (around 12/26/2018) for Follow up with Pre-visit Labs, Next Visit A1c in Office, Include 8 log sheets.  Glade Lloyd, MD [facility] [address] [place], [place] [address] Phone: [phone]  Fax: [phone]    12/16/2018, 4:54 PM  This note was partially dictated with voice recognition software. Similar sounding words can be transcribed inadequately or may not  be corrected upon review.

## 2018-12-17 ENCOUNTER — Other Ambulatory Visit: Payer: Self-pay | Admitting: "Endocrinology

## 2018-12-18 LAB — COMPLETE METABOLIC PANEL WITH GFR
AG Ratio: 1.3 (calc) (ref 1.0–2.5)
ALT: 8 U/L (ref 6–29)
AST: 9 U/L — ABNORMAL LOW (ref 10–35)
Albumin: 3.9 g/dL (ref 3.6–5.1)
Alkaline phosphatase (APISO): 117 U/L (ref 31–125)
BUN: 17 mg/dL (ref 7–25)
CO2: 25 mmol/L (ref 20–32)
Calcium: 9.6 mg/dL (ref 8.6–10.2)
Chloride: 103 mmol/L (ref 98–110)
Creat: 0.99 mg/dL (ref 0.50–1.10)
GFR, Est African American: 78 mL/min/{1.73_m2} (ref >=60)
GFR, Est Non African American: 67 mL/min/{1.73_m2} (ref >=60)
Globulin: 2.9 g/dL (calc) (ref 1.9–3.7)
Glucose, Bld: 129 mg/dL (ref 65–139)
Potassium: 4.7 mmol/L (ref 3.5–5.3)
Sodium: 137 mmol/L (ref 135–146)
Total Bilirubin: 0.5 mg/dL (ref 0.2–1.2)
Total Protein: 6.8 g/dL (ref 6.1–8.1)

## 2018-12-18 LAB — TSH: TSH: 2.48 mIU/L

## 2018-12-18 LAB — MICROALBUMIN / CREATININE URINE RATIO
Creatinine, Urine: 102 mg/dL (ref 20–275)
Microalb Creat Ratio: 6 mcg/mg creat (ref <30)
Microalb, Ur: 0.6 mg/dL

## 2018-12-18 LAB — T4, FREE: Free T4: 0.9 ng/dL (ref 0.8–1.8)

## 2018-12-18 LAB — VITAMIN D 25 HYDROXY (VIT D DEFICIENCY, FRACTURES): Vit D, 25-Hydroxy: 37 ng/mL (ref 30–100)

## 2018-12-28 ENCOUNTER — Encounter: Payer: Self-pay | Admitting: "Endocrinology

## 2018-12-28 ENCOUNTER — Other Ambulatory Visit: Payer: Self-pay

## 2018-12-28 ENCOUNTER — Ambulatory Visit (INDEPENDENT_AMBULATORY_CARE_PROVIDER_SITE_OTHER): Payer: 59 | Admitting: "Endocrinology

## 2018-12-28 VITALS — BP 131/81 | HR 78 | Ht 66.0 in | Wt 290.0 lb

## 2018-12-28 DIAGNOSIS — E039 Hypothyroidism, unspecified: Secondary | ICD-10-CM

## 2018-12-28 DIAGNOSIS — I10 Essential (primary) hypertension: Secondary | ICD-10-CM | POA: Diagnosis not present

## 2018-12-28 DIAGNOSIS — E1165 Type 2 diabetes mellitus with hyperglycemia: Secondary | ICD-10-CM

## 2018-12-28 DIAGNOSIS — E559 Vitamin D deficiency, unspecified: Secondary | ICD-10-CM

## 2018-12-28 LAB — POCT GLYCOSYLATED HEMOGLOBIN (HGB A1C): Hemoglobin A1C: 8.4 % — AB (ref 4.0–5.6)

## 2018-12-28 MED ORDER — GLIPIZIDE ER 5 MG PO TB24: 5.0000 mg | ORAL_TABLET | Freq: Every day | ORAL | 3 refills | Status: DC

## 2018-12-28 MED ORDER — BASAGLAR KWIKPEN 100 UNIT/ML ~~LOC~~ SOPN: 70.0000 [IU] | PEN_INJECTOR | Freq: Every day | SUBCUTANEOUS | 2 refills | Status: DC

## 2018-12-28 NOTE — Patient Instructions (Signed)

## 2018-12-28 NOTE — Progress Notes (Signed)
12/28/2018, 4:07 PM                                                          Endocrinology Telehealth Visit Follow up Note -During COVID -19 Pandemic  This visit type was conducted due to national recommendations for restrictions regarding the COVID-19 Pandemic  in an effort to limit this patient's exposure and mitigate transmission of the corona virus.  Due to his co-morbid illnesses, Olivia Payne is at  moderate to high risk for complications without adequate follow up.  This format is felt to be most appropriate for her at this time.  I connected with this patient on 12/28/2018   by telephone and verified that I am speaking with the correct person using two identifiers. Olivia Payne, 1970-06-10. she has verbally consented to this visit. All issues noted in this document were discussed and addressed. The format was not optimal for physical exam.   Subjective:    Patient ID: Olivia Payne, female    DOB: 21-Oct-1970.  Olivia Payne is being engaged in telehealth via telephone to follow-up  for management of currently uncontrolled symptomatic type 2 diabetes, for thyroidism, hypertension. PMD:   Caryl Bis, MD.   Past Medical History:  Diagnosis Date  . Allergy   . Anxiety   . Asthma   . 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 Has had multiple procedures.   . Cerebral aneurysm   . Cerebral AVM   . Depression   . Diabetes mellitus with other specified manifestations    peripheral neuropathy  . Facial pain    since intubation  . GERD (gastroesophageal reflux disease)    hx  . History of chicken pox   . Hypertension   . Hypothyroidism   . Migraine   . Neuropathy   . OA (osteoarthritis) of knee    left knee with chronic pain  . Sleep apnea    does not use Cpap  . Thyroid disease    Past Surgical History:  Procedure Laterality Date  . cerebral arteriogram    . CESAREAN SECTION  1995  . CRANIOPLASTY Right 07/09/2012   Procedure: CRANIOPLASTY;   Surgeon: Winfield Cunas, MD;  Location: [facility];  Service: Neurosurgery;  Laterality: Right;  Cranioplasty with bone retrieval from abdominal pocket  . 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 PLACEMENT OF BONE FLAB IN ABDOMINAL WALL;  Surgeon: Winfield Cunas, MD;  Location: [facility];  Service: Neurosurgery;  Laterality: Right;  RIGHT Craniectomy for arteriovenous malformation resection  . DILATION AND CURETTAGE OF UTERUS  06/07/2002  . EYE SURGERY  01/12/2013   laser   . IR GENERIC HISTORICAL  02/12/2016   IR ANGIO INTRA EXTRACRAN SEL COM CAROTID INNOMINATE BILAT MOD SED 02/12/2016 Luanne Bras, MD [facility]  . IR GENERIC HISTORICAL  02/12/2016   IR ANGIO VERTEBRAL SEL SUBCLAVIAN INNOMINATE UNI L MOD SED 02/12/2016 Luanne Bras, MD [facility]  . IR GENERIC HISTORICAL  02/12/2016   IR ANGIO VERTEBRAL SEL VERTEBRAL UNI R MOD SED 02/12/2016 Luanne Bras, MD [facility]  . RADIOLOGY WITH ANESTHESIA N/A 04/20/2012   Procedure: RADIOLOGY WITH ANESTHESIA;  Surgeon: Rob Hickman, MD;  Location: [facility];  Service: Radiology;  Laterality: N/A;  . RADIOLOGY WITH ANESTHESIA N/A 05/26/2012   Procedure: RADIOLOGY WITH ANESTHESIA;  Surgeon: Oneal GroutSanjeev K Deveshwar, MD;  Location: [facility];  Service: Radiology;  Laterality: N/A;   Social History   Socioeconomic History  . Marital status: Married    Spouse name: Not on file  . Number of children: 2  . Years of education: Not on file  . Highest education level: Not on file  Occupational History  . Occupation: Buyer, retailANALYST    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  Social Needs  . Financial resource strain: Not on file  . Food insecurity    Worry: Not on file    Inability: Not on file  . Transportation needs    Medical: Not on file    Non-medical: Not on file  Tobacco Use  . Smoking status: Former Smoker    Packs/day: 1.50    Years: 26.00    Pack years:  39.00    Types: Cigarettes    Quit date: 07/08/2003    Years since quitting: 15.4  . Smokeless tobacco: Never Used  . Tobacco comment: quit smoking in 2005  Substance and Sexual Activity  . Alcohol use: No  . Drug use: No  . Sexual activity: Not Currently  Lifestyle  . Physical activity    Days per week: Not on file    Minutes per session: Not on file  . Stress: Not on file  Relationships  . Social Musicianconnections    Talks on phone: Not on file    Gets together: Not on file    Attends religious service: Not on file    Active member of club or organization: Not on file    Attends meetings of clubs or organizations: Not on file    Relationship status: Not on file  Other Topics Concern  . Not on file  Social History Narrative   Caffeine Use-no         Outpatient Encounter Medications as of 12/28/2018  Medication Sig  . albuterol (PROVENTIL HFA;VENTOLIN HFA) 108 (90 BASE) MCG/ACT inhaler Inhale 2 puffs into the lungs every 6 (six) hours as needed for wheezing.  . cetirizine (ZYRTEC) 10 MG tablet Take 10 mg by mouth daily.  . [employer] D3 125 MCG (5000 UT) capsule TAKE 1 CAPSULE (5,000 UNITS TOTAL) BY MOUTH DAILY.  Marland Kitchen. escitalopram (LEXAPRO) 20 MG tablet Take 20 mg by mouth daily.  Marland Kitchen. glipiZIDE (GLUCOTROL XL) 5 MG 24 hr tablet Take 1 tablet (5 mg total) by mouth daily with breakfast.  . Insulin Glargine (BASAGLAR KWIKPEN) 100 UNIT/ML SOPN Inject 0.7 mLs (70 Units total) into the skin at bedtime.  . Insulin Pen Needle (B-D ULTRAFINE III SHORT PEN) 31G X 8 MM MISC 1 each by Does not apply route as directed.  Marland Kitchen. levothyroxine (SYNTHROID, LEVOTHROID) 75 MCG tablet Take 75 mcg by mouth daily before breakfast.  . lisinopril (PRINIVIL,ZESTRIL) 5 MG tablet Take 5 mg by mouth daily.  Marland Kitchen. LORazepam (ATIVAN) 1 MG tablet Take 2 tablets (2 mg total) by mouth 2 (two) times daily.  . metFORMIN (GLUCOPHAGE-XR) 500 MG 24 hr tablet TAKE 2 TABLETS BY MOUTH EVERY DAY WITH BREAKFAST  . norethindrone (AYGESTIN) 5 MG  tablet Take 2.5 mg by mouth daily.  . pregabalin (LYRICA) 150 MG capsule Take 150 mg by mouth at bedtime. For nerve pain   . Probiotic Product (PROBIOTIC DAILY PO) Take 1 capsule by mouth daily.  Marland Kitchen. Propylene Glycol (SYSTANE BALANCE  OP) Place 3 drops into both eyes daily.  Marland Kitchen topiramate (TOPAMAX) 50 MG tablet Take 1 tablet (50 mg total) by mouth daily as needed. For migraines (Patient taking differently: Take 50 mg by mouth at bedtime. For migraines)  . [DISCONTINUED] Insulin Glargine (BASAGLAR KWIKPEN) 100 UNIT/ML SOPN Inject 0.6 mLs (60 Units total) into the skin at bedtime.   Facility-Administered Encounter Medications as of 12/28/2018  Medication  . indocyanine green (IC-GREEN) injection 75 mg    ALLERGIES: Allergies  Allergen Reactions  . Cleocin [Clindamycin Hcl] Anaphylaxis  . Latex Swelling    "SEVERE REACTION"  When received flu shot.    VACCINATION STATUS: Immunization History  Administered Date(s) Administered  . Pneumococcal Polysaccharide-23 05/28/2012    Diabetes She presents for her follow-up diabetic visit. She has type 2 diabetes mellitus. Her disease course has been improving. There are no hypoglycemic associated symptoms. Pertinent negatives for hypoglycemia include no confusion, headaches, pallor or seizures. Pertinent negatives for diabetes include no chest pain, no fatigue, no polydipsia, no polyphagia and no polyuria. There are no hypoglycemic complications. Symptoms are improving. There are no diabetic complications. Risk factors for coronary artery disease include diabetes mellitus, dyslipidemia, obesity, sedentary lifestyle and tobacco exposure. Current diabetic treatments: She is currently taking metformin ER 1000 mg p.o. twice daily. She is following a generally unhealthy diet. When asked about meal planning, she reported none. She has had a previous visit with a dietitian. She never participates in exercise. Her breakfast blood glucose range is generally 140-180  mg/dl. Her bedtime blood glucose range is generally >200 mg/dl. Her overall blood glucose range is >200 mg/dl. (She presents with near target fasting glycemic profile, however significantly above target postprandial blood glucose readings stable. However point-of-care A1c was improving at 8.4%.  ) An ACE inhibitor/angiotensin II receptor blocker is being taken.  Hypertension This is a chronic problem. The current episode started more than 1 year ago. The problem is controlled. Pertinent negatives include no chest pain, headaches, palpitations or shortness of breath. Risk factors for coronary artery disease include diabetes mellitus, obesity, sedentary lifestyle and smoking/tobacco exposure. Past treatments include ACE inhibitors. Identifiable causes of hypertension include a thyroid problem.  Thyroid Problem Presents for follow-up (Patient is on levothyroxine 75 mcg p.o. nightly.) visit. Patient reports no cold intolerance, constipation, diarrhea, fatigue, heat intolerance or palpitations. The symptoms have been stable.    Review of systems: Limited as above   Objective:    BP 131/81   Pulse 78   Ht  (1.676 m)   Wt 290 lb (131.5 kg)   BMI 46.81 kg/m   Wt Readings from Last 3 Encounters:  12/28/18 290 lb (131.5 kg)  07/01/18 284 lb (128.8 kg)  03/31/18 288 lb (130.6 kg)      CMP     Component Value Date/Time   NA 137 12/17/2018 1346   K 4.7 12/17/2018 1346   CL 103 12/17/2018 1346   CO2 25 12/17/2018 1346   GLUCOSE 129 12/17/2018 1346   BUN 17 12/17/2018 1346   CREATININE 0.99 12/17/2018 1346   CALCIUM 9.6 12/17/2018 1346   PROT 6.8 12/17/2018 1346   ALBUMIN 3.0 (L) 07/19/2012 0555   AST 9 (L) 12/17/2018 1346   ALT 8 12/17/2018 1346   ALKPHOS 92 07/19/2012 0555   BILITOT 0.5 12/17/2018 1346   GFRNONAA 67 12/17/2018 1346   GFRAA 78 12/17/2018 1346     Diabetic Labs (most recent): Lab Results  Component Value Date   HGBA1C  8.4 (A) 12/28/2018   HGBA1C 10.0 (H)  06/18/2018   HGBA1C 9.2 (H) 01/12/2018     Lipid Panel ( most recent) Lipid Panel     Component Value Date/Time   CHOL  11/24/2009 0634    143        ATP III CLASSIFICATION:  <200     mg/dL   Desirable  657-846  mg/dL   Borderline High  >=962    mg/dL   High          TRIG 952 11/24/2009 0634   HDL 35 (L) 11/24/2009 0634   CHOLHDL 4.1 11/24/2009 0634   VLDL 22 11/24/2009 0634   LDLCALC  11/24/2009 0634    86        Total Cholesterol/HDL:CHD Risk Coronary Heart Disease Risk Table                     Men   Women  1/2 Average Risk   3.4   3.3  Average Risk       5.0   4.4  2 X Average Risk   9.6   7.1  3 X Average Risk  23.4   11.0        Use the calculated Patient Ratio above and the CHD Risk Table to determine the patient's CHD Risk.        ATP III CLASSIFICATION (LDL):  <100     mg/dL   Optimal  841-324  mg/dL   Near or Above                    Optimal  130-159  mg/dL   Borderline  401-027  mg/dL   High  >253     mg/dL   Very High      Lab Results  Component Value Date   TSH 2.48 12/17/2018   TSH 1.63 01/12/2018   TSH 0.88 09/28/2017   TSH 1.65 08/22/2011   TSH 4.678 (H) 11/24/2009   FREET4 0.9 12/17/2018   FREET4 1.1 01/12/2018   FREET4 1.1 09/28/2017      Assessment & Plan:   1. Uncontrolled type 2 diabetes mellitus with hyperglycemia (HCC)  - Olivia Payne has currently uncontrolled symptomatic type 2 DM since 48 years of age. -She presents with near target fasting glycemic profile, significantly above target postprandial glycemic profile.  Her point-of-care A1c is 8.4% improving from 10% in April 2020.   -her diabetes is complicated by obesity/sedentary life and Olivia Payne remains at a high risk for more acute and chronic complications which include CAD, CVA, CKD, retinopathy, and neuropathy. These are all discussed in detail with the patient.  - I have counseled her on diet management and weight loss, by adopting a carbohydrate  restricted/protein rich diet.  - she  admits there is a room for improvement in her diet and drink choices. -  Suggestion is made for her to avoid simple carbohydrates  from her diet including Cakes, Sweet Desserts / Pastries, Ice Cream, Soda (diet and regular), Sweet Tea, Candies, Chips, Cookies, Sweet Pastries,  Store Bought Juices, Alcohol in Excess of  1-2 drinks a day, Artificial Sweeteners, Coffee Creamer, and "Sugar-free" Products. This will help patient to have stable blood glucose profile and potentially avoid unintended weight gain.   - I encouraged her to switch to  unprocessed or minimally processed complex starch and increased protein intake (animal or plant source), fruits, and vegetables.  - she is advised to  stick to a routine mealtimes to eat 3 meals  a day and avoid unnecessary snacks ( to snack only to correct hypoglycemia).   - I have approached her with the following individualized plan to manage diabetes and patient agrees:   -Based on her presentation with postprandial hyperglycemia, she will need more treatment for her diabetes.    -She is advised to increase her Basaglar to 70 units nightly, associated with strict monitoring of blood glucose 2 times a day-before meals and at bedtime. -She will not need prandial insulin for now.  -She is advised to continue metformin 500 mg ER  Twice daily- after breakfast and after supper.  -Patient is encouraged to call clinic for blood glucose levels less than 70 or above 200 mg /dlx 3 -To give her better control of postprandial hyperglycemia, she is approached with glipizide therapy.  I discussed initiated glipizide 5 mg XL p.o. daily at breakfast.   2) BP/HTN: she is advised to home monitor blood pressure and report if > 140/90 on 2 separate readings.     She is advised to continue her medications including lisinopril 5 mg p.o. daily.    3) Lipids/HPL:   Her recent lipid panel showed LDL at 88.  She is not on any statin  medications.    4) hypothyroidism  -She is due for another thyroid function tests.  She is advised to continue  levothyroxine 75 mcg p.o. daily before breakfast.   - We discussed about the correct intake of her thyroid hormone, on empty stomach at fasting, with water, separated by at least 30 minutes from breakfast and other medications,  and separated by more than 4 hours from calcium, iron, multivitamins, acid reflux medications (PPIs). -Patient is made aware of the fact that thyroid hormone replacement is needed for life, dose to be adjusted by periodic monitoring of thyroid function tests.   6) Chronic Care/Health Maintenance:  -she  is on ACEI and  is encouraged to initiate and continue to follow up with Ophthalmology, Dentist,  Podiatrist at least yearly or according to recommendations, and advised to  stay away from smoking. I have recommended yearly flu vaccine and pneumonia vaccine at least every 5 years; moderate intensity exercise for up to 150 minutes weekly; and  sleep for at least 7 hours a day.  - I advised patient to maintain close follow up with Richardean Chimera, MD for primary care needs.  -- Time spent with the patient: 25 min, of which >50% was spent in reviewing her  current and  previous labs/studies, previous treatments, and medications doses and developing a plan for long-term care based on the latest recommendations for standards of care. Please refer to " Patient Self Inventory" in the Media  tab for reviewed elements of pertinent patient history.  Olivia Payne participated in the discussions, expressed understanding, and voiced agreement with the above plans.  All questions were answered to her satisfaction. she is encouraged to contact clinic should she have any questions or concerns prior to her return visit.   Follow up plan: - Return in about 3 months (around 03/30/2019) for Bring Meter and Logs- A1c in Office.  Marquis Lunch, MD [facility] [address] [place], [place] [address] Phone: [phone]  Fax: [phone]    12/28/2018, 4:07 PM  This note was partially dictated with voice recognition software. Similar sounding words can be transcribed inadequately or may not  be corrected upon review.

## 2018-12-29 ENCOUNTER — Telehealth: Payer: Self-pay | Admitting: "Endocrinology

## 2018-12-29 NOTE — Telephone Encounter (Signed)
Pts insurance will no longer pay for [employer].

## 2018-12-30 ENCOUNTER — Other Ambulatory Visit: Payer: Self-pay | Admitting: "Endocrinology

## 2018-12-30 NOTE — Telephone Encounter (Signed)
She did not pick up her phone.  She may call back.  Looks like her insurance is not paying for [employer], Tresiba, Lantus, Levemir and for [employer].  Not sure if it is a co-pay issue or formulary issue.  She may have to be switched to Novolin 70/30 or Humulin 70/30 to use twice a day if none of the above choices are covered by her insurance.

## 2019-01-03 NOTE — Telephone Encounter (Signed)
Notified pt by VM that since the Basaglar is no longer covered to contact her insurance company and find out what is preferred and has better coverage.

## 2019-01-04 MED ORDER — BASAGLAR KWIKPEN 100 UNIT/ML ~~LOC~~ SOPN: 70.0000 [IU] | PEN_INJECTOR | Freq: Every day | SUBCUTANEOUS | 0 refills | Status: DC

## 2019-01-04 NOTE — Addendum Note (Signed)
Addended by: Lavell Luster A on: 01/04/2019 03:00 PM   Modules accepted: Orders

## 2019-01-04 NOTE — Telephone Encounter (Signed)
()   day supply sent to pharmacy

## 2019-01-04 NOTE — Telephone Encounter (Signed)
Patient called back and said she spoke with insurance and they will cover 90 day supply just not 30

## 2019-01-11 ENCOUNTER — Telehealth: Payer: Self-pay | Admitting: "Endocrinology

## 2019-01-11 ENCOUNTER — Other Ambulatory Visit: Payer: Self-pay | Admitting: "Endocrinology

## 2019-01-11 NOTE — Telephone Encounter (Signed)
Pt called and said that she received a call and voicemail from you on October 29th and would like to know if you could call her back, she said she did not know what you needed. She called yesterday and I did not see anything in the chart as to why you would have called.

## 2019-01-11 NOTE — Telephone Encounter (Signed)
We did a visit on October 27, did not call her on 29. I do not see any reason either. Just tell her to keep her appointment and same plan as we discussed on the 27th,

## 2019-01-14 ENCOUNTER — Telehealth: Payer: Self-pay

## 2019-01-14 NOTE — Telephone Encounter (Signed)
Olivia Payne is taking prednisone and her blood sugars last night was 289 and this morning was 151 and took 75 units of insulin, please advise? [phone]

## 2019-01-14 NOTE — Telephone Encounter (Signed)
Pt did not answer her phone x 2. Continue on Lantus 75 units qhs, as long as the prednisone treatment lasts.

## 2019-01-17 NOTE — Telephone Encounter (Signed)
Pt.notified

## 2019-03-18 ENCOUNTER — Other Ambulatory Visit: Payer: Self-pay | Admitting: "Endocrinology

## 2019-03-23 ENCOUNTER — Other Ambulatory Visit: Payer: Self-pay | Admitting: "Endocrinology

## 2019-04-01 ENCOUNTER — Other Ambulatory Visit: Payer: Self-pay

## 2019-04-01 ENCOUNTER — Ambulatory Visit (INDEPENDENT_AMBULATORY_CARE_PROVIDER_SITE_OTHER): Payer: No Typology Code available for payment source | Admitting: "Endocrinology

## 2019-04-01 ENCOUNTER — Encounter: Payer: Self-pay | Admitting: "Endocrinology

## 2019-04-01 VITALS — BP 133/82 | HR 75 | Ht 66.0 in | Wt 295.4 lb

## 2019-04-01 DIAGNOSIS — E039 Hypothyroidism, unspecified: Secondary | ICD-10-CM | POA: Diagnosis not present

## 2019-04-01 DIAGNOSIS — E559 Vitamin D deficiency, unspecified: Secondary | ICD-10-CM

## 2019-04-01 DIAGNOSIS — I10 Essential (primary) hypertension: Secondary | ICD-10-CM | POA: Diagnosis not present

## 2019-04-01 DIAGNOSIS — E1165 Type 2 diabetes mellitus with hyperglycemia: Secondary | ICD-10-CM | POA: Diagnosis not present

## 2019-04-01 LAB — POCT GLYCOSYLATED HEMOGLOBIN (HGB A1C): Hemoglobin A1C: 6.7 % — AB (ref 4.0–5.6)

## 2019-04-01 MED ORDER — BASAGLAR KWIKPEN 100 UNIT/ML ~~LOC~~ SOPN: 50.0000 [IU] | PEN_INJECTOR | Freq: Every day | SUBCUTANEOUS | 0 refills | Status: DC

## 2019-04-01 NOTE — Progress Notes (Signed)
04/01/2019, 12:06 PM                     Endocrinology follow-up note   Subjective:    Patient ID: Olivia Payne, female    DOB: 08-May-1970.  Olivia DolphinSabrina V Payne is being seen in follow-up  for management of currently uncontrolled symptomatic type 2 diabetes, for thyroidism, hypertension. PMD:   Richardean Chimeraaniel, Terry G, MD.   Past Medical History:  Diagnosis Date  . Allergy   . Anxiety   . Asthma   . 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 Has had multiple procedures.   . Cerebral aneurysm   . Cerebral AVM   . Depression   . Diabetes mellitus with other specified manifestations    peripheral neuropathy  . Facial pain    since intubation  . GERD (gastroesophageal reflux disease)    hx  . History of chicken pox   . Hypertension   . Hypothyroidism   . Migraine   . Neuropathy   . OA (osteoarthritis) of knee    left knee with chronic pain  . Sleep apnea    does not use Cpap  . Thyroid disease    Past Surgical History:  Procedure Laterality Date  . cerebral arteriogram    . CESAREAN SECTION  1995  . CRANIOPLASTY Right 07/09/2012   Procedure: CRANIOPLASTY;  Surgeon: Carmela HurtKyle L Cabbell, MD;  Location: [facility];  Service: Neurosurgery;  Laterality: Right;  Cranioplasty with bone retrieval from abdominal pocket  . 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 PLACEMENT OF BONE FLAB IN ABDOMINAL WALL;  Surgeon: Carmela HurtKyle L Cabbell, MD;  Location: [facility];  Service: Neurosurgery;  Laterality: Right;  RIGHT Craniectomy for arteriovenous malformation resection  . DILATION AND CURETTAGE OF UTERUS  06/07/2002  . EYE SURGERY  01/12/2013   laser   . IR GENERIC HISTORICAL  02/12/2016   IR ANGIO INTRA EXTRACRAN SEL COM CAROTID INNOMINATE BILAT MOD SED 02/12/2016 Julieanne CottonSanjeev Deveshwar, MD [facility]  . IR GENERIC HISTORICAL  02/12/2016   IR ANGIO VERTEBRAL SEL SUBCLAVIAN INNOMINATE UNI L MOD SED 02/12/2016  Julieanne CottonSanjeev Deveshwar, MD [facility]  . IR GENERIC HISTORICAL  02/12/2016   IR ANGIO VERTEBRAL SEL VERTEBRAL UNI R MOD SED 02/12/2016 Julieanne CottonSanjeev Deveshwar, MD [facility]  . RADIOLOGY WITH ANESTHESIA N/A 04/20/2012   Procedure: RADIOLOGY WITH ANESTHESIA;  Surgeon: Oneal GroutSanjeev K Deveshwar, MD;  Location: [facility];  Service: Radiology;  Laterality: N/A;  . RADIOLOGY WITH ANESTHESIA N/A 05/26/2012   Procedure: RADIOLOGY WITH ANESTHESIA;  Surgeon: Oneal GroutSanjeev K Deveshwar, MD;  Location: [facility];  Service: Radiology;  Laterality: N/A;   Social History   Socioeconomic History  . Marital status: Married    Spouse name: Not on file  . Number of children: 2  . Years of education: Not on file  . Highest education level: Not on file  Occupational History  . Occupation: ANALYST    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  Tobacco Use  . Smoking status: Former Smoker    Packs/day: 1.50    Years: 26.00    Pack years: 39.00    Types: Cigarettes    Quit date: 07/08/2003    Years since quitting: 15.7  . Smokeless tobacco: Never Used  . Tobacco comment: quit smoking in 2005  Substance and Sexual Activity  . Alcohol use: No  . Drug use: No  .  Sexual activity: Not Currently  Other Topics Concern  . Not on file  Social History Narrative   Caffeine Use-no         Social Determinants of Health   Financial Resource Strain:   . Difficulty of Paying Living Expenses: Not on file  Food Insecurity:   . Worried About Programme researcher, broadcasting/film/video in the Last Year: Not on file  . Ran Out of Food in the Last Year: Not on file  Transportation Needs:   . Lack of Transportation (Medical): Not on file  . Lack of Transportation (Non-Medical): Not on file  Physical Activity:   . Days of Exercise per Week: Not on file  . Minutes of Exercise per Session: Not on file  Stress:   . Feeling of Stress : Not on file  Social Connections:   . Frequency of Communication with Friends and Family: Not on file  . Frequency of Social Gatherings  with Friends and Family: Not on file  . Attends Religious Services: Not on file  . Active Member of Clubs or Organizations: Not on file  . Attends Banker Meetings: Not on file  . Marital Status: Not on file   Outpatient Encounter Medications as of 04/01/2019  Medication Sig  . albuterol (PROVENTIL HFA;VENTOLIN HFA) 108 (90 BASE) MCG/ACT inhaler Inhale 2 puffs into the lungs every 6 (six) hours as needed for wheezing.  . cetirizine (ZYRTEC) 10 MG tablet Take 10 mg by mouth daily.  . [employer] D3 125 MCG (5000 UT) capsule TAKE 1 CAPSULE (5,000 UNITS TOTAL) BY MOUTH DAILY.  Marland Kitchen escitalopram (LEXAPRO) 20 MG tablet Take 20 mg by mouth daily.  Marland Kitchen glipiZIDE (GLUCOTROL XL) 5 MG 24 hr tablet TAKE 1 TABLET BY MOUTH EVERY DAY WITH BREAKFAST  . Insulin Glargine (BASAGLAR KWIKPEN) 100 UNIT/ML SOPN Inject 0.5 mLs (50 Units total) into the skin at bedtime.  . Insulin Pen Needle (B-D ULTRAFINE III SHORT PEN) 31G X 8 MM MISC 1 each by Does not apply route as directed.  Marland Kitchen levothyroxine (SYNTHROID, LEVOTHROID) 75 MCG tablet Take 75 mcg by mouth daily before breakfast.  . lisinopril (PRINIVIL,ZESTRIL) 5 MG tablet Take 5 mg by mouth daily.  Marland Kitchen LORazepam (ATIVAN) 1 MG tablet Take 2 tablets (2 mg total) by mouth 2 (two) times daily.  . metFORMIN (GLUCOPHAGE-XR) 500 MG 24 hr tablet TAKE 2 TABLETS BY MOUTH EVERY DAY WITH BREAKFAST  . norethindrone (AYGESTIN) 5 MG tablet Take 2.5 mg by mouth daily.  . pregabalin (LYRICA) 150 MG capsule Take 150 mg by mouth at bedtime. For nerve pain   . Probiotic Product (PROBIOTIC DAILY PO) Take 1 capsule by mouth daily.  Marland Kitchen Propylene Glycol (SYSTANE BALANCE OP) Place 3 drops into both eyes daily.  Marland Kitchen topiramate (TOPAMAX) 50 MG tablet Take 1 tablet (50 mg total) by mouth daily as needed. For migraines (Patient taking differently: Take 50 mg by mouth at bedtime. For migraines)  . [DISCONTINUED] Insulin Glargine (BASAGLAR KWIKPEN) 100 UNIT/ML SOPN Inject 0.7 mLs (70 Units total) into  the skin at bedtime.   Facility-Administered Encounter Medications as of 04/01/2019  Medication  . indocyanine green (IC-GREEN) injection 75 mg    ALLERGIES: Allergies  Allergen Reactions  . Cleocin [Clindamycin Hcl] Anaphylaxis  . Latex Swelling    "SEVERE REACTION"  When received flu shot.    VACCINATION STATUS: Immunization History  Administered Date(s) Administered  . Pneumococcal Polysaccharide-23 05/28/2012    Diabetes She presents for her follow-up diabetic visit. She  has type 2 diabetes mellitus. Her disease course has been improving. There are no hypoglycemic associated symptoms. Pertinent negatives for hypoglycemia include no confusion, headaches, pallor or seizures. Pertinent negatives for diabetes include no chest pain, no fatigue, no polydipsia, no polyphagia and no polyuria. There are no hypoglycemic complications. Symptoms are improving. There are no diabetic complications. Risk factors for coronary artery disease include diabetes mellitus, dyslipidemia, obesity, sedentary lifestyle and tobacco exposure. Current diabetic treatments: She is currently taking metformin ER 1000 mg p.o. twice daily. Her weight is increasing steadily. She is following a generally unhealthy diet. When asked about meal planning, she reported none. She has had a previous visit with a dietitian. She never participates in exercise. Her breakfast blood glucose range is generally 130-140 mg/dl. Her bedtime blood glucose range is generally 140-180 mg/dl. Her overall blood glucose range is 140-180 mg/dl. (She presents with tightly controlled fasting glycemic profile and near target postprandial blood glucose profile and point-of-care A1c of 6.7%, overall improving from 8.4%.    ) An ACE inhibitor/angiotensin II receptor blocker is being taken.  Hypertension This is a chronic problem. The current episode started more than 1 year ago. The problem is controlled. Pertinent negatives include no chest pain,  headaches, palpitations or shortness of breath. Risk factors for coronary artery disease include diabetes mellitus, obesity, sedentary lifestyle and smoking/tobacco exposure. Past treatments include ACE inhibitors. Identifiable causes of hypertension include a thyroid problem.  Thyroid Problem Presents for follow-up (Patient is on levothyroxine 75 mcg p.o. nightly.) visit. Patient reports no cold intolerance, constipation, diarrhea, fatigue, heat intolerance or palpitations. The symptoms have been stable.    Review of systems:  Constitutional: + Progressive weight gain, no fatigue, no subjective hyperthermia, no subjective hypothermia Eyes: no blurry vision, no xerophthalmia ENT: no sore throat, no nodules palpated in throat, no dysphagia/odynophagia, no hoarseness Cardiovascular: no Chest Pain, no Shortness of Breath, no palpitations, no leg swelling Respiratory: no cough, no SOB Gastrointestinal: no Nausea/Vomiting/Diarhhea Musculoskeletal: no muscle/joint aches Skin: no rashes Neurological: no tremors, no numbness, no tingling, no dizziness Psychiatric: no depression, no anxiety   Objective:    BP 133/82   Pulse 75   Ht 5\' 6"  (1.676 m)   Wt 295 lb 6.4 oz (134 kg)   BMI 47.68 kg/m   Wt Readings from Last 3 Encounters:  04/01/19 295 lb 6.4 oz (134 kg)  12/28/18 290 lb (131.5 kg)  07/01/18 284 lb (128.8 kg)    Physical Exam- Limited  Constitutional:  Body mass index is 47.68 kg/m. , not in acute distress, normal state of mind Eyes:  EOMI, no exophthalmos Neck: Supple Respiratory: Adequate breathing efforts Musculoskeletal: no gross deformities, strength intact in all four extremities, no gross restriction of joint movements Skin:  no rashes, no hyperemia Neurological: no tremor with outstretched hands.   CMP     Component Value Date/Time   NA 137 12/17/2018 1346   K 4.7 12/17/2018 1346   CL 103 12/17/2018 1346   CO2 25 12/17/2018 1346   GLUCOSE 129 12/17/2018 1346    BUN 17 12/17/2018 1346   CREATININE 0.99 12/17/2018 1346   CALCIUM 9.6 12/17/2018 1346   PROT 6.8 12/17/2018 1346   ALBUMIN 3.0 (L) 07/19/2012 0555   AST 9 (L) 12/17/2018 1346   ALT 8 12/17/2018 1346   ALKPHOS 92 07/19/2012 0555   BILITOT 0.5 12/17/2018 1346   GFRNONAA 67 12/17/2018 1346   GFRAA 78 12/17/2018 1346     Diabetic Labs (most recent):  Lab Results  Component Value Date   HGBA1C 6.7 (A) 04/01/2019   HGBA1C 8.4 (A) 12/28/2018   HGBA1C 10.0 (H) 06/18/2018     Lipid Panel ( most recent) Lipid Panel     Component Value Date/Time   CHOL  11/24/2009 0634    143        ATP III CLASSIFICATION:  <200     mg/dL   Desirable  [id number]  mg/dL   Borderline High  >=469>=240    mg/dL   High          TRIG 629110 11/24/2009 0634   HDL 35 (L) 11/24/2009 0634   CHOLHDL 4.1 11/24/2009 0634   VLDL 22 11/24/2009 0634   LDLCALC  11/24/2009 0634    86        Total Cholesterol/HDL:CHD Risk Coronary Heart Disease Risk Table                     Men   Women  1/2 Average Risk   3.4   3.3  Average Risk       5.0   4.4  2 X Average Risk   9.6   7.1  3 X Average Risk  23.4   11.0        Use the calculated Patient Ratio above and the CHD Risk Table to determine the patient's CHD Risk.        ATP III CLASSIFICATION (LDL):  <100     mg/dL   Optimal  [id number]  mg/dL   Near or Above                    Optimal  130-159  mg/dL   Borderline  [phone]  mg/dL   High  >272>190     mg/dL   Very High      Lab Results  Component Value Date   TSH 2.48 12/17/2018   TSH 1.63 01/12/2018   TSH 0.88 09/28/2017   TSH 1.65 08/22/2011   TSH 4.678 (H) 11/24/2009   FREET4 0.9 12/17/2018   FREET4 1.1 01/12/2018   FREET4 1.1 09/28/2017      Assessment & Plan:   1. Uncontrolled type 2 diabetes mellitus with hyperglycemia (HCC)  - Olivia Payne has currently uncontrolled symptomatic type 2 DM since 49 years of age. -  She presents with tightly controlled fasting glycemic profile and near target  postprandial blood glucose profile and point-of-care A1c of 6.7%, overall improving from 10%.  Her logs and insulin administration records will be scanned into her record. -her diabetes is complicated by obesity/sedentary life and Olivia Payne remains at a high risk for more acute and chronic complications which include CAD, CVA, CKD, retinopathy, and neuropathy. These are all discussed in detail with the patient.  - I have counseled her on diet management and weight loss, by adopting a carbohydrate restricted/protein rich diet.  - she  admits there is a room for improvement in her diet and drink choices. -  Suggestion is made for her to avoid simple carbohydrates  from her diet including Cakes, Sweet Desserts / Pastries, Ice Cream, Soda (diet and regular), Sweet Tea, Candies, Chips, Cookies, Sweet Pastries,  Store Bought Juices, Alcohol in Excess of  1-2 drinks a day, Artificial Sweeteners, Coffee Creamer, and "Sugar-free" Products. This will help patient to have stable blood glucose profile and potentially avoid unintended weight gain.    - I encouraged her to switch to  unprocessed or minimally  processed complex starch and increased protein intake (animal or plant source), fruits, and vegetables.  - she is advised to stick to a routine mealtimes to eat 3 meals  a day and avoid unnecessary snacks ( to snack only to correct hypoglycemia).   - I have approached her with the following individualized plan to manage diabetes and patient agrees:   -Given her presentation with tightly controlled fasting glycemic profile, she is advised to lower her Basaglar to 50 units nightly, associated with strict monitoring of blood glucose 2 times a day-before meals and at bedtime. -She will not need prandial insulin for now.  -She is advised to continue metformin 500 mg ER  Twice daily- after breakfast and after supper.  -Patient is encouraged to call clinic for blood glucose levels less than 70 or above 200  mg /dlx 3 -She has benefited from the low-dose glipizide intervention.  She is advised to continue glipizide 5 mg XL p.o. daily at breakfast.     2) BP/HTN: Her blood pressure is controlled to target.    She is advised to continue her medications including lisinopril 5 mg p.o. daily.    3) Lipids/HPL:   Her recent lipid panel showed LDL at 88.  She is not on any statin medications.  She is hesitant to go on statins at this time.   4) hypothyroidism  -Her recent thyroid function test from October 2020 was consistent with appropriate replacement.  She is advised to continue levothyroxine 75 mcg p.o. daily before breakfast.     - We discussed about the correct intake of her thyroid hormone, on empty stomach at fasting, with water, separated by at least 30 minutes from breakfast and other medications,  and separated by more than 4 hours from calcium, iron, multivitamins, acid reflux medications (PPIs). -Patient is made aware of the fact that thyroid hormone replacement is needed for life, dose to be adjusted by periodic monitoring of thyroid function tests.   6) Chronic Care/Health Maintenance:  -she  is on ACEI and  is encouraged to initiate and continue to follow up with Ophthalmology, Dentist,  Podiatrist at least yearly or according to recommendations, and advised to  stay away from smoking. I have recommended yearly flu vaccine and pneumonia vaccine at least every 5 years; moderate intensity exercise for up to 150 minutes weekly; and  sleep for at least 7 hours a day.  - I advised patient to maintain close follow up with Richardean Chimera, MD for primary care needs.  - Time spent on this patient care encounter:  35 min, of which > 50% was spent in  counseling and the rest reviewing her blood glucose logs , discussing her hypoglycemia and hyperglycemia episodes, reviewing her current and  previous labs / studies  ( including abstraction from other facilities) and medications  doses and  developing a  long term treatment plan and documenting her care.   Please refer to Patient Instructions for Blood Glucose Monitoring and Insulin/Medications Dosing Guide"  in media tab for additional information. Please  also refer to " Patient Self Inventory" in the Media  tab for reviewed elements of pertinent patient history.  Olivia Payne participated in the discussions, expressed understanding, and voiced agreement with the above plans.  All questions were answered to her satisfaction. she is encouraged to contact clinic should she have any questions or concerns prior to her return visit.   Follow up plan: - Return in about 4 months (around 07/30/2019) for  Bring Meter and Logs- A1c in Office, Follow up with Pre-visit Labs.  Glade Lloyd, MD [facility] [address] [place], [place] [address] Phone: [phone]  Fax: [phone]    04/01/2019, 12:06 PM  This note was partially dictated with voice recognition software. Similar sounding words can be transcribed inadequately or may not  be corrected upon review.

## 2019-04-01 NOTE — Patient Instructions (Signed)
                                     Advice for Weight Management  -For most of us the best way to lose weight is by diet management. Generally speaking, diet management means consuming less calories intentionally which over time brings about progressive weight loss.  This can be achieved more effectively by restricting carbohydrate consumption to the minimum possible.  So, it is critically important to know your numbers: how much calorie you are consuming and how much calorie you need. More importantly, our carbohydrates sources should be unprocessed or minimally processed complex starch food items.   Sometimes, it is important to balance nutrition by increasing protein intake (animal or plant source), fruits, and vegetables.  -Sticking to a routine mealtime to eat 3 meals a day and avoiding unnecessary snacks is shown to have a big role in weight control. Under normal circumstances, the only time we lose real weight is when we are hungry, so allow hunger to take place- hunger means no food between meal times, only water.  It is not advisable to starve.   -It is better to avoid simple carbohydrates including: Cakes, Sweet Desserts, Ice Cream, Soda (diet and regular), Sweet Tea, Candies, Chips, Cookies, Store Bought Juices, Alcohol in Excess of  1-2 drinks a day, Artificial Sweeteners, Doughnuts, Coffee Creamers, "Sugar-free" Products, etc, etc.  This is not a complete list.....    -Consulting with certified diabetes educators is proven to provide you with the most accurate and current information on diet.  Also, you may be  interested in discussing diet options/exchanges , we can schedule a visit with Penny Crumpton, RDN, CDE for individualized nutrition education.  -Exercise: If you are able: 30 -60 minutes a day ,4 days a week, or 150 minutes a week.  The longer the better.  Combine stretch, strength, and aerobic activities.  If you were told in the past that you  have high risk for cardiovascular diseases, you may seek evaluation by your heart doctor prior to initiating moderate to intense exercise programs.                                  Additional Care Considerations for Diabetes   -Diabetes  is a chronic disease.  The most important care consideration is regular follow-up with your diabetes care provider with the goal being avoiding or delaying its complications and to take advantage of advances in medications and technology.    -Type 2 diabetes is known to coexist with other important comorbidities such as high blood pressure and high cholesterol.  It is critical to control not only the diabetes but also the high blood pressure and high cholesterol to minimize and delay the risk of complications including coronary artery disease, stroke, amputations, blindness, etc.    - Studies showed that people with diabetes will benefit from a class of medications known as ACE inhibitors and statins.  Unless there are specific reasons not to be on these medications, the standard of care is to consider getting one from these groups of medications at an optimal doses.  These medications are generally considered safe and proven to help protect the heart and the kidneys.    - People with diabetes are encouraged to initiate and maintain regular follow-up with eye doctors, foot doctors, dentists ,   and if necessary heart and kidney doctors.     - It is highly recommended that people with diabetes quit smoking or stay away from smoking, and get yearly  flu vaccine and pneumonia vaccine at least every 5 years.  One other important lifestyle recommendation is to ensure adequate sleep - at least 6-7 hours of uninterrupted sleep at night.  -Exercise: If you are able: 30 -60 minutes a day, 4 days a week, or 150 minutes a week.  The longer the better.  Combine stretch, strength, and aerobic activities.  If you were told in the past that you have high risk for cardiovascular  diseases, you may seek evaluation by your heart doctor prior to initiating moderate to intense exercise programs.     COVID-19 Vaccine Information can be found at: [email or web address] For questions related to vaccine distribution or appointments, please email [email or web address] or call [phone].        

## 2019-04-18 ENCOUNTER — Other Ambulatory Visit: Payer: Self-pay | Admitting: "Endocrinology

## 2019-05-08 ENCOUNTER — Other Ambulatory Visit: Payer: Self-pay | Admitting: "Endocrinology

## 2019-05-27 ENCOUNTER — Telehealth: Payer: Self-pay | Admitting: "Endocrinology

## 2019-05-27 NOTE — Telephone Encounter (Signed)
Instead of metformin , I would lower Basaglar to 40 units. Keep metformin same.

## 2019-05-27 NOTE — Telephone Encounter (Signed)
Patient had a metabolic panel done last week for her work. Her A1c was 6.5, She is asking if you could lower her metformin. She is taking 1000mg  and wants to be lowered to 750mg . She is going to email me the lab either today or Monday. Thanks

## 2019-05-30 NOTE — Telephone Encounter (Signed)
Patient said she rather change the metformin. Please advise.

## 2019-06-14 ENCOUNTER — Other Ambulatory Visit: Payer: Self-pay | Admitting: "Endocrinology

## 2019-07-07 ENCOUNTER — Other Ambulatory Visit: Payer: Self-pay | Admitting: "Endocrinology

## 2019-07-13 ENCOUNTER — Telehealth: Payer: Self-pay | Admitting: "Endocrinology

## 2019-07-13 NOTE — Telephone Encounter (Signed)
Called pt, she stated she only received a 30 day supply when she usually gets a 90 day supply. Advised pt once she uses the 30 day supply up we will send her a 90 day supply. Pt has an upcoming appointment in June.

## 2019-07-13 NOTE — Telephone Encounter (Signed)
Pt left a Vm that she needs a 90 day supply on insulin. She did not say, I assume it is the Hospital doctor. Please advise

## 2019-08-02 ENCOUNTER — Ambulatory Visit: Payer: No Typology Code available for payment source | Admitting: "Endocrinology

## 2019-09-01 ENCOUNTER — Other Ambulatory Visit: Payer: Self-pay | Admitting: "Endocrinology

## 2019-09-07 ENCOUNTER — Other Ambulatory Visit: Payer: Self-pay | Admitting: "Endocrinology

## 2019-09-09 ENCOUNTER — Other Ambulatory Visit: Payer: Self-pay | Admitting: "Endocrinology

## 2019-10-10 ENCOUNTER — Other Ambulatory Visit: Payer: Self-pay | Admitting: "Endocrinology

## 2020-07-24 ENCOUNTER — Other Ambulatory Visit (HOSPITAL_COMMUNITY): Payer: Self-pay

## 2021-02-12 ENCOUNTER — Other Ambulatory Visit (HOSPITAL_COMMUNITY): Payer: Self-pay | Admitting: Physician Assistant

## 2021-02-12 ENCOUNTER — Other Ambulatory Visit: Payer: Self-pay | Admitting: Physician Assistant

## 2021-02-12 DIAGNOSIS — R519 Headache, unspecified: Secondary | ICD-10-CM

## 2021-02-12 DIAGNOSIS — H539 Unspecified visual disturbance: Secondary | ICD-10-CM

## 2021-02-12 DIAGNOSIS — Z8774 Personal history of (corrected) congenital malformations of heart and circulatory system: Secondary | ICD-10-CM

## 2021-02-13 ENCOUNTER — Other Ambulatory Visit: Payer: Self-pay

## 2021-02-13 ENCOUNTER — Ambulatory Visit (HOSPITAL_COMMUNITY)
Admission: RE | Admit: 2021-02-13 | Discharge: 2021-02-13 | Disposition: A | Discharge status: Home / Self Care | Payer: No Typology Code available for payment source | Source: Ambulatory Visit | Attending: Physician Assistant | Admitting: Physician Assistant

## 2021-02-13 DIAGNOSIS — Z8774 Personal history of (corrected) congenital malformations of heart and circulatory system: Secondary | ICD-10-CM

## 2021-02-13 DIAGNOSIS — R519 Headache, unspecified: Secondary | ICD-10-CM

## 2021-02-13 DIAGNOSIS — H539 Unspecified visual disturbance: Secondary | ICD-10-CM

## 2021-02-26 ENCOUNTER — Ambulatory Visit (INDEPENDENT_AMBULATORY_CARE_PROVIDER_SITE_OTHER): Payer: No Typology Code available for payment source | Admitting: Neurology

## 2021-02-26 ENCOUNTER — Encounter: Payer: Self-pay | Admitting: Neurology

## 2021-02-26 VITALS — BP 152/76 | HR 80 | Ht 66.0 in | Wt 300.0 lb

## 2021-02-26 DIAGNOSIS — Q282 Arteriovenous malformation of cerebral vessels: Secondary | ICD-10-CM | POA: Diagnosis not present

## 2021-02-26 DIAGNOSIS — Q283 Other malformations of cerebral vessels: Secondary | ICD-10-CM

## 2021-02-26 MED ORDER — TOPIRAMATE 100 MG PO TABS: 100.0000 mg | ORAL_TABLET | Freq: Every day | ORAL | 4 refills | Status: DC

## 2021-02-26 NOTE — Progress Notes (Signed)
[facility]  PATIENT: Olivia Payne DOB: January 13, 1971  REQUESTING CLINICIAN: Royann Shivers, * HISTORY FROM: Patient and mother  REASON FOR VISIT: Brain fog/difficulty with concentration/ word finding difficulty   HISTORICAL  CHIEF COMPLAINT:  Chief Complaint  Patient presents with   New Patient (Initial Visit)    Room 15, with mother, NP/[employer]/[facility]/Katherine Skillman PA [phone] hx of cranioplasty Today c/o daily morning headaches, difficulty finding words, forgetfulness. Sx started last month after hospital visit.         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  Patient reported doing well prior to her AVM rupture.  In 2014 she was noted to have a right parietal AVM which was later resected.  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  She reported from April to November of the year she has been sick with multiple infections.  During this time, she has developed difficulty concentrating, she reports she cannot focus, she cannot think straight, she had difficulty reading email at work and not understanding what is going on.  She said that her work performance has markedly decreased to the point that her boss called her to tell her that she is not the same person anymore and then she need to go on disability. She was terminated from her job.  On top of her difficulty of work she also complains of daily headaches.  She had a history of migraine, was initially put on topiramate and Lyrica, since  then her migraine headaches has improved but now she is getting morning headaches.  She reported headaches usually happen to her when people start to talk to heror she will start doing some work upon waking up.  But if left alone she will not develop any headache in the morning. She also reports a lot of stress, she is not bankruptcy, she reported "everybody drives and driving her crazy", she is more forgetful than before and even she forgets to take her meds.  She has follow-up with her primary care doctor and is pending a neuropsych evaluation.  She does not have a regular psychiatrist or therapist. Patient also reported history of stroke and a history of aneurysm repair with a clip.  Per chart review via epic, I do not see any history of aneurysm repair with a clip.  She had a IR angiogram in 2017 without mention of any intracranial aneurysm.   She was told that she cannot do a brain MRI due to MRI tech not knowing what type of aneurysm clip she had.  Patient does not know which type she have and she reported that she does not have the card, very difficult to verify if the clip is MRI compatible.  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     Headache History and Characteristics: Onset: 2014 after cranioplasty  Location: right side but still all over  Quality:  pulsating throbbing pain  Intensity: /10.  Duration: all day  Migrainous Features: None.  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   Ketoconazole     HOME MEDICATIONS: Outpatient Medications Prior to Visit  Medication Sig Dispense Refill   albuterol (PROVENTIL HFA;VENTOLIN HFA) 108 (90 BASE) MCG/ACT inhaler Inhale 2 puffs into the lungs every 6 (six) hours as needed for wheezing.     B-D ULTRAFINE III SHORT PEN 31G X 8 MM MISC USE WITH TRESIBA AT BEDTIME 100 each 3   cetirizine (ZYRTEC) 10 MG tablet Take 10 mg by mouth daily.     escitalopram (LEXAPRO) 20 MG tablet Take 20 mg by mouth daily.     glipiZIDE (GLUCOTROL XL) 5 MG 24 hr tablet TAKE 1 TABLET BY MOUTH EVERY DAY WITH BREAKFAST 30 tablet 0   levothyroxine (SYNTHROID, LEVOTHROID) 75 MCG tablet Take 75 mcg by mouth daily before breakfast.     lisinopril (PRINIVIL,ZESTRIL) 5 MG tablet Take 5 mg by mouth daily.     LORazepam (ATIVAN) 1 MG tablet Take 2 tablets (2 mg total) by mouth 2 (two) times daily. 60 tablet 0   metFORMIN (GLUCOPHAGE-XR) 500 MG 24 hr tablet TAKE 1 TABLET (500 MG TOTAL) BY MOUTH 2 (TWO) TIMES DAILY AFTER A MEAL. 60 tablet 0   norethindrone (AYGESTIN) 5 MG tablet Take 2.5 mg by mouth daily.     pregabalin (LYRICA) 150 MG capsule Take 150 mg by mouth at bedtime. For nerve pain      Propylene Glycol (SYSTANE BALANCE OP) Place 3 drops into both eyes daily.     topiramate (TOPAMAX) 50 MG tablet Take 1 tablet (50 mg total) by mouth daily as needed. For migraines (Patient taking differently: Take 50 mg by mouth at bedtime. For migraines) 30 tablet 0   [employer] D3 125 MCG (5000 UT) capsule TAKE 1 CAPSULE (5,000 UNITS  TOTAL) BY MOUTH DAILY. 90 capsule 0   Insulin Glargine (BASAGLAR KWIKPEN) 100 UNIT/ML INJECT 0.5 MLS (50 UNITS TOTAL) INTO THE SKIN AT BEDTIME. 30 mL 0   Probiotic Product (PROBIOTIC DAILY PO) Take 1 capsule by mouth daily.     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    Cerebral aneurysm    Cerebral AVM    Depression    Diabetes mellitus with other specified manifestations    peripheral neuropathy   Facial pain    since intubation  GERD (gastroesophageal reflux disease)    hx   History of chicken pox    Hypertension    Hypothyroidism    Migraine    Neuropathy    OA (osteoarthritis) of knee    left knee with chronic pain   Sleep apnea    does not use Cpap   Thyroid disease     PAST SURGICAL HISTORY: Past Surgical History:  Procedure Laterality Date   cerebral arteriogram     CESAREAN SECTION  1995   CRANIOPLASTY Right 07/09/2012   Procedure: CRANIOPLASTY;  Surgeon: Carmela Hurt, MD;  Location: [facility];  Service: Neurosurgery;  Laterality: Right;  Cranioplasty with bone retrieval from abdominal pocket   CRANIOTOMY Right 05/26/2012   Procedure: CRANIECTOMY INTRACRANIAL ARTERIO-VENOUS MALFORMATION DURAL COMPLEX (AVM). PLACEMENT OF BONE FLAB IN ABDOMINAL WALL;  Surgeon: Carmela Hurt, MD;  Location: [facility];  Service: Neurosurgery;  Laterality: Right;  RIGHT Craniectomy for arteriovenous malformation resection   DILATION AND CURETTAGE OF UTERUS  06/07/2002   EYE SURGERY  01/12/2013   laser    IR GENERIC HISTORICAL  02/12/2016   IR ANGIO INTRA EXTRACRAN SEL COM CAROTID INNOMINATE BILAT MOD SED 02/12/2016 Julieanne Cotton, MD [facility]    IR GENERIC HISTORICAL  02/12/2016   IR ANGIO VERTEBRAL SEL SUBCLAVIAN INNOMINATE UNI L MOD SED 02/12/2016 Julieanne Cotton, MD [facility]   IR GENERIC HISTORICAL  02/12/2016   IR ANGIO VERTEBRAL SEL VERTEBRAL UNI R MOD SED 02/12/2016 Julieanne Cotton, MD [facility]   RADIOLOGY WITH ANESTHESIA N/A 04/20/2012   Procedure: RADIOLOGY WITH ANESTHESIA;  Surgeon: Oneal Grout, MD;  Location: [facility];  Service: Radiology;  Laterality: N/A;   RADIOLOGY WITH ANESTHESIA N/A 05/26/2012   Procedure: RADIOLOGY WITH ANESTHESIA;  Surgeon: Oneal Grout, MD;  Location: [facility];  Service: Radiology;  Laterality: N/A;    FAMILY HISTORY: Family History  Problem Relation Age of Onset   Diabetes Father    Heart disease Father    Heart attack Father 25   Hypertension Father    Heart failure Father    Cancer Mother        Breast Cancer   Cancer Paternal Aunt        Ovarian Cancer   Diabetes Paternal Grandmother    Cancer Other        Lung Cancer-Maternal side of family   High Cholesterol Sister    Cancer Maternal Grandmother     SOCIAL HISTORY: Social History   Socioeconomic History   Marital status: Married    Spouse name: Not on file   Number of children: 2   Years of education: Not on file   Highest education level: Not on file  Occupational History   Occupation: ANALYST    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[employer]  Tobacco Use   Smoking status: Former    Packs/day: 1.50    Years: 26.00    Pack years: 39.00    Types: Cigarettes    Quit date: 07/08/2003    Years since quitting: 17.6   Smokeless tobacco: Never   Tobacco comments:    quit smoking in 2005  Vaping Use   Vaping Use: Never used  Substance and Sexual Activity   Alcohol use: No   Drug use: No   Sexual activity: Not Currently  Other Topics Concern   Not on file  Social History Narrative   Caffeine Use-no         Social Determinants of Health  Financial Resource Strain: Not on file  Food Insecurity: Not  on file  Transportation Needs: Not on file  Physical Activity: Not on file  Stress: Not on file  Social Connections: Not on file  Intimate Partner Violence: Not on file    PHYSICAL EXAM   GENERAL EXAM/CONSTITUTIONAL: Vitals:  Vitals:   02/26/21 0942  BP: (!) 152/76  Pulse: 80  Weight: 300 lb (136.1 kg)  Height: 5\' 6"  (1.676 m)   Body mass index is 48.42 kg/m. Wt Readings from Last 3 Encounters:  02/26/21 300 lb (136.1 kg)  04/01/19 295 lb 6.4 oz (134 kg)  12/28/18 290 lb (131.5 kg)   Patient is in no distress; well developed, nourished and groomed; neck is supple  CARDIOVASCULAR: Examination of carotid arteries is normal; no carotid bruits Regular rate and rhythm, no murmurs Examination of peripheral vascular system by observation and palpation is normal  EYES: Pupils round and reactive to light, Visual fields full to confrontation, Extraocular movements intacts,   MUSCULOSKELETAL: Gait, strength, tone, movements noted in Neurologic exam below  NEUROLOGIC: MENTAL STATUS:  No flowsheet data found. awake, alert, oriented to person, place and time recent and remote memory intact Decrease attention and concentration  language fluent, comprehension intact, naming intact fund of knowledge appropriate  CRANIAL NERVE:  2nd, 3rd, 4th, 6th - pupils equal and reactive to light, visual fields full to confrontation, extraocular muscles intact, no nystagmus 5th - facial sensation symmetric 7th - facial strength symmetric 8th - hearing intact 9th - palate elevates symmetrically, uvula midline 11th - shoulder shrug symmetric 12th - tongue protrusion midline  MOTOR:  normal bulk and tone, full strength in the BUE, BLE  SENSORY:  normal and symmetric to light touch, pinprick, temperature, vibration  COORDINATION:  finger-nose-finger, fine finger movements normal  REFLEXES:  deep tendon reflexes present and symmetric  GAIT/STATION:  normal    DIAGNOSTIC DATA  (LABS, IMAGING, TESTING) - I reviewed patient records, labs, notes, testing and imaging myself where available.  Lab Results  Component Value Date   WBC 12.4 (H) 02/12/2016   HGB 13.1 02/12/2016   HCT 40.1 02/12/2016   MCV 76.2 (L) 02/12/2016   PLT 315 02/12/2016      Component Value Date/Time   NA 137 12/17/2018 1346   K 4.7 12/17/2018 1346   CL 103 12/17/2018 1346   CO2 25 12/17/2018 1346   GLUCOSE 129 12/17/2018 1346   BUN 17 12/17/2018 1346   CREATININE 0.99 12/17/2018 1346   CALCIUM 9.6 12/17/2018 1346   PROT 6.8 12/17/2018 1346   ALBUMIN 3.0 (L) 07/19/2012 0555   AST 9 (L) 12/17/2018 1346   ALT 8 12/17/2018 1346   ALKPHOS 92 07/19/2012 0555   BILITOT 0.5 12/17/2018 1346   GFRNONAA 67 12/17/2018 1346   GFRAA 78 12/17/2018 1346   Lab Results  Component Value Date   CHOL  11/24/2009    143        ATP III CLASSIFICATION:  <200     mg/dL   Desirable  11/26/2009  mg/dL   Borderline High  825-053    mg/dL   High          HDL 35 (L) 11/24/2009   LDLCALC  11/24/2009    86        Total Cholesterol/HDL:CHD Risk Coronary Heart Disease Risk Table                     Men   Women  1/2 Average Risk   3.4   3.3  Average Risk       5.0   4.4  2 X Average Risk   9.6   7.1  3 X Average Risk  23.4   11.0        Use the calculated Patient Ratio above and the CHD Risk Table to determine the patient's CHD Risk.        ATP III CLASSIFICATION (LDL):  <100     mg/dL   Optimal  161-096  mg/dL   Near or Above                    Optimal  130-159  mg/dL   Borderline  045-409  mg/dL   High  >811     mg/dL   Very High   TRIG 914 11/24/2009   CHOLHDL 4.1 11/24/2009   Lab Results  Component Value Date   HGBA1C 6.7 (A) 04/01/2019   Lab Results  Component Value Date   VITAMINB12 468 01/12/2018   Lab Results  Component Value Date   TSH 2.48 12/17/2018    Head CT 07/2012:  1.  Status post right frontal parietal cranioplasty.  2.  Interval resection of the majority of a right  frontal and parietal AVM.  There is some residual intravascular coil material along the inferior right parietal and temporal lobe.  3.  Cortical and subcortical hemorrhage adjacent to the resection cavity extending into the white matter.  While this may be postoperative in nature, venous infarction is also considered.  4.  Midline shift of 4-5 mm.    ASSESSMENT AND PLAN  50 y.o. year old female with past medical history including AVM s/p repair, diabetes mellitus type 2, headaches, hypothyroidism who is presenting with memory problem, difficulty concentrating, difficulty finishing at work and new daily headaches.  Patient reported difficulty finishing at work, concentrating at work, her work performance have markedly decreased to the point that she was terminated and was told to go into disability.  On top of that she also report of new daily headache which is different from her migraine headache.  There is also presence of extreme stress.  On exam she was noted to have decreased attention and concentration.  She does report a  history of stroke and a aneurysm which was repaired with a clip.  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  Due to her reported history of aneurysm clip she is unable to get MRI because it has been difficult to verify what type of clip she had and if it is MRI compatible.  I will order a CT head with and without contrast, her last BUN and creatinine done on November 18 was within normal limits.  For her headaches, I will increase her topiramate to 100 mg nightly and continue Lyrica at the same dose.  I explained to the patient that her headaches can be secondary to stress, poor sleep and untreated sleep apnea.  Per chart review she does have a history of sleep apnea but is not on CPAP.  Due to the history of AVM repair and difficulty with concentration, brain fog I will also obtain a routine EEG for further  evaluation.   For her memory Problem and difficulty with concentration,I explained to the patient that the CT scan may give [place] some additional information but if not then another explanation  is stress related.  Again she reported extreme stress, going through bankruptcy.  She is also pending a full neuropsychological evaluation that will help [place] in determining the etiology of her symptoms.  I will contact the patient to go over the CT result but I will see her for follow-up in 6 months after completion of her neuropsych testing.  Advised her to call or return sooner if worse.   1. Other malformations of cerebral vessels   2. AVM (arteriovenous malformation) brain      Patient Instructions  CT head with contrast (last outside  lab 11/18 with BUN10 and creatinine 0.80) Routine EEG  Increase Topiramate to 100 mg nightly  Follow up with your neuropsychiatrist as scheduled (New referral given to [facility] department)  Follow up with your primary care doctor Return to clinic in 6 months     Orders Placed This Encounter  Procedures   CT HEAD W & WO CONTRAST ( )   Ambulatory referral to Neuropsychology   EEG adult    Meds ordered this encounter  Medications   topiramate (TOPAMAX) 100 MG tablet    Sig: Take 1 tablet (100 mg total) by mouth daily. For migraines    Dispense:  90 tablet    Refill:  4    Return in about 6 months (around 08/27/2021).    Windell Norfolk, MD 02/26/2021, 9:23 PM  [facility] [address] [place], [place] [address] [phone]

## 2021-02-26 NOTE — Patient Instructions (Addendum)
CT head with contrast (last outside  lab 11/18 with BUN10 and creatinine 0.80) Routine EEG  Increase Topiramate to 100 mg nightly  Follow up with your neuropsychiatrist as scheduled (New referral given to [facility] department)  Follow up with your primary care doctor Return to clinic in 6 months

## 2021-02-28 ENCOUNTER — Telehealth: Payer: Self-pay | Admitting: Neurology

## 2021-02-28 NOTE — Telephone Encounter (Signed)
Referral has been sent to [facility]   Neuropsychology - [place]. Phone: [phone]. °

## 2021-03-06 ENCOUNTER — Other Ambulatory Visit: Payer: No Typology Code available for payment source | Admitting: *Deleted

## 2021-03-14 ENCOUNTER — Ambulatory Visit: Payer: No Typology Code available for payment source | Admitting: Neurology

## 2021-03-14 DIAGNOSIS — R410 Disorientation, unspecified: Secondary | ICD-10-CM

## 2021-03-14 DIAGNOSIS — Q283 Other malformations of cerebral vessels: Secondary | ICD-10-CM

## 2021-03-14 DIAGNOSIS — Q282 Arteriovenous malformation of cerebral vessels: Secondary | ICD-10-CM

## 2021-03-14 NOTE — Procedures (Signed)
° ° °  History:  51 year old woman with history of right frontal AVM s/p resection and now Brain fog   EEG classification: Awake and drowsy  Description of the recording: The background rhythms of this recording consists of a fairly well modulated medium amplitude alpha rhythm of 12 Hz with intermixed beta frequencies that is reactive to eye opening and closure. As the record progresses, the patient appears to remain in the waking state throughout the recording. Photic stimulation was performed, did not show any abnormalities. Hyperventilation was also performed, did not show any abnormalities. Toward the end of the recording, the patient enters the drowsy state with slight symmetric slowing seen. The patient never enters stage II sleep. No abnormal epileptiform discharges seen during this recording. There was no focal slowing. There is presence of breach rhythm in the right parietal region. EKG monitor shows no evidence of cardiac rhythm abnormalities with a heart rate of 78.  Impression: This is a normal EEG recording in the waking and drowsy state. There is presence of breach rhythm in the right parietal region. No evidence of interictal epileptiform discharges seen. A normal EEG does not exclude a diagnosis of epilepsy.    Alric Ran, MD [facility]

## 2021-03-15 ENCOUNTER — Ambulatory Visit
Admission: RE | Admit: 2021-03-15 | Discharge: 2021-03-15 | Disposition: A | Discharge status: Home / Self Care | Payer: No Typology Code available for payment source | Source: Ambulatory Visit | Attending: Neurology | Admitting: Neurology

## 2021-03-15 DIAGNOSIS — Q283 Other malformations of cerebral vessels: Secondary | ICD-10-CM

## 2021-03-15 MED ORDER — IOPAMIDOL (ISOVUE-300) INJECTION 61%
75.0000 mL | Freq: Once | INTRAVENOUS | Status: AC | PRN
  Administered 2021-03-15: 75 mL via INTRAVENOUS

## 2021-03-18 ENCOUNTER — Telehealth: Payer: Self-pay | Admitting: Neurology

## 2021-03-18 NOTE — Telephone Encounter (Signed)
Call patient discussed recent CT results showing stable postoperative changes of right frontoparietal craniotomy for AVM resection with  changes and underlying encephalomalacia in the right posterior parietal region.  No acute abnormalities noted.  No abnormal enhancement is noted after contrast to suggest recurrent AVM. All question answered. She reported completing her neuropsychological testing and should be having the result by next week.  Patient will call us to update [place] once the results are available and she would like to get a sooner appointment in 73-month from now.  I informed the patient that after reviewing the neuropsychological testing I will bring her sooner for follow-up.   Windell Norfolk, MD

## 2021-03-19 DIAGNOSIS — F411 Generalized anxiety disorder: Secondary | ICD-10-CM | POA: Insufficient documentation

## 2021-03-19 DIAGNOSIS — R4189 Other symptoms and signs involving cognitive functions and awareness: Secondary | ICD-10-CM | POA: Insufficient documentation

## 2021-03-19 DIAGNOSIS — F332 Major depressive disorder, recurrent severe without psychotic features: Secondary | ICD-10-CM | POA: Insufficient documentation

## 2021-04-13 ENCOUNTER — Encounter: Payer: Self-pay | Admitting: Neurology

## 2021-04-13 NOTE — Progress Notes (Signed)
NEUROPSYCHOLOGICAL TESTING DONE AT [facility]: SUMMARY & IMPRESSION:  Mrs. 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   Current test results revealed reduced (if not exceptionally low) performance across most cognitive domains and thinking skills assessed during this evaluation without major exception. From an emotional standpoint, on multiple self-report measures, there are indications of a severe degree of recent depression, anxiety and panic-like symptoms. However, Mrs. Bellucci scores on several measures that are used to assess the validity of the test results were outside of expected limits, which makes it hard for [place] to accurately assess the patient's thinking skills and ability to perform daily activities. Moreover, the test results should be interpreted with caution as they may represent an inaccurate assessment of the patients actual cognitive abilities. It should be made clear that we are not surmising that there was any intentional feigning of cognitive impairment. Rather, it could be that emotional factors (e.g. severe anxiety and depression) may have contributed toward an inability to provide consistent effort. Unfortunately, given these circumstances, this limits our ability to provide certain recommendations or speak to whether true cognitive impairment exists.   Despite any validity issues, it is our opinion that severe mood disturbance is at least contributing to some of the cognitive challenges reported, if not a combination of that plus other factors (e.g., complex medical history, polypharmacy). As such, it is our hope that with amelioration of her psychiatric symptoms, and as many other variables as possible, that her challenges will improve and/or abate. If not, then other etiologies can be  entertained.   REFERRING DIAGNOSIS:  Memory changes  FINAL DIAGNOSES (ICD-10 considerations):  Cognitive complaints  Major Depressive Disorder, recurrent, severe without psychotic symptoms Generalized Anxiety Disorder Panic disorder  RECOMMENDATIONS: 1. Follow-up with neurology.   Encourage aggressive treatment for severe mood disturbance via medications and counseling. An intensive outpatient program may be valuable.   Patient wished to be referred for occupational therapy as it helped her return to work after her prior surgery. I see no difficulty with her PCP referring her if she so desires.   Patient is encouraged to discuss the current medication regimen with prescribing providers. There are several medications that are prescribed that can cause problems with memory and thinking. Of course, there is a cost-benefit analysis that must be considered carefully and it may not be possible to treat all of the patients symptoms without some cognitive side effects.  Could consider ordering labs to assess for other possible etiologies of cognitive disruption (e.g., TSH, vitamin B-12, methylmalonic acid, homocysteine, RPR, folate, vitamin D).   It is recommended that the patient strictly comply with prescribed medical treatments for cerebrovascular risk factors (e.g., high cholesterol, high blood pressure, sleep apnea, diabetes).  2. This individual appears to be experiencing clinical symptoms of severe depression and anxiety. As such, engaging in psychotherapy is strongly recommended to better manage mood symptomology. Therein the patient can work on identifying triggers for depression and anxiety as well as work towards developing healthy coping mechanisms and implementing lifestyle modifications that can help to support ongoing mental health.   3. The psychiatric symptoms noted put the patient at a vocational disadvantage.   4. The patient is encouraged to attend to lifestyle factors for brain  health (e.g., regular physical exercise, good nutrition habits, regular participation in cognitively-stimulating activities, and general stress management techniques), which are likely to have  benefits for both emotional adjustment and cognition. In fact, in addition to promoting general good health, regular exercise incorporating aerobic activities (e.g., brisk walking, jogging, bicycling, etc.) has been demonstrated to be a very effective treatment for depression and stress, with similar efficacy rates to both antidepressant medication and psychotherapy. And for those with orthopedic issues, water aerobics may be particularly beneficial.  5. Nutritional factors can have a significant effect on psychological and emotional status, as well as overall brain functioning. The following general recommendations have been associated with improvements in depression and other psychological symptoms, as well as lower risk for dementia and other forms of cognitive impairment. Please discuss these recommendations with your physician and/or dietitian before initiating:   Consume a wide variety of fresh fruits and vegetables, particularly including brightly colored items such as berries, oranges, tomatoes, peppers, carrots, broccoli, spinach, dark green lettuces, sweet potatoes, etc., all of which are high in vitamins and antioxidants.   Consume foods that are high in fiber, such as legumes (e.g., beans, peas, lentils) and foods made from whole grains (e.g., whole wheat bread and pasta)   Consume a significant amount of omega-3 essential fats and oils. These can be found in natural food sources such as salmon and other fatty fish, and also products made from flax seed and flax seed oil. Alternately, dietary supplementation with fish oil capsules and flax seed oil capsules is a good way to boost ones level of omega-3 consumption. It is important to check with your doctor before taking these supplements, especially if you  take blood thinning medication.   If you do not already do so, consider taking a quality multivitamin supplement under the guidance of your primary care physician.    Consider keeping consumption of the following foods to a minimum: 1) foods made from white flour and white sugar; 2) artificial sweeteners; 3) deep-fried foods; 4) animal fat other than fish; 5) any foods containing hydrogenated or trans fats; 6) most other types of highly processed packaged/prepared foods.   6. Neuropsychological re-evaluation is recommended as needed to monitor treatment efficacy, to assist with the management of the patient (start or continue rehab or pharmacological therapy), to determine any clinical and functional significance of brain abnormality over time, as well as to document any potential improvement or decline in cognitive functioning. Lastly, any follow-up testing will help delineate the specific cognitive basis of any new functional complaints. If you wish to make a follow-up appointment, please contact our office at [phone].  The following are several strategies that may help:   Performance will generally be best in a structured, routine, and familiar environment, as opposed to situations involving complex problems.   Designate a place to keep your keys, wallet, cell phone, and other personal belongings.  Take time to register and process information to be remembered. Deeper encoding of information can be gained by forming a mental picture, making meaningful associations, connecting new information to previously learned and related information, paraphrasing and repetition.   To the extent possible, multitasking should be avoided; break down tasks into smaller steps to help get started and to keep from feeling overwhelmed. And if there are difficulties in organization and planning, maintaining a daily organizer to help keep track of important appointments and information may be beneficial.   Memory  problems may at least be minimally addressed using compensatory strategies such as the use of a daily schedule to follow, memos, portable recorder, a centrally located bulletin board, or memory notebook. A large calendar,  placed in a highly visible location would be valuable to keep track of dates and appointments. In addition, it would be helpful to keep a log of all of medical appointments with the name of the doctor, date of visit, diagnoses, and treatments.   Use of a medication box is recommended to ensure compliance and decrease confusion regarding medication dosages, times, and dates.  To aid in attention, the patient may consider using some of the following strategies: o The patient should simplify tasks. There may be a need to break overly complex activities into simple step-by-step tasks, keep these steps written down in a note book and then check them off as they are completed which will help to stay on task and make sure the whole task is finished.  o The patient should set deadlines for everything, even for seemingly small tasks, prioritize time-sensitive tasks and write down every assignment, message, or important thought. o The patient is encouraged to use timers and alarms to stay on track and take breaks at regular intervals. Avoid piles of paperwork or procrastination by dealing with each item as it comes in.

## 2021-04-25 ENCOUNTER — Encounter: Payer: Self-pay | Admitting: Psychiatry

## 2021-04-25 ENCOUNTER — Other Ambulatory Visit: Payer: Self-pay

## 2021-04-25 ENCOUNTER — Ambulatory Visit (INDEPENDENT_AMBULATORY_CARE_PROVIDER_SITE_OTHER): Payer: No Typology Code available for payment source | Admitting: Psychiatry

## 2021-04-25 ENCOUNTER — Other Ambulatory Visit
Admission: RE | Admit: 2021-04-25 | Discharge: 2021-04-25 | Disposition: A | Discharge status: Home / Self Care | Payer: No Typology Code available for payment source | Source: Home / Self Care | Attending: Psychiatry | Admitting: Psychiatry

## 2021-04-25 ENCOUNTER — Ambulatory Visit
Admission: RE | Admit: 2021-04-25 | Discharge: 2021-04-25 | Disposition: A | Discharge status: Home / Self Care | Payer: No Typology Code available for payment source | Source: Ambulatory Visit | Attending: Psychiatry | Admitting: Psychiatry

## 2021-04-25 VITALS — BP 139/89 | HR 105 | Temp 98.3°F | Wt 296.4 lb

## 2021-04-25 DIAGNOSIS — F410 Panic disorder [episodic paroxysmal anxiety] without agoraphobia: Secondary | ICD-10-CM | POA: Diagnosis not present

## 2021-04-25 DIAGNOSIS — F411 Generalized anxiety disorder: Secondary | ICD-10-CM

## 2021-04-25 DIAGNOSIS — F333 Major depressive disorder, recurrent, severe with psychotic symptoms: Secondary | ICD-10-CM | POA: Diagnosis present

## 2021-04-25 DIAGNOSIS — Z9189 Other specified personal risk factors, not elsewhere classified: Secondary | ICD-10-CM | POA: Insufficient documentation

## 2021-04-25 DIAGNOSIS — Z79899 Other long term (current) drug therapy: Secondary | ICD-10-CM

## 2021-04-25 LAB — HEPATIC FUNCTION PANEL
ALT: 18 U/L (ref 0–44)
AST: 16 U/L (ref 15–41)
Albumin: 3.5 g/dL (ref 3.5–5.0)
Alkaline Phosphatase: 97 U/L (ref 38–126)
Bilirubin, Direct: <0.1 mg/dL (ref 0.0–0.2)
Total Bilirubin: 0.7 mg/dL (ref 0.3–1.2)
Total Protein: 7.5 g/dL (ref 6.5–8.1)

## 2021-04-25 MED ORDER — DIVALPROEX SODIUM 250 MG PO DR TAB: 250.0000 mg | DELAYED_RELEASE_TABLET | Freq: Three times a day (TID) | ORAL | 1 refills | Status: DC

## 2021-04-25 NOTE — Patient Instructions (Signed)
Please start taking Depakote 250 mg three times a day. Please go to lab 5 days after starting Depakote , go first in the AM prior to taking AM medications and get Depakote level done . I will give a separate lab slip for Depakote level.  Please get EKG by calling [phone] today .   Once I review EKG , will consider starting Seroquel at bedtime.  Valproic Acid, Divalproex Sodium delayed or extended-release tablets What is this medication? DIVALPROEX SODIUM (dye VAL pro ex  SO dee um) is used to prevent seizures caused by some forms of epilepsy. It is also used to treat bipolar mania and to prevent migraine headaches. This medicine may be used for other purposes; ask your health care provider or pharmacist if you have questions. COMMON BRAND NAME(S): Depakote, Depakote ER What should I tell my care team before I take this medication? They need to know if you have any of these conditions: if you often drink alcohol kidney disease liver disease low platelet counts mitochondrial disease suicidal thoughts, plans, or attempt; a previous suicide attempt by you or a family member urea cycle disorder (UCD) an unusual or allergic reaction to divalproex sodium, sodium valproate, valproic acid, other medicines, foods, dyes, or preservatives pregnant or trying to get pregnant breast-feeding How should I use this medication? Take this medicine by mouth with a drink of water. Follow the directions on the prescription label. Do not cut, crush or chew this medicine. You can take it with or without food. If it upsets your stomach, take it with food. Take your medicine at regular intervals. Do not take it more often than directed. Do not stop taking except on your doctor's advice. A special MedGuide will be given to you by the pharmacist with each prescription and refill. Be sure to read this information carefully each time. Talk to your pediatrician regarding the use of this medicine in children. While  this drug may be prescribed for children as young as 10 years for selected conditions, precautions do apply. Overdosage: If you think you have taken too much of this medicine contact a poison control center or emergency room at once. NOTE: This medicine is only for you. Do not share this medicine with others. What if I miss a dose? If you miss a dose, take it as soon as you can. If it is almost time for your next dose, take only that dose. Do not take double or extra doses. What may interact with this medication? Do not take this medicine with any of the following medications: sodium phenylbutyrate This medicine may also interact with the following medications: aspirin certain antibiotics like ertapenem, imipenem, meropenem certain medicines for depression, anxiety, or psychotic disturbances certain medicines for seizures like carbamazepine, clonazepam, diazepam, ethosuximide, felbamate, lamotrigine, phenobarbital, phenytoin, primidone, rufinamide, topiramate certain medicines that treat or prevent blood clots like warfarin cholestyramine female hormones, like estrogens and birth control pills, patches, or rings propofol rifampin ritonavir tolbutamide zidovudine This list may not describe all possible interactions. Give your health care provider a list of all the medicines, herbs, non-prescription drugs, or dietary supplements you use. Also tell them if you smoke, drink alcohol, or use illegal drugs. Some items may interact with your medicine. What should I watch for while using this medication? Tell your doctor or health care provider if your symptoms do not get better or they start to get worse. This medicine may cause serious skin reactions. They can happen weeks to months after  starting the medicine. Contact your health care provider right away if you notice fevers or flu-like symptoms with a rash. The rash may be red or purple and then turn into blisters or peeling of the skin. Or, you  might notice a red rash with swelling of the face, lips or lymph nodes in your neck or under your arms. Wear a medical ID bracelet or chain, and carry a card that describes your disease and details of your medicine and dosage times. You may get drowsy, dizzy, or have blurred vision. Do not drive, use machinery, or do anything that needs mental alertness until you know how this medicine affects you. To reduce dizzy or fainting spells, do not sit or stand up quickly, especially if you are an older patient. Alcohol can increase drowsiness and dizziness. Avoid alcoholic drinks. This medicine can make you more sensitive to the sun. Keep out of the sun. If you cannot avoid being in the sun, wear protective clothing and use sunscreen. Do not use sun lamps or tanning beds/booths. Patients and their families should watch out for new or worsening depression or thoughts of suicide. Also watch out for sudden changes in feelings such as feeling anxious, agitated, panicky, irritable, hostile, aggressive, impulsive, severely restless, overly excited and hyperactive, or not being able to sleep. If this happens, especially at the beginning of treatment or after a change in dose, call your health care provider. Women should inform their doctor if they wish to become pregnant or think they might be pregnant. There is a potential for serious side effects to an unborn child. Talk to your health care provider or pharmacist for more information. Women who become pregnant while using this medicine may enroll in the [place] Pregnancy Registry by calling [phone]. This registry collects information about the safety of antiepileptic drug use during pregnancy. This medicine may cause a decrease in folic acid and vitamin D. You should make sure that you get enough vitamins while you are taking this medicine. Discuss the foods you eat and the vitamins you take with your health care provider. What side  effects may I notice from receiving this medication? Side effects that you should report to your doctor or health care professional as soon as possible: allergic reactions like skin rash, itching or hives, swelling of the face, lips, or tongue changes in vision rash, fever, and swollen lymph nodes redness, blistering, peeling or loosening of the skin, including inside the mouth signs and symptoms of liver injury like dark yellow or brown urine; general ill feeling or flu-like symptoms; light-colored stools; loss of appetite; nausea; right upper belly pain; unusually weak or tired; yellowing of the eyes or skin suicidal thoughts or other mood changes unusual bleeding or bruising Side effects that usually do not require medical attention (report to your doctor or health care professional if they continue or are bothersome): constipation diarrhea dizziness hair loss headache loss of appetite weight gain This list may not describe all possible side effects. Call your doctor for medical advice about side effects. You may report side effects to FDA at 1-800-FDA-1088. Where should I keep my medication? Keep out of reach of children. Store at room temperature between 15 and 30 degrees C (59 and 86 degrees F). Keep container tightly closed. Throw away any unused medicine after the expiration date. NOTE: This sheet is a summary. It may not cover all possible information. If you have questions about this medicine, talk to your doctor, pharmacist, or  health care provider.  2022 [employer] Standard (2018-06-02 00:00:00)
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
## 2021-04-26 MED ORDER — QUETIAPINE FUMARATE 25 MG PO TABS: 25.0000 mg | ORAL_TABLET | Freq: Every day | ORAL | 0 refills | Status: DC

## 2021-04-29 ENCOUNTER — Encounter: Payer: Self-pay | Admitting: Neurology

## 2021-04-29 ENCOUNTER — Ambulatory Visit: Payer: No Typology Code available for payment source | Admitting: Neurology

## 2021-04-29 VITALS — BP 134/75 | HR 85 | Ht 66.0 in | Wt 294.5 lb

## 2021-04-29 DIAGNOSIS — Q282 Arteriovenous malformation of cerebral vessels: Secondary | ICD-10-CM

## 2021-04-29 DIAGNOSIS — R4189 Other symptoms and signs involving cognitive functions and awareness: Secondary | ICD-10-CM

## 2021-04-29 DIAGNOSIS — G43009 Migraine without aura, not intractable, without status migrainosus: Secondary | ICD-10-CM | POA: Diagnosis not present

## 2021-04-29 MED ORDER — AMITRIPTYLINE HCL 25 MG PO TABS: 25.0000 mg | ORAL_TABLET | Freq: Every day | ORAL | 6 refills | Status: DC

## 2021-04-29 MED ORDER — SUMATRIPTAN SUCCINATE 50 MG PO TABS: 50.0000 mg | ORAL_TABLET | ORAL | 6 refills | Status: DC | PRN

## 2021-04-29 NOTE — Progress Notes (Signed)
[facility]  PATIENT: Olivia Payne DOB: 24-Oct-1970  REQUESTING CLINICIAN: Richardean Chimera, MD HISTORY FROM: Patient and mother  REASON FOR VISIT: Brain fog/difficulty with concentration/ word finding difficulty   HISTORICAL  CHIEF COMPLAINT:  Chief Complaint  Patient presents with   Follow-up    Rm 12. Accompanied by mother. Pt presents to be evaluated for medication changes. Discuss stopping Topamax, psychiatrist believes it is causing issues with memory.   INTERVAL HISTORY 04/29/2021:  Patient presents today for follow-up with her mother.  She did have the neuropsych testing done but it indicated that the result should be interpreted with caution as they may represent an inaccurate assessment of the patient actual cognitive abilities.  Full neuropsych testing in my chart to review.  She reported that when we increase the Topamax she did well on headache status, but she did follow-up with psychiatry who recommend to discontinue Topamax as it may contribute to her memory problem.  She was started on Depakote 250 mg 3 times daily.  Denies any side effect from the medication.  She is still having headaches and she is looking to start a new preventive medication.  Patient also reported her psychiatrist is in the process to taper her lorazepam and discontinue it.  This is causing her a lot of stress because she is having more panic attacks now which cause her headaches.  When she having panic attack, she is still taking additional Ativan.  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  Patient reported doing well prior to her AVM rupture.  In 2014 she was  noted to have a right parietal AVM which was later resected.  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  She reported from April to November of the year she has been sick with multiple infections.  During this time, she has developed difficulty concentrating, she reports she cannot focus, she cannot think straight, she had difficulty reading email at work and not understanding what is going on.  She said that her work performance has markedly decreased to the point that her boss called her to tell her that she is not the same person anymore and then she need to go on disability. She was terminated from her job.  On top of her difficulty of work she also complains of daily headaches.  She had a history of migraine, was initially put on topiramate and Lyrica, since then her migraine headaches has improved but now she is getting morning headaches.  She reported headaches usually happen to her when people start to talk to heror she will start doing some work upon waking up.  But if left alone she will not develop any headache in the morning. She also reports a lot of stress, she is not bankruptcy, she reported "everybody drives and driving her crazy", she is more forgetful than before and even she forgets to take her meds.  She has follow-up with her primary care doctor and is pending a neuropsych evaluation.  She does not have a regular psychiatrist or therapist. Patient also reported history  of stroke and a history of aneurysm repair with a clip.  Per chart review via epic, I do not see any history of aneurysm repair with a clip.  She had a IR angiogram in 2017 without mention of any intracranial aneurysm.   She was told that she cannot do a brain MRI due to MRI tech not knowing what type of aneurysm clip she had.  Patient does not know which type she have and she reported  that she does not have the card, very difficult to verify if the clip is MRI compatible.  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     Headache History and Characteristics: Onset: 2014 after cranioplasty  Location: right side but still all over  Quality:  pulsating throbbing pain  Intensity: /10.  Duration: all day  Migrainous Features: None.  Aura: No  History of brain injury or tumor: Yes, bleeding AVM   Family history: Motion sickness: no Cardiac history: no  OTC: Aleve and diet coke  Caffeine: No  Sleep: Horrible, cant sleep, up all night sometimes working all night  Mood/ Stress: high very high   Prior prophylaxis: Propranolol: No  Verapamil:No TCA: No Topamax: Yes  Depakote: No Effexor: No Cymbalta: No Neurontin:No  Prior abortives: Triptan: No Anti-emetic: No Steroids: No Ergotamine suppository: No    OTHER MEDICAL CONDITIONS: Right parietal cerebral aneurysm s/p resection and cranioplasty, DMII,  HTN, Migraines, Hypothyroidism, ?Sleep apnea   REVIEW OF SYSTEMS: Full 14 system review of systems performed and negative with exception of: as noted in the HPI  ALLERGIES: Allergies  Allergen Reactions   Cleocin [Clindamycin Hcl] Anaphylaxis   Latex Swelling    "SEVERE REACTION"  When received flu shot.   Ketoconazole     HOME MEDICATIONS: Outpatient Medications Prior to Visit  Medication Sig Dispense Refill   albuterol (PROVENTIL HFA;VENTOLIN HFA) 108 (90 BASE) MCG/ACT inhaler Inhale 2 puffs into the lungs every 6 (six) hours as needed for wheezing.     B-D ULTRAFINE III SHORT PEN 31G X 8 MM MISC USE WITH TRESIBA AT BEDTIME 100 each 3   divalproex (DEPAKOTE) 250 MG DR tablet Take 1 tablet (250 mg total) by mouth 3 (three) times daily. 90 tablet 1   escitalopram (LEXAPRO) 20 MG tablet Take 20 mg by mouth daily.     glipiZIDE (GLUCOTROL XL) 5 MG 24 hr tablet TAKE 1 TABLET BY  MOUTH EVERY DAY WITH BREAKFAST 30 tablet 0   levothyroxine (SYNTHROID, LEVOTHROID) 75 MCG tablet Take 75 mcg by mouth daily before breakfast.     lisinopril (PRINIVIL,ZESTRIL) 5 MG tablet Take 5 mg by mouth daily.     LORazepam (ATIVAN) 1 MG tablet Take 2 tablets (2 mg total) by mouth 2 (two) times daily. (Patient taking differently: Take 2 mg by mouth 2 (two) times daily. Takes differently (1 @ night)) 60 tablet 0   metFORMIN (GLUCOPHAGE-XR) 500 MG 24 hr tablet TAKE 1 TABLET (500 MG TOTAL) BY MOUTH 2 (TWO) TIMES DAILY AFTER A MEAL. 60 tablet 0   norethindrone (AYGESTIN) 5 MG tablet Take 2.5 mg by mouth daily.     pregabalin (LYRICA) 150 MG capsule Take 150 mg by mouth at bedtime. For nerve pain      topiramate (TOPAMAX) 100 MG tablet Take 1 tablet (100 mg total) by mouth daily. For migraines 90 tablet 4   [START ON 05/03/2021] QUEtiapine (SEROQUEL) 25 MG tablet  Take 1 tablet (25 mg total) by mouth at bedtime. 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    Cerebral aneurysm    Cerebral AVM    Depression    Diabetes mellitus with other specified manifestations    peripheral neuropathy   Facial pain    since intubation   GERD (gastroesophageal reflux disease)    hx   History of chicken pox    Hypertension    Hypothyroidism    Migraine    Neuropathy    OA (osteoarthritis) of knee    left knee with chronic pain   Sleep apnea    does not use Cpap   Thyroid disease     PAST SURGICAL HISTORY: Past Surgical History:  Procedure Laterality Date   cerebral arteriogram     CESAREAN SECTION  1995    CRANIOPLASTY Right 07/09/2012   Procedure: CRANIOPLASTY;  Surgeon: Carmela Hurt, MD;  Location: [facility];  Service: Neurosurgery;  Laterality: Right;  Cranioplasty with bone retrieval from abdominal pocket   CRANIOTOMY Right 05/26/2012   Procedure: CRANIECTOMY INTRACRANIAL ARTERIO-VENOUS MALFORMATION DURAL COMPLEX (AVM). PLACEMENT OF BONE FLAB IN ABDOMINAL WALL;  Surgeon: Carmela Hurt, MD;  Location: [facility];  Service: Neurosurgery;  Laterality: Right;  RIGHT Craniectomy for arteriovenous malformation resection   DILATION AND CURETTAGE OF UTERUS  06/07/2002   EYE SURGERY  01/12/2013   laser    IR GENERIC HISTORICAL  02/12/2016   IR ANGIO INTRA EXTRACRAN SEL COM CAROTID INNOMINATE BILAT MOD SED 02/12/2016 Julieanne Cotton, MD [facility]   IR GENERIC HISTORICAL  02/12/2016   IR ANGIO VERTEBRAL SEL SUBCLAVIAN INNOMINATE UNI L MOD SED 02/12/2016 Julieanne Cotton, MD [facility]   IR GENERIC HISTORICAL  02/12/2016   IR ANGIO VERTEBRAL SEL VERTEBRAL UNI R MOD SED 02/12/2016 Julieanne Cotton, MD [facility]   RADIOLOGY WITH ANESTHESIA N/A 04/20/2012   Procedure: RADIOLOGY WITH ANESTHESIA;  Surgeon: Oneal Grout, MD;  Location: [facility];  Service: Radiology;  Laterality: N/A;   RADIOLOGY WITH ANESTHESIA N/A 05/26/2012   Procedure: RADIOLOGY WITH ANESTHESIA;  Surgeon: Oneal Grout, MD;  Location: [facility];  Service: Radiology;  Laterality: N/A;    FAMILY HISTORY: Family History  Problem Relation Age of Onset   Cancer Mother        Breast Cancer   Diabetes Father    Heart disease Father    Heart attack Father 41   Hypertension Father    Heart failure Father    High Cholesterol Sister    Cancer Paternal Aunt        Ovarian Cancer   Alzheimer's disease Maternal Grandmother    Cancer Maternal Grandmother    Diabetes Paternal Grandmother    Cancer Other        Lung Cancer-Maternal side of family    SOCIAL HISTORY: Social History   Socioeconomic History    Marital status: Married    Spouse name: michael   Number of children: 2   Years of education: Not on  file   Highest education level: Associate degree: occupational, Scientist, product/process developmenttechnical, or vocational program  Occupational History   Occupation: Buyer, retailANALYST    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   Occupation: on disability from work  Tobacco Use   Smoking status: Former    Packs/day: 1.50    Years: 26.00    Pack years: 39.00    Types: Cigarettes    Quit date: 07/08/2003    Years since quitting: 17.8   Smokeless tobacco: Never   Tobacco comments:    quit smoking in 2005  Vaping Use   Vaping Use: Never used  Substance and Sexual Activity   Alcohol use: No   Drug use: No   Sexual activity: Not Currently  Other Topics Concern   Not on file  Social History Narrative   Caffeine Use-no         Social Determinants of Health   Financial Resource Strain: Not on file  Food Insecurity: Not on file  Transportation Needs: Not on file  Physical Activity: Not on file  Stress: Not on file  Social Connections: Not on file  Intimate Partner Violence: Not on file    PHYSICAL EXAM   GENERAL EXAM/CONSTITUTIONAL: Vitals:  Vitals:   04/29/21 1244  BP: 134/75  Pulse: 85  Weight: 294 lb 8 oz (133.6 kg)  Height: 5\' 6"  (1.676 m)    Body mass index is 47.53 kg/m. Wt Readings from Last 3 Encounters:  04/29/21 294 lb 8 oz (133.6 kg)  04/25/21 296 lb 6.4 oz (134.4 kg)  02/26/21 300 lb (136.1 kg)   Patient is in no distress; well developed, nourished and groomed; neck is supple  CARDIOVASCULAR: Examination of carotid arteries is normal; no carotid bruits Regular rate and rhythm, no murmurs Examination of peripheral vascular system by observation and palpation is normal  EYES: Pupils round and reactive to light, Visual fields full to confrontation, Extraocular movements intacts,   MUSCULOSKELETAL: Gait, strength, tone, movements noted in Neurologic exam below  NEUROLOGIC: MENTAL STATUS:  No  flowsheet data found. awake, alert, oriented to person, place and time recent and remote memory intact Decrease attention and concentration  language fluent, comprehension intact, naming intact fund of knowledge appropriate  CRANIAL NERVE:  2nd, 3rd, 4th, 6th - pupils equal and reactive to light, visual fields full to confrontation, extraocular muscles intact, no nystagmus 5th - facial sensation symmetric 7th - facial strength symmetric 8th - hearing intact 9th - palate elevates symmetrically, uvula midline 11th - shoulder shrug symmetric 12th - tongue protrusion midline  MOTOR:  normal bulk and tone, full strength in the BUE, BLE  SENSORY:  normal and symmetric to light touch, pinprick, temperature, vibration  COORDINATION:  finger-nose-finger, fine finger movements normal  REFLEXES:  deep tendon reflexes present and symmetric  GAIT/STATION:  normal    DIAGNOSTIC DATA (LABS, IMAGING, TESTING) - I reviewed patient records, labs, notes, testing and imaging myself where available.  Lab Results  Component Value Date   WBC 12.4 (H) 02/12/2016   HGB 13.1 02/12/2016   HCT 40.1 02/12/2016   MCV 76.2 (L) 02/12/2016   PLT 315 02/12/2016      Component Value Date/Time   NA 137 12/17/2018 1346   K 4.7 12/17/2018 1346   CL 103 12/17/2018 1346   CO2 25 12/17/2018 1346   GLUCOSE 129 12/17/2018 1346   BUN 17 12/17/2018 1346   CREATININE 0.99 12/17/2018 1346   CALCIUM 9.6 12/17/2018 1346   PROT 7.5 04/25/2021 1102   ALBUMIN 3.5 04/25/2021  1102   AST 16 04/25/2021 1102   ALT 18 04/25/2021 1102   ALKPHOS 97 04/25/2021 1102   BILITOT 0.7 04/25/2021 1102   GFRNONAA 67 12/17/2018 1346   GFRAA 78 12/17/2018 1346   Lab Results  Component Value Date   CHOL  11/24/2009    143        ATP III CLASSIFICATION:  <200     mg/dL   Desirable  [id number]  mg/dL   Borderline High  >=045>=240    mg/dL   High          HDL 35 (L) 11/24/2009   LDLCALC  11/24/2009    86        Total  Cholesterol/HDL:CHD Risk Coronary Heart Disease Risk Table                     Men   Women  1/2 Average Risk   3.4   3.3  Average Risk       5.0   4.4  2 X Average Risk   9.6   7.1  3 X Average Risk  23.4   11.0        Use the calculated Patient Ratio above and the CHD Risk Table to determine the patient's CHD Risk.        ATP III CLASSIFICATION (LDL):  <100     mg/dL   Optimal  [id number]  mg/dL   Near or Above                    Optimal  130-159  mg/dL   Borderline  [id number]  mg/dL   High  >956>190     mg/dL   Very High   TRIG 213110 11/24/2009   CHOLHDL 4.1 11/24/2009   Lab Results  Component Value Date   HGBA1C 6.7 (A) 04/01/2019   Lab Results  Component Value Date   VITAMINB12 468 01/12/2018   Lab Results  Component Value Date   TSH 2.48 12/17/2018    Head CT 07/2012:  1.  Status post right frontal parietal cranioplasty.  2.  Interval resection of the majority of a right frontal and parietal AVM.  There is some residual intravascular coil material along the inferior right parietal and temporal lobe.  3.  Cortical and subcortical hemorrhage adjacent to the resection cavity extending into the white matter.  While this may be postoperative in nature, venous infarction is also considered.  4.  Midline shift of 4-5 mm.    ASSESSMENT AND PLAN  51 y.o. year old female with past medical history including AVM s/p repair, diabetes mellitus type 2, headaches, hypothyroidism who is presenting for follow up for memory problem, difficulty concentrating, difficulty finishing at work and new daily headaches.  Her neuropsych testing was completed but result may represent an inaccurate assessment of the patient cognitive abilities.  She is in the process of tapering her Ativan and discontinued it which caused her a lot of anxiety and panic attack which in turn make her headaches worse.  She did report with the Topamax her headache frequency improved but Topamax is known to decrease her cognitive  ability so I agree with stopping the medication.  I will switch the patient to amitriptyline, low-dose which I hope will help with her anxiety and sleep.  I will also give her sumatriptan to use as needed for severe headaches.  We will check basic dementia labs.  Follow-up in 7072-month.    1. Migraine  without aura and without status migrainosus, not intractable   2. AVM (arteriovenous malformation) brain   3. Cognitive impairment       Patient Instructions  Decrease Topamax to 1/2 tablet for 2 weeks then discontinue it  Start with Amitriptyline 25 mg nightly for headaches prevention  Start Sumatriptan 50 mg as soon as the headaches start, you can take a second tablet 2 hrs later if headaches not controlled  Return in 6 months      Orders Placed This Encounter  Procedures   TSH   Vitamin B12   Methylmalonic acid, serum    Meds ordered this encounter  Medications   amitriptyline (ELAVIL) 25 MG tablet    Sig: Take 1 tablet (25 mg total) by mouth at bedtime.    Dispense:  30 tablet    Refill:  6   SUMAtriptan (IMITREX) 50 MG tablet    Sig: Take 1 tablet (50 mg total) by mouth every 2 (two) hours as needed for migraine. May repeat in 2 hours if headache persists or recurs.    Dispense:  10 tablet    Refill:  6    Return in about 6 months (around 10/27/2021).    Windell Norfolk, MD 04/29/2021, 5:22 PM  [facility] [address], [address] [place], [place] [address] [phone]

## 2021-04-29 NOTE — Patient Instructions (Addendum)
Decrease Topamax to 1/2 tablet for 2 weeks then discontinue it  Start with Amitriptyline 25 mg nightly for headaches prevention  Start Sumatriptan 50 mg as soon as the headaches start, you can take a second tablet 2 hrs later if headaches not controlled  Return in 6 months

## 2021-04-30 ENCOUNTER — Telehealth: Payer: Self-pay

## 2021-04-30 DIAGNOSIS — F333 Major depressive disorder, recurrent, severe with psychotic symptoms: Secondary | ICD-10-CM

## 2021-04-30 DIAGNOSIS — Z79899 Other long term (current) drug therapy: Secondary | ICD-10-CM

## 2021-04-30 NOTE — Telephone Encounter (Signed)
I have printed out her Depakote level, platelet count, sodium level-we will have [place] fax new lab slip to her lab of choice.

## 2021-04-30 NOTE — Telephone Encounter (Signed)
pt lost her labwork orders and she going to [facility] [place] tomorrow and will need the labwork order faxed to [phone]

## 2021-05-01 ENCOUNTER — Other Ambulatory Visit: Payer: Self-pay | Admitting: Neurology

## 2021-05-04 LAB — METHYLMALONIC ACID, SERUM: Methylmalonic Acid, Quant: 97 nmol/L (ref 87–318)

## 2021-05-04 LAB — TSH: TSH: 1.38 mIU/L

## 2021-05-04 LAB — VITAMIN B12: Vitamin B-12: 457 pg/mL (ref 200–1100)

## 2021-05-06 ENCOUNTER — Telehealth: Payer: Self-pay

## 2021-05-06 DIAGNOSIS — F333 Major depressive disorder, recurrent, severe with psychotic symptoms: Secondary | ICD-10-CM | POA: Insufficient documentation

## 2021-05-06 MED ORDER — HYDROXYZINE PAMOATE 25 MG PO CAPS: 25.0000 mg | ORAL_CAPSULE | Freq: Two times a day (BID) | ORAL | 1 refills | Status: DC | PRN

## 2021-05-06 NOTE — Telephone Encounter (Signed)
pt called states that with the medication changes that she is having panic attack.  ?

## 2021-05-06 NOTE — Telephone Encounter (Signed)
Returned call to patient.  She reports she tried to wean herself off of the lorazepam by cutting lorazepam 1.5 mg into just a 1 mg and she started having panic attacks.  She hence had to increase the dosage back and had to take more than what she was prescribed.  She had a neurology appointment after that and she was started on amitriptyline at night for her headaches.  Patient reports her neurologist made some readjustment with her lorazepam.  She is interested in a medication that can replace the lorazepam. ? ?Discussed hydroxyzine.  She has not tried it in the past. ? ?Start hydroxyzine 25 mg twice a day as needed for severe anxiety. ? ?Discussed drug to drug interaction between Lexapro, amitriptyline and Seroquel. ? ?She has upcoming appointment with writer and will make further medication changes at that visit.  Patient agreeable. ?

## 2021-05-10 ENCOUNTER — Telehealth: Payer: Self-pay | Admitting: Psychiatry

## 2021-05-10 NOTE — Telephone Encounter (Signed)
Reviewed Depakote level-dated 05/01/2021-41.3-subtherapeutic.  Will discuss with patient comes back for a follow-up appointment next week. ?

## 2021-05-14 ENCOUNTER — Telehealth: Payer: Self-pay | Admitting: Psychiatry

## 2021-05-14 NOTE — Telephone Encounter (Signed)
Received another FMLA form. ? ?

## 2021-05-16 ENCOUNTER — Encounter: Payer: Self-pay | Admitting: Psychiatry

## 2021-05-16 ENCOUNTER — Other Ambulatory Visit: Payer: Self-pay

## 2021-05-16 ENCOUNTER — Ambulatory Visit (INDEPENDENT_AMBULATORY_CARE_PROVIDER_SITE_OTHER): Payer: No Typology Code available for payment source | Admitting: Psychiatry

## 2021-05-16 VITALS — BP 140/82 | HR 114 | Temp 98.3°F | Wt 298.2 lb

## 2021-05-16 DIAGNOSIS — F333 Major depressive disorder, recurrent, severe with psychotic symptoms: Secondary | ICD-10-CM | POA: Diagnosis not present

## 2021-05-16 DIAGNOSIS — F411 Generalized anxiety disorder: Secondary | ICD-10-CM | POA: Diagnosis not present

## 2021-05-16 DIAGNOSIS — Z9189 Other specified personal risk factors, not elsewhere classified: Secondary | ICD-10-CM | POA: Diagnosis not present

## 2021-05-16 DIAGNOSIS — F410 Panic disorder [episodic paroxysmal anxiety] without agoraphobia: Secondary | ICD-10-CM | POA: Diagnosis not present

## 2021-05-16 MED ORDER — DIVALPROEX SODIUM 500 MG PO DR TAB: 500.0000 mg | DELAYED_RELEASE_TABLET | Freq: Two times a day (BID) | ORAL | 1 refills | Status: DC

## 2021-05-16 MED ORDER — QUETIAPINE FUMARATE ER 50 MG PO TB24: 50.0000 mg | ORAL_TABLET | Freq: Every day | ORAL | 1 refills | Status: DC

## 2021-05-16 MED ORDER — ESCITALOPRAM OXALATE 5 MG PO TABS: 5.0000 mg | ORAL_TABLET | Freq: Every day | ORAL | 0 refills | Status: DC

## 2021-05-16 NOTE — Progress Notes (Signed)
[facility] MD OP Progress Note ? ?05/16/2021 5:24 PM ?Curlene Dolphin Rampersaud  ?MRN:  [id number] ? ?Chief Complaint:  ?Chief Complaint  ?Patient presents with  ? Follow-up: 51 year old Caucasian female with history of depression, anxiety, panic attacks, presented for medication management. ?  ? 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 ? ?Patient today returns reporting she has noticed some improvement with her mood swings since being on the Depakote.  She however continues to have good days and bad days.  There are days when she feels she cannot do a lot of things and other days when she feels down depressed and unable to get out of bed. ? ?Patient reports since being on the amitriptyline which was recently started by her neurologist, Dr.Amadou Camara ( 04/29/2021) for headaches she is sleeping better at night.  She however continues to sleep only 3 to 4 hours at night.  When she wakes up she spends her time on her computer and then tries to go back to bed in the morning around 7 AM and sleeps out of the 2 hours at that point.  She reports the first night that she took Seroquel she slept 8 hours straight and was very pleased.  However that did not last too long.  Denies side effects to the Seroquel. ? ?Patient denies any suicidality, homicidality or perceptual disturbances. ? ?Reports she continues to struggle with memory problems, inability to complete tasks at home.  She reports she however has been coping better with the fact that she has not been able to do certain things.  Previously she used to get upset and agitated when she used to feel that way.  However not anymore.  She has been coping better and her family has also noticed that difference in her.   Patient reports she recently she heard unpleasant news about her son, in the past it used to upset her so much however this time since being on her current medication regimen she has been able to cope better and she was able to stay calm. ? ?Patient denies any other concerns today.  Review ? ? ? ?Visit Diagnosis:  ?  ICD-10-CM   ?1. Severe episode of recurrent major depressive disorder, with psychotic features (HCC)  F33.3 divalproex (DEPAKOTE) 500 MG DR tablet  ?  escitalopram (LEXAPRO) 5 MG tablet  ?  QUEtiapine (SEROQUEL XR) 50 MG TB24 24 hr tablet  ?  Valproic acid level  ? mixed features  ?  ?2. GAD (generalized anxiety disorder)  F41.1 divalproex (DEPAKOTE) 500 MG DR tablet  ?  escitalopram (LEXAPRO) 5 MG tablet  ?  QUEtiapine (SEROQUEL XR) 50 MG TB24 24 hr tablet  ?  Valproic acid level  ?  ?3. Panic disorder  F41.0 QUEtiapine (SEROQUEL XR) 50 MG TB24 24 hr tablet  ?  ?4. At risk for prolonged QT interval syndrome  Z91.89   ?  ?5. High risk medication use  Z79.899 Valproic acid level  ?  ? ? ?Past Psychiatric History: Past psychiatric history from progress note on 04/25/2021.  Past trials of Lexapro, trazodone, Rozerem, Lexapro.  Patient had neuropsychological testing done-Dr. Madelaine Bhat McDermott-dated 03/07/2021-patient with diagnosis of cognitive changes, MDD, GAD and panic disorder. ? ?Past Medical History:  ?Past Medical History:  ?Diagnosis Date  ? Allergy   ?  Anxiety   ? Asthma   ? 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   ? Cerebral aneurysm   ? Cerebral AVM   ? Depression   ? Diabetes mellitus with other specified manifestations   ? peripheral neuropathy  ? Facial pain   ? since intubation  ? GERD (gastroesophageal reflux disease)   ? hx  ? History of chicken pox   ? Hypertension   ? Hypothyroidism   ? Migraine   ? Neuropathy   ? OA (osteoarthritis) of knee   ? left knee with chronic pain  ? Sleep apnea   ? does not use Cpap  ? Thyroid disease   ?  ?Past Surgical History:   ?Procedure Laterality Date  ? cerebral arteriogram    ? CESAREAN SECTION  1995  ? CRANIOPLASTY Right 07/09/2012  ? Procedure: CRANIOPLASTY;  Surgeon: Carmela Hurt, MD;  Location: [facility];  Service: Neurosurgery;  Laterality: Right;  Cranioplasty with bone retrieval from abdominal pocket  ? CRANIOTOMY Right 05/26/2012  ? Procedure: CRANIECTOMY INTRACRANIAL ARTERIO-VENOUS MALFORMATION DURAL COMPLEX (AVM). PLACEMENT OF BONE FLAB IN ABDOMINAL WALL;  Surgeon: Carmela Hurt, MD;  Location: [facility];  Service: Neurosurgery;  Laterality: Right;  RIGHT Craniectomy for arteriovenous malformation resection  ? DILATION AND CURETTAGE OF UTERUS  06/07/2002  ? EYE SURGERY  01/12/2013  ? laser   ? IR GENERIC HISTORICAL  02/12/2016  ? IR ANGIO INTRA EXTRACRAN SEL COM CAROTID INNOMINATE BILAT MOD SED 02/12/2016 Julieanne Cotton, MD [facility]  ? IR GENERIC HISTORICAL  02/12/2016  ? IR ANGIO VERTEBRAL SEL SUBCLAVIAN INNOMINATE UNI L MOD SED 02/12/2016 Julieanne Cotton, MD [facility]  ? IR GENERIC HISTORICAL  02/12/2016  ? IR ANGIO VERTEBRAL SEL VERTEBRAL UNI R MOD SED 02/12/2016 Julieanne Cotton, MD [facility]  ? RADIOLOGY WITH ANESTHESIA N/A 04/20/2012  ? Procedure: RADIOLOGY WITH ANESTHESIA;  Surgeon: Oneal Grout, MD;  Location: [facility];  Service: Radiology;  Laterality: N/A;  ? RADIOLOGY WITH ANESTHESIA N/A 05/26/2012  ? Procedure: RADIOLOGY WITH ANESTHESIA;  Surgeon: Oneal Grout, MD;  Location: [facility];  Service: Radiology;  Laterality: N/A;  ? ? ?Family Psychiatric History: Reviewed family psychiatric history from progress note on 04/25/2021. ? ?Family History:  ?Family History  ?Problem Relation Age of Onset  ? Cancer Mother   ?     Breast Cancer  ? Depression Father   ? Diabetes Father   ? Heart disease Father   ? Heart attack Father 98  ? Hypertension Father   ? Heart failure Father   ? High Cholesterol Sister   ? Cancer Paternal Aunt   ?     Ovarian Cancer  ? Suicidality Paternal Uncle   ?  Alzheimer's disease Maternal Grandmother   ? Cancer Maternal Grandmother   ? Diabetes Paternal Grandmother   ? Bipolar disorder Cousin   ? Cancer Other   ?     Lung Cancer-Maternal side of family  ? ? ?Social History: Reviewed social history from progress note on 04/25/2021. ?Social History  ? ?Socioeconomic History  ? Marital status: Married  ?  Spouse name: michael  ? Number of children: 2  ? Years of education: Not on file  ? Highest education level: Associate degree: occupational, Scientist, product/process development, or vocational program  ?Occupational History  ? Occupation: ANALYST  ?  Employer: [employer]  ? Occupation: on disability from work  ?Tobacco Use  ?  Smoking status: Former  ?  Packs/day: 1.50  ?  Years: 26.00  ?  Pack years: 39.00  ?  Types: Cigarettes  ?  Quit date: 07/08/2003  ?  Years since quitting: 17.8  ? Smokeless tobacco: Never  ? Tobacco comments:  ?  quit smoking in 2005  ?Vaping Use  ? Vaping Use: Never used  ?Substance and Sexual Activity  ? Alcohol use: No  ? Drug use: No  ? Sexual activity: Not Currently  ?Other Topics Concern  ? Not on file  ?Social History Narrative  ? Caffeine Use-no  ?   ?   ? ?Social Determinants of Health  ? ?Financial Resource Strain: Not on file  ?Food Insecurity: Not on file  ?Transportation Needs: Not on file  ?Physical Activity: Not on file  ?Stress: Not on file  ?Social Connections: Not on file  ? ? ?Allergies:  ?Allergies  ?Allergen Reactions  ? Cleocin [Clindamycin Hcl] Anaphylaxis  ? Latex Swelling  ?  "SEVERE REACTION"  When received flu shot.  ? Ketoconazole   ? ? ?Metabolic Disorder Labs: ?Lab Results  ?Component Value Date  ? HGBA1C 6.7 (A) 04/01/2019  ? MPG 240 06/18/2018  ? MPG 217 01/12/2018  ? ?No results found for: PROLACTIN ?Lab Results  ?Component Value Date  ? CHOL  11/24/2009  ?  143        ?ATP III CLASSIFICATION: ? <200     mg/dL   Desirable ? [id number]  mg/dL   Borderline High ? >=102>=240    mg/dL   High ?        ? TRIG 110 11/24/2009  ? HDL 35 (L) 11/24/2009  ?  CHOLHDL 4.1 11/24/2009  ? VLDL 22 11/24/2009  ? LDLCALC  11/24/2009  ?  86        ?Total Cholesterol/HDL:CHD Risk ?Coronary Heart Disease Risk Table ?                    Men   Women ? 1/2 Average Risk   3.4   3.

## 2021-05-16 NOTE — Patient Instructions (Signed)
Start taking Depakote 500 mg twice a day - DOSE INCREASE  ? ?Start weaning off Lexapro - start lexapro 5 mg daily for 14 days and stop . ? ?Start taking Seroquel XR 50 mg at bedtime - DOSE INCREASE. ? ?Please get labs done - Depakote level 5 days after taking higher dose - Go to lab first thing in the AM before taking your AM dose of Depakote. ? ? ?Please be aware there is a drug to drug interaction between Amitriptyline as well as lexapro , seroquel and sumatriptan ( headache medication) . That is why we are tapering you off of Lexapro. ? ?If you have any side effects , severe - please go to nearest emergency department. ? ?Serotonin Syndrome ?Serotonin is a chemical in your body (neurotransmitter) that helps to control several functions, such as: ?Brain and nerve cell function. ?Mood and emotions. ?Memory. ?Eating. ?Sleeping. ?Sexual activity. ?Stress response. ?Having too much serotonin in your body can cause serotonin syndrome. This condition can be harmful to your brain and nerve cells. This can be a life-threatening condition. ?What are the causes? ?This condition may be caused by taking medicines or drugs that increase the level of serotonin in your body, such as: ?Antidepressant medicines. ?Migraine medicines. ?Certain pain medicines. ?Certain drugs, including ecstasy, LSD, cocaine, and amphetamines. ?Over-the-counter cough or cold medicines that contain dextromethorphan. ?Certain herbal supplements, including St. John's wort, ginseng, and nutmeg. ?This condition usually occurs when you take these medicines or drugs in combination, but it can also happen with a high dose of a single medicine or drug. ?What increases the risk? ?You are more likely to develop this condition if: ?You just started taking a medicine or drug that increases the level of serotonin in the body. ?You recently increased the dose of a medicine or drug that increases the level of serotonin in the body. ?You take more than one medicine  or drug that increases the level of serotonin in the body. ?What are the signs or symptoms? ?Symptoms of this condition usually start within several hours of taking a medicine or drug. Symptoms may be mild or severe. Mild symptoms include: ?Sweating. ?Restlessness or agitation. ?Muscle twitching or stiffness. ?Rapid heart rate. ?Nausea and vomiting. ?Diarrhea. ?Headache. ?Shivering or goose bumps. ?Confusion. ?Severe symptoms include: ?Irregular heartbeat. ?Seizures. ?Loss of consciousness. ?High fever. ?How is this diagnosed? ?This condition may be diagnosed based on: ?Your medical history.  ?A physical exam. ?Your prior use of drugs and medicines. ?Blood or urine tests. These may be used to rule out other causes of your symptoms. ?How is this treated? ?The treatment for this condition depends on the severity of your symptoms. ?For mild cases, stopping the medicine or drug that caused your condition is usually all that is needed. ?For moderate to severe cases, treatment in a hospital may be needed to prevent or manage life-threatening symptoms. This may include medicines to control your symptoms, IV fluids, interventions to support your breathing, and treatments to control your body temperature. ?Follow these instructions at home: ?Medicines ? ?Take over-the-counter and prescription medicines only as told by your health care provider. This is important. ?Check with your health care provider before you start taking any new prescriptions, over-the-counter medicines, herbs, or supplements. ?Avoid combining any medicines that can cause this condition to occur. ?Lifestyle ? ?Maintain a healthy lifestyle. ?Eat a healthy diet that includes plenty of vegetables, fruits, whole grains, low-fat dairy products, and lean protein. Do not eat a lot of foods that are  high in fat, added sugars, or salt. ?Get the right amount and quality of sleep. Most adults need 7-9 hours of sleep each night. ?Make time to exercise, even if it is  only for short periods of time. Most adults should exercise for at least 150 minutes each week. ?Do not drink alcohol. ?Do not use illegal drugs, and do not take medicines for reasons other than they are prescribed. ?General instructions ?Do not use any products that contain nicotine or tobacco, such as cigarettes and e-cigarettes. If you need help quitting, ask your health care provider. ?Keep all follow-up visits as told by your health care provider. This is important. ?Contact a health care provider if: ?Your symptoms do not improve or they get worse. ?Get help right away if you: ?Have worsening confusion, severe headache, chest pain, high fever, seizures, or loss of consciousness. ?Experience serious side effects of medicine, such as swelling of your face, lips, tongue, or throat. ?Have serious thoughts about hurting yourself or others. ?These symptoms may represent a serious problem that is an emergency. Do not wait to see if the symptoms will go away. Get medical help right away. Call your local emergency services (911 in the [place] Do not drive yourself to the hospital. ?If you ever feel like you may hurt yourself or others, or have thoughts about taking your own life, get help right away. You can go to your nearest emergency department or call: ?Your local emergency services (911 in the [place] ??A suicide crisis helpline, such as the National Suicide Prevention Lifeline at 813-575-0173. This is open 24 hours a day. ?Summary ?Serotonin is a brain chemical that helps to regulate the nervous system. High levels of serotonin in the body can cause serotonin syndrome, which is a very dangerous condition. ?This condition may be caused by taking medicines or drugs that increase the level of serotonin in your body. ?Treatment depends on the severity of your symptoms. For mild cases, stopping the medicine or drug that caused your condition is usually all that is needed. ?Check with your health care provider before  you start taking any new prescriptions, over-the-counter medicines, herbs, or supplements. ?This information is not intended to replace advice given to you by your health care provider. Make sure you discuss any questions you have with your health care provider. ?Document Revised: 03/27/2017 Document Reviewed: 03/27/2017 ?Elsevier Patient Education ? 2022 [employer] ? ? ? ? ? ?

## 2021-05-21 ENCOUNTER — Other Ambulatory Visit: Payer: Self-pay | Admitting: Neurology

## 2021-05-21 NOTE — Telephone Encounter (Signed)
Rx refilled for 90 day supply. 

## 2021-05-23 ENCOUNTER — Telehealth: Payer: Self-pay | Admitting: Psychiatry

## 2021-05-23 NOTE — Telephone Encounter (Signed)
Returned call to patient.  Patient reports she made a mistake and went to the lab on the sixth the day instead of the fifth the day for her Depakote labs. ? ?Reassured patient.  Advised patient to stay on the Depakote. ? ?Patient reports her Depakote level came back as 42.5.  Discussed this was subtherapeutic however advised her to stay on this dosage until her next visit which is coming up.  Patient advised to give the medication more time. ? ? ?

## 2021-05-23 NOTE — Telephone Encounter (Signed)
Patient request call from [place] about medication and labs ?

## 2021-05-28 ENCOUNTER — Telehealth (HOSPITAL_COMMUNITY): Payer: Self-pay | Admitting: Psychiatry

## 2021-05-28 NOTE — Telephone Encounter (Signed)
D:  Pt phoned inquiring about MH-IOP.  A:  Oriented pt.  Pt states she is looking more for individual therapy and medication mgmt.  "I've been out of work since Dec. 2022 and I will be going back in two months, so I need to get fixed by then or I will lose my job."  Provided pt with [facility] phone #'s before transferring her to the front desk.  Encouraged pt to call the case mgr back if she changes her mind re: MH-IOP.  R:  Pt receptive. ?

## 2021-05-29 ENCOUNTER — Other Ambulatory Visit: Payer: Self-pay | Admitting: Psychiatry

## 2021-05-29 ENCOUNTER — Telehealth: Payer: Self-pay | Admitting: Psychiatry

## 2021-05-29 DIAGNOSIS — F333 Major depressive disorder, recurrent, severe with psychotic symptoms: Secondary | ICD-10-CM

## 2021-05-29 NOTE — Telephone Encounter (Signed)
Reviewed valproic acid level received from [facility]-dated 05/22/2021-45.2-subtherapeutic. ?Will not make any additional changes with medications yet.  Will monitor patient on this dosage and will consider changing medications in the future as needed.  She is currently on multiple mood stabilizers.  Will discuss at her next visit with Clinical research associate. ?

## 2021-05-30 ENCOUNTER — Telehealth: Payer: Self-pay

## 2021-05-30 NOTE — Telephone Encounter (Signed)
Called twice, left voice message to contact the office.  - Please ask her if she is interested in trying higher dose of quetiapine (100 mg) to see if it is helpful for depression.

## 2021-05-30 NOTE — Telephone Encounter (Signed)
pt called states that she thinks her medication needs to be changed. she states that she more depressed, she just doesn;'t feel right, she states that she either doesn't sleep or she over sleeps. she wants to speak to someone about medication changes.  ?

## 2021-06-07 ENCOUNTER — Other Ambulatory Visit: Payer: Self-pay | Admitting: Psychiatry

## 2021-06-07 DIAGNOSIS — F411 Generalized anxiety disorder: Secondary | ICD-10-CM

## 2021-06-07 DIAGNOSIS — F333 Major depressive disorder, recurrent, severe with psychotic symptoms: Secondary | ICD-10-CM

## 2021-06-07 DIAGNOSIS — F410 Panic disorder [episodic paroxysmal anxiety] without agoraphobia: Secondary | ICD-10-CM

## 2021-06-26 ENCOUNTER — Encounter: Payer: Self-pay | Admitting: Psychiatry

## 2021-06-26 ENCOUNTER — Telehealth (INDEPENDENT_AMBULATORY_CARE_PROVIDER_SITE_OTHER): Payer: No Typology Code available for payment source | Admitting: Psychiatry

## 2021-06-26 DIAGNOSIS — F410 Panic disorder [episodic paroxysmal anxiety] without agoraphobia: Secondary | ICD-10-CM

## 2021-06-26 DIAGNOSIS — F411 Generalized anxiety disorder: Secondary | ICD-10-CM

## 2021-06-26 DIAGNOSIS — F3341 Major depressive disorder, recurrent, in partial remission: Secondary | ICD-10-CM | POA: Diagnosis not present

## 2021-06-26 DIAGNOSIS — Z79899 Other long term (current) drug therapy: Secondary | ICD-10-CM | POA: Diagnosis not present

## 2021-06-26 MED ORDER — QUETIAPINE FUMARATE ER 50 MG PO TB24: 100.0000 mg | ORAL_TABLET | Freq: Every day | ORAL | 0 refills | Status: AC

## 2021-06-26 MED ORDER — DIVALPROEX SODIUM 500 MG PO DR TAB: 500.0000 mg | DELAYED_RELEASE_TABLET | Freq: Two times a day (BID) | ORAL | 0 refills | Status: AC

## 2021-06-26 NOTE — Progress Notes (Signed)
Virtual Visit via Video Note ? ?I connected with Olivia Payne on 06/26/21 at  2:00 PM EDT by a video enabled telemedicine application and verified that I am speaking with the correct person using two identifiers. ? ?Location ?Provider Location : [facility] ?Patient Location : Home ? ?Participants: Patient , Provider ? ?  ?I discussed the limitations of evaluation and management by telemedicine and the availability of in person appointments. The patient expressed understanding and agreed to proceed. ? ?  ?I discussed the assessment and treatment plan with the patient. The patient was provided an opportunity to ask questions and all were answered. The patient agreed with the plan and demonstrated an understanding of the instructions. ?  ?The patient was advised to call back or seek an in-person evaluation if the symptoms worsen or if the condition fails to improve as anticipated. ? ? ?BH MD OP Progress Note ? ?06/26/2021 2:44 PM ?Curlene Dolphin [place]  ?MRN:  [id number] ? ?Chief Complaint:  ?Chief Complaint  ?Patient presents with  ? Follow-up: 51 year old Caucasian female with history of depression, anxiety, panic attacks, presented for medication management.  ? 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 ? ?Patient today reports overall she has noticed good improvement with regards to her mood.  She does not feel as depressed as she used to before.  Patient denies any anhedonia. ? ?Patient reports she continues to struggle with her concentration and memory.  She continues to have short-term memory loss.  Patient reports she however is trying to be more organized and structured to see if that will help. ? ?Patient reports sleep has improved although she  continues to struggle with sleep problems at least 3-4 times a week.  Interested in dosage increase of her Seroquel to address this.  Currently tolerating the Seroquel well.  She does have dry mouth , likely contributed by all of her medications. ? ?Patient reports she is currently in individual therapy as well as group therapy.  That is currently going well.  She is scheduled to have an appointment with a therapist at [facility] soon. ? ?Patient denies any suicidality, homicidality or perceptual disturbances. ? ?Appeared to be alert, oriented to person place time situation.  She also seemed to be more pleasant and spontaneous today . ? ?Reviewed and discussed Depakote level. ? ?Denies any other concerns today. ? ? ?Visit Diagnosis:  ?  ICD-10-CM   ?1. MDD (major depressive disorder), recurrent, in partial remission (HCC)  F33.41 QUEtiapine (SEROQUEL XR) 50 MG TB24 24 hr tablet  ?  ?2. GAD (generalized anxiety disorder)  F41.1 QUEtiapine (SEROQUEL XR) 50 MG TB24 24 hr tablet  ?  divalproex (DEPAKOTE) 500 MG DR tablet  ?  ?3. Panic disorder  F41.0   ?  ?4. High risk medication use  Z79.899   ?  ? ? ?Past Psychiatric History: Reviewed past psychiatric history from progress note on 04/25/2021.  Past trials of Lexapro, trazodone, Rozerem.  Patient had neuropsychological testing done-Dr. Madelaine Bhat McDermott-dated 03/07/2021-patient with diagnosis of cognitive changes, MDD, GAD and panic disorder. ? ?Past Medical History:  ?Past Medical History:  ?Diagnosis Date  ? Allergy   ? Anxiety   ? Asthma   ? 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   ?  Cerebral aneurysm   ? Cerebral AVM   ? Depression   ? Diabetes mellitus with other specified manifestations   ? peripheral neuropathy  ? Facial pain   ? since intubation  ? GERD (gastroesophageal reflux disease)   ? hx  ? History of chicken pox   ? Hypertension   ? Hypothyroidism   ? Migraine   ? Neuropathy   ? OA (osteoarthritis) of knee   ? left knee with chronic  pain  ? Sleep apnea   ? does not use Cpap  ? Thyroid disease   ?  ?Past Surgical History:  ?Procedure Laterality Date  ? cerebral arteriogram    ? CESAREAN SECTION  1995  ? CRANIOPLASTY Right 07/09/2012  ? Procedure: CRANIOPLASTY;  Surgeon: Carmela HurtKyle L Cabbell, MD;  Location: [facility];  Service: Neurosurgery;  Laterality: Right;  Cranioplasty with bone retrieval from abdominal pocket  ? CRANIOTOMY Right 05/26/2012  ? Procedure: CRANIECTOMY INTRACRANIAL ARTERIO-VENOUS MALFORMATION DURAL COMPLEX (AVM). PLACEMENT OF BONE FLAB IN ABDOMINAL WALL;  Surgeon: Carmela HurtKyle L Cabbell, MD;  Location: [facility];  Service: Neurosurgery;  Laterality: Right;  RIGHT Craniectomy for arteriovenous malformation resection  ? DILATION AND CURETTAGE OF UTERUS  06/07/2002  ? EYE SURGERY  01/12/2013  ? laser   ? IR GENERIC HISTORICAL  02/12/2016  ? IR ANGIO INTRA EXTRACRAN SEL COM CAROTID INNOMINATE BILAT MOD SED 02/12/2016 Julieanne CottonSanjeev Deveshwar, MD [facility]  ? IR GENERIC HISTORICAL  02/12/2016  ? IR ANGIO VERTEBRAL SEL SUBCLAVIAN INNOMINATE UNI L MOD SED 02/12/2016 Julieanne CottonSanjeev Deveshwar, MD [facility]  ? IR GENERIC HISTORICAL  02/12/2016  ? IR ANGIO VERTEBRAL SEL VERTEBRAL UNI R MOD SED 02/12/2016 Julieanne CottonSanjeev Deveshwar, MD [facility]  ? RADIOLOGY WITH ANESTHESIA N/A 04/20/2012  ? Procedure: RADIOLOGY WITH ANESTHESIA;  Surgeon: Oneal GroutSanjeev K Deveshwar, MD;  Location: [facility];  Service: Radiology;  Laterality: N/A;  ? RADIOLOGY WITH ANESTHESIA N/A 05/26/2012  ? Procedure: RADIOLOGY WITH ANESTHESIA;  Surgeon: Oneal GroutSanjeev K Deveshwar, MD;  Location: [facility];  Service: Radiology;  Laterality: N/A;  ? ? ?Family Psychiatric History: Reviewed family psychiatric history from progress note on 04/25/2021. ? ?Family History:  ?Family History  ?Problem Relation Age of Onset  ? Cancer Mother   ?     Breast Cancer  ? Depression Father   ? Diabetes Father   ? Heart disease Father   ? Heart attack Father [id number]  ? Hypertension Father   ? Heart failure Father   ? High Cholesterol  Sister   ? Cancer Paternal Aunt   ?     Ovarian Cancer  ? Suicidality Paternal Uncle   ? Alzheimer's disease Maternal Grandmother   ? Cancer Maternal Grandmother   ? Diabetes Paternal Grandmother   ? Bipolar disorder Cousin   ? Cancer Other   ?     Lung Cancer-Maternal side of family  ? ? ?Social History: Reviewed social history from progress note on 04/25/2021. ?Social History  ? ?Socioeconomic History  ? Marital status: Married  ?  Spouse name: michael  ? Number of children: 2  ? Years of education: Not on file  ? Highest education level: Associate degree: occupational, Scientist, product/process developmenttechnical, or vocational program  ?Occupational History  ? Occupation: ANALYST  ?  Employer: [employer]  ? Occupation: on disability from work  ?Tobacco Use  ? Smoking status: Former  ?  Packs/day: 1.50  ?  Years: 26.00  ?  Pack years: 39.00  ?  Types: Cigarettes  ?  Quit date: 07/08/2003  ?  Years since quitting: 17.9  ? Smokeless tobacco: Never  ? Tobacco comments:  ?  quit smoking in 2005  ?Vaping Use  ? Vaping Use: Never used  ?Substance and Sexual Activity  ? Alcohol use: No  ? Drug use: No  ? Sexual activity: Not Currently  ?Other Topics Concern  ? Not on file  ?Social History Narrative  ? Caffeine Use-no  ?   ?   ? ?Social Determinants of Health  ? ?Financial Resource Strain: Not on file  ?Food Insecurity: Not on file  ?Transportation Needs: Not on file  ?Physical Activity: Not on file  ?Stress: Not on file  ?Social Connections: Not on file  ? ? ?Allergies:  ?Allergies  ?Allergen Reactions  ? Cleocin [Clindamycin Hcl] Anaphylaxis  ? Latex Swelling  ?  "SEVERE REACTION"  When received flu shot.  ? Ketoconazole   ? ? ?Metabolic Disorder Labs: ?Lab Results  ?Component Value Date  ? HGBA1C 6.7 (A) 04/01/2019  ? MPG 240 06/18/2018  ? MPG 217 01/12/2018  ? ?No results found for: PROLACTIN ?Lab Results  ?Component Value Date  ? CHOL  11/24/2009  ?  143        ?ATP III CLASSIFICATION: ? <200     mg/dL   Desirable ? 569-794  mg/dL   Borderline  High ? >=801    mg/dL   High ?        ? TRIG 110 11/24/2009  ? HDL 35 (L) 11/24/2009  ? CHOLHDL 4.1 11/24/2009  ? VLDL 22 11/24/2009  ? LDLCALC  11/24/2009  ?  86        ?Total Cholesterol/HDL:CHD Risk ?Co

## 2021-06-27 ENCOUNTER — Telehealth: Payer: No Typology Code available for payment source | Admitting: Psychiatry

## 2021-08-07 ENCOUNTER — Encounter: Payer: Self-pay | Admitting: Psychiatry

## 2021-08-07 ENCOUNTER — Telehealth (INDEPENDENT_AMBULATORY_CARE_PROVIDER_SITE_OTHER): Payer: No Typology Code available for payment source | Admitting: Psychiatry

## 2021-08-07 DIAGNOSIS — F410 Panic disorder [episodic paroxysmal anxiety] without agoraphobia: Secondary | ICD-10-CM | POA: Diagnosis not present

## 2021-08-07 DIAGNOSIS — F411 Generalized anxiety disorder: Secondary | ICD-10-CM

## 2021-08-07 DIAGNOSIS — F3342 Major depressive disorder, recurrent, in full remission: Secondary | ICD-10-CM | POA: Diagnosis not present

## 2021-08-07 MED ORDER — BENZTROPINE MESYLATE 0.5 MG PO TABS: 0.5000 mg | ORAL_TABLET | Freq: Every evening | ORAL | 1 refills | Status: DC | PRN

## 2021-08-07 NOTE — Progress Notes (Signed)
Virtual Visit via Video Note  I connected with Valmai Vongunten Weinhold on 08/07/21 at  1:00 PM EDT by a video enabled telemedicine application and verified that I am speaking with the correct person using two identifiers.  Location Provider Location : [facility] Patient Location : Home  Participants: Patient , Provider   I discussed the limitations of evaluation and management by telemedicine and the availability of in person appointments. The patient expressed understanding and agreed to proceed.   I discussed the assessment and treatment plan with the patient. The patient was provided an opportunity to ask questions and all were answered. The patient agreed with the plan and demonstrated an understanding of the instructions.   The patient was advised to call back or seek an in-person evaluation if the symptoms worsen or if the condition fails to improve as anticipated.   [place] MD OP Progress Note  08/07/2021 4:59 PM BESAN MCKEOUGH  MRN:  [id number]  Chief Complaint:  Chief Complaint  Patient presents with   Follow-up: 51 year old Caucasian female with history of depression, anxiety, panic attacks, presented for medication management.   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  Patient today reports she is currently working.  She reports she works 9 AM to 5 PM, works from home.  She started working on May 8, went back to her previous job.  She however reports she does not think she wants to continue this since she has been having a lot of trouble keeping up with her 'computer'.  Patient reports she hence is thinking about going back on her disability.    Patient reports she really likes being on the Depakote and the Seroquel  combination.  She reports her mood symptoms are better.  Denies any significant anxiety or panic attacks.  Reports she goes to bed early at around 6 PM or 7 PM and sleeps until 9 in the morning.  She sleeps long hours and that helps her.  She is able to function throughout the day when she wakes up.  She does report possible internal restlessness when she goes to bed at night likely from medications like Depakote or Seroquel.  Currently does not take the lorazepam much.  Has been trying to wean herself off of it, may need it once a month or so.  Patient denies any suicidality, homicidality or perceptual disturbances.  Patient denies any other concerns today.  Visit Diagnosis:    ICD-10-CM   1. MDD (major depressive disorder), recurrent, in full remission ([place])  F33.42 benztropine (COGENTIN) 0.5 MG tablet    2. GAD (generalized anxiety disorder)  F41.1     3. Panic disorder  F41.0       Past Psychiatric History: Reviewed past psychiatric history from progress note on 04/25/2021.  Past trials of Lexapro, trazodone, Rozerem.  Neuropsychological testing completed-Dr. Andree Elk McDermott -03/07/2021-diagnosis of cognitive changes, MDD, GAD and panic disorder.  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    Cerebral aneurysm    Cerebral AVM    Depression    Diabetes mellitus with other specified manifestations    peripheral  neuropathy   Facial pain    since intubation   GERD (gastroesophageal reflux disease)    hx   History of chicken pox    Hypertension    Hypothyroidism    Migraine    Neuropathy    OA (osteoarthritis) of knee    left knee with chronic pain   Sleep apnea    does not use Cpap   Thyroid disease     Past Surgical History:  Procedure Laterality Date   cerebral arteriogram     CESAREAN SECTION  1995   CRANIOPLASTY Right 07/09/2012   Procedure:  CRANIOPLASTY;  Surgeon: Winfield Cunas, MD;  [facility];  Service: Neurosurgery;  Laterality: Right;  Cranioplasty with bone retrieval from abdominal pocket   CRANIOTOMY Right 05/26/2012   Procedure: CRANIECTOMY INTRACRANIAL ARTERIO-VENOUS MALFORMATION DURAL COMPLEX (AVM). PLACEMENT OF BONE FLAB IN ABDOMINAL WALL;  Surgeon: Winfield Cunas, MD;  Location: [facility];  Service: Neurosurgery;  Laterality: Right;  RIGHT Craniectomy for arteriovenous malformation resection   DILATION AND CURETTAGE OF UTERUS  06/07/2002   EYE SURGERY  01/12/2013   laser    IR GENERIC HISTORICAL  02/12/2016   IR ANGIO INTRA EXTRACRAN SEL COM CAROTID INNOMINATE BILAT MOD SED 02/12/2016 Luanne Bras, MD [facility]   IR GENERIC HISTORICAL  02/12/2016   IR ANGIO VERTEBRAL SEL SUBCLAVIAN INNOMINATE UNI L MOD SED 02/12/2016 Luanne Bras, MD [facility]   IR GENERIC HISTORICAL  02/12/2016   IR ANGIO VERTEBRAL SEL VERTEBRAL UNI R MOD SED 02/12/2016 Luanne Bras, MD [facility]   RADIOLOGY WITH ANESTHESIA N/A 04/20/2012   Procedure: RADIOLOGY WITH ANESTHESIA;  Surgeon: Rob Hickman, MD;  Location[facility];  Service: Radiology;  Laterality: N/A;   RADIOLOGY WITH ANESTHESIA N/A 05/26/2012   Procedure: RADIOLOGY WITH ANESTHESIA;  Surgeon: Rob Hickman, MD;  Location: [facility];  Service: Radiology;  Laterality: N/A;    Family Psychiatric History: Reviewed family psychiatric history from progress note on 04/25/2021.  Family History:  Family History  Problem Relation Age of Onset   Cancer Mother        Breast Cancer   Depression Father    Diabetes Father    Heart disease Father    Heart attack Father 69   Hypertension Father    Heart failure Father    High Cholesterol Sister    Cancer Paternal Aunt        Ovarian Cancer   Suicidality Paternal Uncle    Alzheimer's disease Maternal Grandmother    Cancer Maternal Grandmother    Diabetes Paternal Grandmother    Bipolar  disorder Cousin    Cancer Other        Lung Cancer-Maternal side of family    Social History: Reviewed social history from progress note on 04/25/2021. Social History   Socioeconomic History   Marital status: Married    Spouse name: michael   Number of children: 2   Years of education: Not on file   Highest education level: Associate degree: occupational, Hotel manager, or vocational program  Occupational History   Occupation: Printmaker: Theme park manager   Occupation: on disability from work  Tobacco Use   Smoking status: Former    Packs/day: 1.50    Years: 26.00    Pack years: 39.00    Types: Cigarettes    Quit date: 07/08/2003    Years since quitting: 18.0   Smokeless tobacco: Never   Tobacco comments:  quit smoking in 2005  Vaping Use   Vaping Use: Never used  Substance and Sexual Activity   Alcohol use: No   Drug use: No   Sexual activity: Not Currently  Other Topics Concern   Not on file  Social History Narrative   Caffeine Use-no         Social Determinants of Health   Financial Resource Strain: Not on file  Food Insecurity: Not on file  Transportation Needs: Not on file  Physical Activity: Not on file  Stress: Not on file  Social Connections: Not on file    Allergies:  Allergies  Allergen Reactions   Cleocin [Clindamycin Hcl] Anaphylaxis   Latex Swelling    "SEVERE REACTION"  When received flu shot.   Ketoconazole     Metabolic Disorder Labs: Lab Results  Component Value Date   HGBA1C 6.7 (A) 04/01/2019   MPG 240 06/18/2018   MPG 217 01/12/2018   No results found for: PROLACTIN Lab Results  Component Value Date   CHOL  11/24/2009    143        ATP III CLASSIFICATION:  <200     mg/dL   Desirable  200-239  mg/dL   Borderline High  >=240    mg/dL   High          TRIG 110 11/24/2009   HDL 35 (L) 11/24/2009   CHOLHDL 4.1 11/24/2009   VLDL 22 11/24/2009   LDLCALC  11/24/2009    86        Total Cholesterol/HDL:CHD  Risk Coronary Heart Disease Risk Table                     Men   Women  1/2 Average Risk   3.4   3.3  Average Risk       5.0   4.4  2 X Average Risk   9.6   7.1  3 X Average Risk  23.4   11.0        Use the calculated Patient Ratio above and the CHD Risk Table to determine the patient's CHD Risk.        ATP III CLASSIFICATION (LDL):  <100     mg/dL   Optimal  100-129  mg/dL   Near or Above                    Optimal  130-159  mg/dL   Borderline  160-189  mg/dL   High  >190     mg/dL   Very High   Lab Results  Component Value Date   TSH 1.38 05/01/2021   TSH 2.48 12/17/2018    Therapeutic Level Labs: No results found for: LITHIUM No results found for: VALPROATE No components found for:  CBMZ  Current Medications: Current Outpatient Medications  Medication Sig Dispense Refill   albuterol (PROVENTIL HFA;VENTOLIN HFA) 108 (90 BASE) MCG/ACT inhaler Inhale 2 puffs into the lungs every 6 (six) hours as needed for wheezing.     amitriptyline (ELAVIL) 25 MG tablet TAKE 1 TABLET BY MOUTH EVERYDAY AT BEDTIME 90 tablet 3   B-D ULTRAFINE III SHORT PEN 31G X 8 MM MISC USE WITH TRESIBA AT BEDTIME 100 each 3   benztropine (COGENTIN) 0.5 MG tablet Take 1 tablet (0.5 mg total) by mouth at bedtime as needed. For side effects of seroquel 30 tablet 1   divalproex (DEPAKOTE) 500 MG DR tablet Take 1 tablet (500 mg total) by mouth 2 (  two) times daily. Take 1 tablet daily at 9 AM and at bedtime 180 tablet 0   glipiZIDE (GLUCOTROL XL) 5 MG 24 hr tablet TAKE 1 TABLET BY MOUTH EVERY DAY WITH BREAKFAST 30 tablet 0   hydrOXYzine (VISTARIL) 25 MG capsule TAKE 1 CAPSULE (25 MG TOTAL) BY MOUTH TWICE A DAY AS NEEDED 180 capsule 0   levothyroxine (SYNTHROID, LEVOTHROID) 75 MCG tablet Take 75 mcg by mouth daily before breakfast.     lisinopril (PRINIVIL,ZESTRIL) 5 MG tablet Take 5 mg by mouth daily.     LORazepam (ATIVAN) 1 MG tablet Take 2 tablets (2 mg total) by mouth 2 (two) times daily. (Patient taking  differently: Take 2 mg by mouth 2 (two) times daily. Takes once a month) 60 tablet 0   metFORMIN (GLUCOPHAGE-XR) 500 MG 24 hr tablet TAKE 1 TABLET (500 MG TOTAL) BY MOUTH 2 (TWO) TIMES DAILY AFTER A MEAL. 60 tablet 0   norethindrone (AYGESTIN) 5 MG tablet Take 2.5 mg by mouth daily.     pregabalin (LYRICA) 150 MG capsule Take 150 mg by mouth at bedtime. For nerve pain      QUEtiapine (SEROQUEL XR) 50 MG TB24 24 hr tablet Take 2 tablets (100 mg total) by mouth at bedtime. 180 tablet 0   SUMAtriptan (IMITREX) 50 MG tablet Take 1 tablet (50 mg total) by mouth every 2 (two) hours as needed for migraine. May repeat in 2 hours if headache persists or recurs. 10 tablet 6   No current facility-administered medications for this visit.   Facility-Administered Medications Ordered in Other Visits  Medication Dose Route Frequency Provider Last Rate Last Admin   indocyanine green (IC-GREEN) injection 75 mg  75 mg Intravenous Once Ashok Pall, MD         Musculoskeletal: Strength & Muscle Tone:  UTA Gait & Station:  Seated Patient leans: N/A  Psychiatric Specialty Exam: Review of Systems  Psychiatric/Behavioral:  Positive for sleep disturbance. The patient is nervous/anxious.   All other systems reviewed and are negative.  There were no vitals taken for this visit.There is no height or weight on file to calculate BMI.  General Appearance: Casual  Eye Contact:  Fair  Speech:  Normal Rate  Volume:  Normal  Mood:  Anxious  Affect:  Congruent  Thought Process:  Goal Directed and Descriptions of Associations: Intact  Orientation:  Full (Time, Place, and Person)  Thought Content: Logical   Suicidal Thoughts:  No  Homicidal Thoughts:  No  Memory:  Immediate;   Fair Recent;   Limited Remote;   Limited  Judgement:  Fair  Insight:  Fair  Psychomotor Activity:  Normal  Concentration:  Concentration: Fair and Attention Span: Fair  Recall:  [employer] of Knowledge: Fair  Language: Fair   Akathisia:  No  Handed:  Right  AIMS (if indicated): done  Assets:  Communication Skills Desire for Improvement Housing Social Support  ADL's:  Intact  Cognition: limited  Sleep:   excessive    Screenings: AIMS    Flowsheet Row Video Visit from 08/07/2021 in [facility] Video Visit from 06/26/2021 in [facility] Total Score 0 0      GAD-7    Flowsheet Row Video Visit from 08/07/2021 in [facility] Video Visit from 06/26/2021 in [facility] Office Visit from 04/25/2021 in [facility]  Total GAD-7 Score 1 2 21       PHQ2-9  Flowsheet Row Video Visit from 08/07/2021 in [facility] Video Visit from 06/26/2021 in [facility] Office Visit from 05/16/2021 in [facility] Office Visit from 04/25/2021 in [facility] from [facility]2018 [facility]  PHQ-2 Total Score 0 0 1 3 5   PHQ-9 Total Score 2 5 11 18 15       Flowsheet Row Video Visit from 08/07/2021 in [facility] Video Visit from 06/26/2021 in [facility] Office Visit from 05/16/2021 in [facility] No Risk No Risk No Risk        Assessment and Plan: TKEYA LONIE is a 51 year old Caucasian female, married, lives in [place], has a history of MDD, anxiety, multiple medical problems currently improving.  Plan as noted below.  Plan MDD mixed features in remission Depakote 500 mg p.o. twice daily Depakote level dated 05/23/2021-42.5-subtherapeutic.  Will not make any changes with the dosage since she is on multiple medications at this time. Seroquel XR 100 mg p.o. nightly Will add Cogentin 0.5 mg p.o. daily as needed for  side effects of Seroquel. Continue CBT  GAD-improving Depakote as prescribed Patient is currently on amitriptyline 25 mg p.o. nightly for headaches prescribed per neurology although it helps with anxiety also. Lorazepam 1 mg at bedtime as needed-currently limiting use and uses it once a month or so.  Panic disorder-stable Continue CBT Continue lorazepam and amitriptyline as prescribed  Follow-up in clinic as needed.  Patient is trying to establish care with a psychiatrist close to her home.  She will call back to clinic as needed.     Collaboration of Care: Collaboration of Care: Other encouraged to establish care with psychiatrist.  Patient/Guardian was advised Release of Information must be obtained prior to any record release in order to collaborate their care with an outside provider. Patient/Guardian was advised if they have not already done so to contact the registration department to sign all necessary forms in order for [place] to release information regarding their care.   Consent: Patient/Guardian gives verbal consent for treatment and assignment of benefits for services provided during this visit. Patient/Guardian expressed understanding and agreed to proceed.   This note was generated in part or whole with voice recognition software. Voice recognition is usually quite accurate but there are transcription errors that can and very often do occur. I apologize for any typographical errors that were not detected and corrected.      Ursula Alert, MD 08/07/2021, 4:59 PM

## 2021-08-27 ENCOUNTER — Ambulatory Visit: Payer: No Typology Code available for payment source | Admitting: Neurology

## 2021-09-03 ENCOUNTER — Other Ambulatory Visit: Payer: Self-pay | Admitting: Psychiatry

## 2021-09-03 DIAGNOSIS — F3342 Major depressive disorder, recurrent, in full remission: Secondary | ICD-10-CM

## 2021-10-29 ENCOUNTER — Ambulatory Visit: Payer: No Typology Code available for payment source | Admitting: Neurology

## 2022-01-16 ENCOUNTER — Encounter: Payer: Self-pay | Admitting: Neurology

## 2022-01-16 ENCOUNTER — Ambulatory Visit (INDEPENDENT_AMBULATORY_CARE_PROVIDER_SITE_OTHER): Payer: No Typology Code available for payment source | Admitting: Neurology

## 2022-01-16 VITALS — BP 143/83 | HR 68 | Ht 67.0 in | Wt 314.5 lb

## 2022-01-16 DIAGNOSIS — G43009 Migraine without aura, not intractable, without status migrainosus: Secondary | ICD-10-CM

## 2022-01-16 MED ORDER — AMITRIPTYLINE HCL 25 MG PO TABS: 25.0000 mg | ORAL_TABLET | Freq: Every day | ORAL | 3 refills | Status: DC

## 2022-01-16 NOTE — Patient Instructions (Signed)
Continue current medications  Follow up in a year or sooner if worse 

## 2022-01-16 NOTE — Progress Notes (Signed)
[facility]  PATIENT: Olivia Payne DOB: 1970/10/04  REQUESTING CLINICIAN: Richardean Chimera, MD HISTORY FROM: Patient and mother  REASON FOR VISIT: Brain fog/difficulty with concentration/ word finding difficulty   HISTORICAL  CHIEF COMPLAINT:  Chief Complaint  Patient presents with   Follow-up    Rm 12. Alone. Denies any new migraines.   INTERVAL HISTORY 01/16/2022:  Patient presents today for follow-up, last visit was in February.  At that time, we had planned to discontinue topiramate and started her on Elavil.  Since then she has reported no headaches.  She is doing extremely well, she did have 2 minor headaches which she took Advil and 4 ounce of caffeine and within 30 minutes the headaches were gone.  She did follow-up with her psychiatrist and discontinued the Ativan and the Lexapro.  Overall she is feeling the best so far.  No Other complaints or concerns.   INTERVAL HISTORY 04/29/2021:  Patient presents today for follow-up with her mother.  She did have the neuropsych testing done but it indicated that the result should be interpreted with caution as they may represent an inaccurate assessment of the patient actual cognitive abilities.  Full neuropsych testing in my chart to review.  She reported that when we increase the Topamax she did well on headache status, but she did follow-up with psychiatry who recommend to discontinue Topamax as it may contribute to her memory problem.  She was started on Depakote 250 mg 3 times daily.  Denies any side effect from the medication.  She is still having headaches and she is looking to start a new preventive medication.  Patient also reported her psychiatrist is in the process to taper her lorazepam and discontinue it.  This is causing her a lot of stress because she is having more panic attacks now which cause her headaches.  When she having panic attack, she is still taking additional Ativan.  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  Patient reported doing well prior to her AVM rupture.  In 2014 she was noted to have a right parietal AVM which was later resected.  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  She reported from April to November of the year she has been sick with multiple infections.  During this time, she has developed difficulty concentrating, she reports she cannot focus, she cannot think straight, she had difficulty reading email at work and not understanding what is going on.  She said that her work performance has markedly decreased to the point that her boss called her to tell her that she is not the same person anymore and then she need to go on disability. She was terminated from her job.  On top of her difficulty of work she also complains of daily headaches.  She had a history of migraine, was initially put on topiramate and Lyrica, since then her migraine headaches has improved but now she is getting morning headaches.  She reported headaches usually happen to her when people start to talk to heror she will start doing some  work upon waking up.  But if left alone she will not develop any headache in the morning. She also reports a lot of stress, she is not bankruptcy, she reported "everybody drives and driving her crazy", she is more forgetful than before and even she forgets to take her meds.  She has follow-up with her primary care doctor and is pending a neuropsych evaluation.  She does not have a regular psychiatrist or  therapist. Patient also reported history of stroke and a history of aneurysm repair with a clip.  Per chart review via epic, I do not see any history of aneurysm repair with a clip.  She had a IR angiogram in 2017 without mention of any intracranial aneurysm.   She was told that she cannot do a brain MRI due to MRI tech not knowing what type of aneurysm clip she had.  Patient does not know which type she have and she reported that she does not have the card, very difficult to verify if the clip is MRI compatible.  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     Headache History and Characteristics: Onset: 2014 after cranioplasty  Location: right side but still all over  Quality:  pulsating throbbing pain  Intensity: /10.  Duration: all day  Migrainous Features: None.  Aura: No  History of brain injury or tumor: Yes, bleeding AVM   Family history: Motion sickness: no Cardiac history: no  OTC: Aleve and diet coke  Caffeine: No  Sleep: Horrible, cant sleep, up all night sometimes working all night  Mood/ Stress: high very high   Prior prophylaxis: Propranolol: No  Verapamil:No TCA: No Topamax: Yes  Depakote: No Effexor: No Cymbalta: No Neurontin:No  Prior abortives: Triptan: No Anti-emetic: No Steroids: No Ergotamine suppository: No    OTHER MEDICAL CONDITIONS: Right parietal cerebral aneurysm s/p resection and cranioplasty, DMII,  HTN, Migraines, Hypothyroidism, ?Sleep apnea   REVIEW OF SYSTEMS: Full 14 system review of systems performed and negative with exception of: as noted in the HPI  ALLERGIES: Allergies  Allergen Reactions   Cleocin [Clindamycin Hcl] Anaphylaxis   Latex Swelling    "SEVERE REACTION"  When received flu shot.   Ketoconazole     HOME MEDICATIONS: Outpatient Medications Prior to Visit  Medication Sig Dispense Refill   albuterol (PROVENTIL HFA;VENTOLIN HFA) 108  (90 BASE) MCG/ACT inhaler Inhale 2 puffs into the lungs every 6 (six) hours as needed for wheezing.     B-D ULTRAFINE III SHORT PEN 31G X 8 MM MISC USE WITH TRESIBA AT BEDTIME 100 each 3   divalproex (DEPAKOTE) 500 MG DR tablet Take 1 tablet (500 mg total) by mouth 2 (two) times daily. Take 1 tablet daily at 9 AM and at bedtime 180 tablet 0   glipiZIDE (GLUCOTROL XL) 5 MG 24 hr tablet TAKE 1 TABLET BY MOUTH EVERY DAY WITH BREAKFAST 30 tablet 0   levothyroxine (SYNTHROID, LEVOTHROID) 75 MCG tablet Take 75 mcg by mouth daily before breakfast.     lisinopril (PRINIVIL,ZESTRIL) 5 MG tablet Take 5 mg by mouth daily.     metFORMIN (GLUCOPHAGE-XR) 500 MG 24 hr tablet TAKE 1 TABLET (500 MG TOTAL) BY MOUTH 2 (TWO) TIMES DAILY AFTER A MEAL. 60 tablet 0   norethindrone (AYGESTIN) 5 MG tablet Take 2.5 mg by mouth daily.     pregabalin (LYRICA) 150 MG capsule Take 150 mg by mouth at bedtime. For  nerve pain      QUEtiapine (SEROQUEL XR) 50 MG TB24 24 hr tablet Take 2 tablets (100 mg total) by mouth at bedtime. 180 tablet 0   amitriptyline (ELAVIL) 25 MG tablet TAKE 1 TABLET BY MOUTH EVERYDAY AT BEDTIME 90 tablet 3   benztropine (COGENTIN) 0.5 MG tablet Take 1 tablet (0.5 mg total) by mouth at bedtime as needed. For side effects of seroquel 30 tablet 1   hydrOXYzine (VISTARIL) 25 MG capsule TAKE 1 CAPSULE (25 MG TOTAL) BY MOUTH TWICE A DAY AS NEEDED 180 capsule 0   LORazepam (ATIVAN) 1 MG tablet Take 2 tablets (2 mg total) by mouth 2 (two) times daily. (Patient taking differently: Take 2 mg by mouth 2 (two) times daily. Takes once a month) 60 tablet 0   SUMAtriptan (IMITREX) 50 MG tablet Take 1 tablet (50 mg total) by mouth every 2 (two) hours as needed for migraine. May repeat in 2 hours if headache persists or recurs. 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    Cerebral aneurysm    Cerebral AVM    Depression    Diabetes mellitus with other specified manifestations    peripheral neuropathy   Facial pain    since intubation   GERD (gastroesophageal reflux disease)    hx   History of chicken pox    Hypertension    Hypothyroidism    Migraine    Neuropathy    OA (osteoarthritis) of knee    left knee with chronic pain   Sleep apnea    does not use Cpap   Thyroid disease     PAST SURGICAL HISTORY: Past Surgical History:  Procedure Laterality Date   cerebral arteriogram     CESAREAN SECTION  1995   CRANIOPLASTY Right 07/09/2012   Procedure: CRANIOPLASTY;  Surgeon: Carmela Hurt, MD;  Location: [facility];  Service: Neurosurgery;  Laterality: Right;  Cranioplasty with bone retrieval from abdominal pocket   CRANIOTOMY Right 05/26/2012   Procedure: CRANIECTOMY INTRACRANIAL ARTERIO-VENOUS MALFORMATION DURAL COMPLEX (AVM). PLACEMENT OF BONE FLAB IN ABDOMINAL WALL;  Surgeon: Carmela Hurt, MD;  Location: [facility];  Service: Neurosurgery;  Laterality: Right;  RIGHT Craniectomy for arteriovenous malformation resection   DILATION AND CURETTAGE OF UTERUS  06/07/2002   EYE SURGERY  01/12/2013   laser    IR GENERIC HISTORICAL  02/12/2016   IR ANGIO INTRA EXTRACRAN SEL COM CAROTID INNOMINATE BILAT MOD SED 02/12/2016 Julieanne Cotton, MD [facility]   IR GENERIC HISTORICAL  02/12/2016   IR ANGIO VERTEBRAL SEL SUBCLAVIAN INNOMINATE UNI L MOD SED 02/12/2016 Julieanne Cotton, MD [facility]   IR GENERIC HISTORICAL  02/12/2016   IR ANGIO VERTEBRAL SEL VERTEBRAL UNI R MOD SED 02/12/2016 Julieanne Cotton, MD [facility]   RADIOLOGY WITH ANESTHESIA N/A 04/20/2012   Procedure: RADIOLOGY WITH ANESTHESIA;  Surgeon: Oneal Grout, MD;  Location: [facility];  Service: Radiology;  Laterality: N/A;   RADIOLOGY WITH ANESTHESIA N/A 05/26/2012   Procedure: RADIOLOGY WITH ANESTHESIA;  Surgeon: Oneal Grout, MD;  Location:  [facility];  Service: Radiology;  Laterality: N/A;    FAMILY HISTORY: Family History  Problem Relation Age of Onset   Cancer Mother        Breast Cancer   Depression Father    Diabetes Father    Heart disease Father    Heart attack Father 1853   Hypertension Father    Heart failure Father    High Cholesterol Sister    Cancer Paternal Aunt        Ovarian Cancer   Suicidality Paternal Uncle    Alzheimer's disease Maternal Grandmother    Cancer Maternal Grandmother    Diabetes Paternal Grandmother    Bipolar disorder Cousin    Cancer Other        Lung Cancer-Maternal side of family    SOCIAL HISTORY: Social History   Socioeconomic History   Marital status: Married    Spouse name: michael   Number of children: 2   Years of education: Not on file   Highest education level: Associate degree: occupational, Scientist, product/process developmenttechnical, or vocational program  Occupational History   Occupation: Buyer, retailANALYST    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   Occupation: on disability from work  Tobacco Use   Smoking status: Former    Packs/day: 1.50    Years: 26.00    Total pack years: 39.00    Types: Cigarettes    Quit date: 07/08/2003    Years since quitting: 18.5   Smokeless tobacco: Never   Tobacco comments:    quit smoking in 2005  Vaping Use   Vaping Use: Never used  Substance and Sexual Activity   Alcohol use: No   Drug use: No   Sexual activity: Not Currently  Other Topics Concern   Not on file  Social History Narrative   Caffeine Use-no         Social Determinants of Health   Financial Resource Strain: Not on file  Food Insecurity: Not on file  Transportation Needs: Not on file  Physical Activity: Not on file  Stress: Not on file  Social Connections: Not on file  Intimate Partner Violence: Not on file    PHYSICAL  EXAM   GENERAL EXAM/CONSTITUTIONAL: Vitals:  Vitals:   01/16/22 1449  BP: (!) 143/83  Pulse: 68  Weight: (!) 314 lb 8 oz (142.7 kg)  Height: 5\' 7"  (1.702 m)     Body mass index is 49.26 kg/m. Wt Readings from Last 3 Encounters:  01/16/22 (!) 314 lb 8 oz (142.7 kg)  04/29/21 294 lb 8 oz (133.6 kg)  02/26/21 300 lb (136.1 kg)   Patient is in no distress; well developed, nourished and groomed; neck is supple  EYES: Visual fields full to confrontation, Extraocular movements intacts,   MUSCULOSKELETAL: Gait, strength, tone, movements noted in Neurologic exam below  NEUROLOGIC: MENTAL STATUS:      No data to display         awake, alert, oriented to person, place and time recent and remote memory intact Decrease attention and concentration  language fluent, comprehension intact, naming intact fund of knowledge appropriate  CRANIAL NERVE:  2nd, 3rd, 4th, 6th -visual fields full to confrontation, extraocular muscles intact, no nystagmus 5th - facial sensation symmetric 7th - facial strength symmetric 8th - hearing intact 9th - palate elevates symmetrically, uvula midline 11th - shoulder shrug symmetric 12th - tongue protrusion midline  MOTOR:  normal bulk and tone, full strength in the BUE, BLE  COORDINATION:  finger-nose-finger, fine finger movements normal  GAIT/STATION:  normal    DIAGNOSTIC DATA (LABS, IMAGING, TESTING) - I reviewed patient records, labs, notes, testing and imaging myself where available.  Lab Results  Component Value Date   WBC 12.4 (H) 02/12/2016   HGB 13.1 02/12/2016   HCT 40.1 02/12/2016   MCV 76.2 (L) 02/12/2016   PLT 315 02/12/2016      Component Value Date/Time   NA 137 12/17/2018 1346   K 4.7 12/17/2018 1346   CL 103 12/17/2018 1346   CO2 25 12/17/2018 1346   GLUCOSE 129 12/17/2018 1346   BUN 17 12/17/2018 1346   CREATININE 0.99 12/17/2018 1346   CALCIUM 9.6 12/17/2018 1346   PROT 7.5 04/25/2021 1102   ALBUMIN  3.5 04/25/2021 1102   AST 16 04/25/2021 1102   ALT 18 04/25/2021 1102   ALKPHOS 97 04/25/2021 1102   BILITOT 0.7 04/25/2021 1102   GFRNONAA 67 12/17/2018 1346   GFRAA 78 12/17/2018 1346   Lab Results  Component Value Date   CHOL  11/24/2009    143        ATP III CLASSIFICATION:  <200     mg/dL   Desirable  892-119  mg/dL   Borderline High  >=417    mg/dL   High          HDL 35 (L) 11/24/2009   LDLCALC  11/24/2009    86        Total Cholesterol/HDL:CHD Risk Coronary Heart Disease Risk Table                     Men   Women  1/2 Average Risk   3.4   3.3  Average Risk       5.0   4.4  2 X Average Risk   9.6   7.1  3 X Average Risk  23.4   11.0        Use the calculated Patient Ratio above and the CHD Risk Table to determine the patient's CHD Risk.        ATP III CLASSIFICATION (LDL):  <100     mg/dL   Optimal  408-144  mg/dL   Near or Above                    Optimal  130-159  mg/dL   Borderline  818-563  mg/dL   High  >149     mg/dL   Very High   TRIG 702 11/24/2009   CHOLHDL 4.1 11/24/2009   Lab Results  Component Value Date   HGBA1C 6.7 (A) 04/01/2019   Lab Results  Component Value Date   VITAMINB12 457 05/01/2021   Lab Results  Component Value Date   TSH 1.38 05/01/2021    Head CT 07/2012:  1.  Status post right frontal parietal cranioplasty.  2.  Interval resection of the majority of a right frontal and parietal AVM.  There is some residual intravascular coil material along the inferior right parietal and temporal lobe.  3.  Cortical and subcortical hemorrhage adjacent to the resection cavity extending into the white matter.  While this may be postoperative in nature, venous infarction is also considered.  4.  Midline shift of 4-5 mm.    ASSESSMENT AND PLAN  51 y.o. year old female with past medical history including AVM s/p repair, diabetes mellitus type 2, headaches, hypothyroidism who is presenting for follow up for migraines.  Overall doing well  since discontinuing the topiramate and starting on Elavil.  She also discontinued Ativan and Lexapro.  Denies any migraine since February.  We will continue patient on current medication I will see her in 1 year for follow-up or sooner if worse.  She voices understanding.    1. Migraine without aura and without status migrainosus, not intractable     Patient Instructions  Continue current medications  Follow up in a year or sooner if worse     No orders of the defined types were placed in this encounter.   Meds ordered this encounter  Medications   amitriptyline (ELAVIL) 25 MG tablet    Sig: Take 1 tablet (25 mg total) by mouth at bedtime.    Dispense:  90 tablet    Refill:  3    Return in about 11 months (around 12/17/2022).  I have spent a total of 20 minutes dedicated to this patient today, preparing to see patient, performing a medically appropriate examination and evaluation, ordering tests and/or medications and procedures, and counseling and educating the patient/family/caregiver; independently interpreting result and communicating results to the family/patient/caregiver; and documenting clinical information in the electronic medical record.   Windell Norfolk, MD 01/16/2022, 3:08 PM  [facility] [address] [place], [place] [address] [phone]

## 2022-07-11 ENCOUNTER — Other Ambulatory Visit (HOSPITAL_COMMUNITY): Payer: Self-pay | Admitting: Physician Assistant

## 2022-07-11 ENCOUNTER — Ambulatory Visit (HOSPITAL_COMMUNITY)
Admission: RE | Admit: 2022-07-11 | Discharge: 2022-07-11 | Disposition: A | Discharge status: Home / Self Care | Payer: No Typology Code available for payment source | Source: Ambulatory Visit | Attending: Physician Assistant | Admitting: Physician Assistant

## 2022-07-11 DIAGNOSIS — R1011 Right upper quadrant pain: Secondary | ICD-10-CM

## 2022-07-11 DIAGNOSIS — R1013 Epigastric pain: Secondary | ICD-10-CM | POA: Diagnosis present

## 2022-07-11 LAB — POCT I-STAT CREATININE: Creatinine, Ser: 0.7 mg/dL (ref 0.44–1.00)

## 2022-07-11 MED ORDER — IOHEXOL 300 MG/ML  SOLN
100.0000 mL | Freq: Once | INTRAMUSCULAR | Status: AC | PRN
  Administered 2022-07-11: 100 mL via INTRAVENOUS

## 2022-07-11 MED ORDER — IOHEXOL 9 MG/ML PO SOLN
ORAL | Status: AC
  Filled 2022-07-11: qty 1000

## 2022-08-05 NOTE — Progress Notes (Unsigned)
Referring Provider:  Richardean Chimera, MD Primary Care Physician:  Richardean Chimera, MD Primary Gastroenterologist:  Dr. Marletta Lor  Chief Complaint  Patient presents with   Pancreatitis    Pancreatitis, swelling in stomach area.     HPI:   Olivia Payne is a 52 y.o. female presenting today at the request of  Richardean Chimera, MD for pancreatitis.   CT A/P with contrast on file 07/11/2022 with question subtle infiltrative changes at pancreatic head/uncinate; recommend correlation with serum lipase to exclude pancreatitis.  Lipase was elevated at 445.  LFTs, calcium were normal.  Glucose elevated at 317.  Today:  Patient reports she has chronic history of postprandial abdominal bloating in the epigastric and LUQ region.  No real abdominal pain, more pressure.  When she developed pancreatitis she had acute onset severe upper abdominal pain radiating to her back with associated nausea and 1 episode of vomiting.  She also has some reflux symptoms with pancreatitis, but not currently.  States right before the attack, she had been started on Ozempic and had taken 4 doses.  She was also started on atorvastatin about a month ago.  She has a lot of questions about what she should and should not be eating currently.  If she eats something fried/fatty she will get a little discomfort in her upper abdomen, but not as severe as when she had pancreatitis.  She is also having some issues with mild constipation.  No diarrhea. Bowels move every other day. Stools can be hard at times. Has stool softeners, but isn't taking this. No brbpr or melena.   Colonoscopy years ago at [facility] and was told it was normal. More than 10 years ago.   No alcohol.  Takes ibuprofen or Aleve for knee pain or headache, maybe once a month.   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    Cerebral aneurysm    Cerebral AVM     Depression    Diabetes mellitus with other specified manifestations    peripheral neuropathy   Facial pain    since intubation   GERD (gastroesophageal reflux disease)    hx   History of chicken pox    Hypertension    Hypothyroidism    Migraine    Neuropathy    OA (osteoarthritis) of knee    left knee with chronic pain   Sleep apnea    does not use Cpap   Thyroid disease     Past Surgical History:  Procedure Laterality Date   cerebral arteriogram     CESAREAN SECTION  1995   CRANIOPLASTY Right 07/09/2012   Procedure: CRANIOPLASTY;  Surgeon: Carmela Hurt, MD;  Location: [facility];  Service: Neurosurgery;  Laterality: Right;  Cranioplasty with bone retrieval from abdominal pocket   CRANIOTOMY Right 05/26/2012   Procedure: CRANIECTOMY INTRACRANIAL ARTERIO-VENOUS MALFORMATION DURAL COMPLEX (AVM). PLACEMENT OF BONE FLAB IN ABDOMINAL WALL;  Surgeon: Carmela Hurt, MD;  Location: [facility];  Service: Neurosurgery;  Laterality: Right;  RIGHT Craniectomy for arteriovenous malformation resection   DILATION AND CURETTAGE OF UTERUS  06/07/2002   EYE SURGERY  01/12/2013   laser    IR GENERIC HISTORICAL  02/12/2016   IR ANGIO INTRA EXTRACRAN SEL COM CAROTID INNOMINATE BILAT MOD SED 02/12/2016 Julieanne Cotton, MD [facility]   IR GENERIC  HISTORICAL  02/12/2016   IR ANGIO VERTEBRAL SEL SUBCLAVIAN INNOMINATE UNI L MOD SED 02/12/2016 Julieanne Cotton, MD [facility]   IR GENERIC HISTORICAL  02/12/2016   IR ANGIO VERTEBRAL SEL VERTEBRAL UNI R MOD SED 02/12/2016 Julieanne Cotton, MD [facility]   RADIOLOGY WITH ANESTHESIA N/A 04/20/2012   Procedure: RADIOLOGY WITH ANESTHESIA;  Surgeon: Oneal Grout, MD;  Location: [facility];  Service: Radiology;  Laterality: N/A;   RADIOLOGY WITH ANESTHESIA N/A 05/26/2012   Procedure: RADIOLOGY WITH ANESTHESIA;  Surgeon: Oneal Grout, MD;  Location: [facility];  Service: Radiology;  Laterality: N/A;    Current Outpatient Medications   Medication Sig Dispense Refill   albuterol (PROVENTIL HFA;VENTOLIN HFA) 108 (90 BASE) MCG/ACT inhaler Inhale 2 puffs into the lungs every 6 (six) hours as needed for wheezing.     amitriptyline (ELAVIL) 25 MG tablet Take 1 tablet (25 mg total) by mouth at bedtime. 90 tablet 3   atorvastatin (LIPITOR) 10 MG tablet Take 10 mg by mouth daily.     B-D ULTRAFINE III SHORT PEN 31G X 8 MM MISC USE WITH TRESIBA AT BEDTIME 100 each 3   divalproex (DEPAKOTE) 500 MG DR tablet Take 1 tablet (500 mg total) by mouth 2 (two) times daily. Take 1 tablet daily at 9 AM and at bedtime 180 tablet 0   glipiZIDE (GLUCOTROL XL) 5 MG 24 hr tablet TAKE 1 TABLET BY MOUTH EVERY DAY WITH BREAKFAST 30 tablet 0   LANTUS SOLOSTAR 100 UNIT/ML Solostar Pen SMARTSIG:60 Unit(s) SUB-Q Daily     levothyroxine (SYNTHROID, LEVOTHROID) 75 MCG tablet Take 75 mcg by mouth daily before breakfast.     lipase/protease/amylase (CREON) 36000 UNITS CPEP capsule Take 2 capsules (72,000 Units total) by mouth 3 (three) times daily with meals. May also take 1 capsule (36,000 Units total) as needed (with snacks). 240 capsule 3   lisinopril (PRINIVIL,ZESTRIL) 5 MG tablet Take 5 mg by mouth daily.     metFORMIN (GLUCOPHAGE-XR) 500 MG 24 hr tablet TAKE 1 TABLET (500 MG TOTAL) BY MOUTH 2 (TWO) TIMES DAILY AFTER A MEAL. 60 tablet 0   norethindrone (AYGESTIN) 5 MG tablet Take 2.5 mg by mouth daily.     pantoprazole (PROTONIX) 40 MG tablet Take 1 tablet (40 mg total) by mouth daily before breakfast. 30 tablet 3   pregabalin (LYRICA) 150 MG capsule Take 150 mg by mouth at bedtime. For nerve pain      QUEtiapine (SEROQUEL XR) 50 MG TB24 24 hr tablet Take 2 tablets (100 mg total) by mouth at bedtime. 180 tablet 0   No current facility-administered medications for this visit.   Facility-Administered Medications Ordered in Other Visits  Medication Dose Route Frequency Provider Last Rate Last Admin   indocyanine green (IC-GREEN) injection 75 mg  75 mg  Intravenous Once Coletta Memos, MD        Allergies as of 08/07/2022 - Review Complete 08/07/2022  Allergen Reaction Noted   Cleocin [clindamycin hcl] Anaphylaxis 02/22/2013   Latex Swelling 08/01/2011   Ketoconazole  04/30/2018    Family History  Problem Relation Age of Onset   Cancer Mother        Breast Cancer   Depression Father    Diabetes Father    Heart disease Father    Heart attack Father 50   Hypertension Father    Heart failure Father    High Cholesterol Sister    Alzheimer's disease Maternal Grandmother    Cancer Maternal Grandmother  Diabetes Paternal Grandmother    Cancer Paternal Aunt        Ovarian Cancer   Suicidality Paternal Uncle    Bipolar disorder Cousin    Cancer Other        Lung Cancer-Maternal side of family   Colon cancer Neg Hx     Social History   Socioeconomic History   Marital status: Married    Spouse name: michael   Number of children: 2   Years of education: Not on file   Highest education level: Associate degree: occupational, Scientist, product/process development, or vocational program  Occupational History   Occupation: Buyer, retail: Advertising copywriter   Occupation: on disability from work  Tobacco Use   Smoking status: Former    Packs/day: 1.50    Years: 26.00    Additional pack years: 0.00    Total pack years: 39.00    Types: Cigarettes    Quit date: 07/08/2003    Years since quitting: 19.0   Smokeless tobacco: Never   Tobacco comments:    quit smoking in 2005  Vaping Use   Vaping Use: Never used  Substance and Sexual Activity   Alcohol use: No   Drug use: No   Sexual activity: Not Currently  Other Topics Concern   Not on file  Social History Narrative   Caffeine Use-no         Social Determinants of Health   Financial Resource Strain: Not on file  Food Insecurity: Not on file  Transportation Needs: Not on file  Physical Activity: Not on file  Stress: Not on file  Social Connections: Not on file  Intimate Partner Violence:  Not on file    Review of Systems: Gen: Denies any fever, chills, cold or flulike symptoms, presyncope, syncope. CV: Denies chest pain, heart palpitations. Resp: Denies shortness of breath, cough. GI: See HPI GU : Denies urinary burning, urinary frequency, urinary hesitancy MS: Denies joint pain. Derm: Denies rash. Psych: Denies depression, anxiety. Heme: See HPI  Physical Exam: BP 139/82 (BP Location: Right Arm, Patient Position: Sitting, Cuff Size: Large)   Pulse (!) 109   Temp 97.7 F (36.5 C) (Temporal)   Ht 5\' 7"  (1.702 m)   Wt (!) 307 lb 6.4 oz (139.4 kg)   SpO2 97%   BMI 48.15 kg/m  General:   Alert and oriented. Pleasant and cooperative. Well-nourished and well-developed.  Head:  Normocephalic and atraumatic. Eyes:  Without icterus, sclera clear and conjunctiva pink.  Ears:  Normal auditory acuity. Lungs:  Clear to auscultation bilaterally. No wheezes, rales, or rhonchi. No distress.  Heart:  S1, S2 present without murmurs appreciated.  Abdomen:  +BS, soft, and non-distended.  Mild TTP in LUQ region.  No HSM noted. No guarding or rebound. No masses appreciated.  Rectal:  Deferred  Msk:  Symmetrical without gross deformities. Normal posture. Extremities:  Without edema. Neurologic:  Alert and  oriented x4;  grossly normal neurologically. Skin:  Intact without significant lesions or rashes. Psych:  Normal mood and affect.    Assessment:  52 year old female with history of hypothyroidism, diabetes, HTN, anxiety, depression, cerebral AVM, presenting today at the request of Dr. Reuel Boom for follow-up of pancreatitis.  Acute pancreatitis: CT A/P 07/11/2022 with question subtle infiltrative changes at pancreatic head/uncinate. Lipase was elevated at 445, consistent with pancreatitis. No gallstones evident on CT. LFTs and calcium wnl.  Clinically, patient is feeling much better.  She is having some postprandial upper abdominal bloating, but this is more  chronic. No significant  pain, nausea, vomiting.  No alarm symptoms.   Etiology likely secondary to Ozempic as she started this about 4 weeks prior to pancreatitis.  She also started atorvastatin which could also cause pancreatitis though this falls in class III making it a little less likely.  Ozempic has been discontinued.  She has continued on atorvastatin which I think is okay at this point.  She denies alcohol or tobacco use.  No regular NSAID use.  Will plan to repeat CT with pancreatic protocol in 2 weeks to follow-up and rule out underlying pancreatic lesion.  I will also start her on Zenpep as well as daily PPI to see if this helps with her postprandial bloating/pressure in the upper abdomen.  Also recommended healthy diet, low in fat.  If postprandial upper abdominal bloating/pressure continues, may need to consider EGD for further evaluation to rule out gastritis, duodenitis, PUD, and H pylori.   Constipation: Mild.  No alarm symptoms.  Recommend starting Colace.  Colon cancer screening: Reports having a colonoscopy many years ago was normal at [facility].  We will request records to determine timing of next colonoscopy.   Plan:  CT abdomen with pancreatic protocol. Diet recommendations: Low-fat diet. Avoid fried and processed foods. Avoid red meats and pork. Meats should be baked, boiled, broiled. Consume fruits and vegetables Drink at least 64 ounces of water daily. Start Creon 2 capsules with meals and 1 capsule with snacks. Start pantoprazole 40 mg daily. Start Colace 100 mg daily.  Can increase to 200 mg or 300 mg daily if needed. Request colonoscopy records from [facility]. Follow-up in 4 weeks.   Ermalinda Memos, PA-C [facility] 08/07/2022

## 2022-08-07 ENCOUNTER — Encounter: Payer: Self-pay | Admitting: Gastroenterology

## 2022-08-07 ENCOUNTER — Ambulatory Visit: Payer: No Typology Code available for payment source | Admitting: Gastroenterology

## 2022-08-07 ENCOUNTER — Telehealth: Payer: Self-pay | Admitting: *Deleted

## 2022-08-07 ENCOUNTER — Telehealth: Payer: Self-pay | Admitting: Gastroenterology

## 2022-08-07 VITALS — BP 139/82 | HR 109 | Temp 97.7°F | Ht 67.0 in | Wt 307.4 lb

## 2022-08-07 DIAGNOSIS — K5900 Constipation, unspecified: Secondary | ICD-10-CM

## 2022-08-07 DIAGNOSIS — K859 Acute pancreatitis without necrosis or infection, unspecified: Secondary | ICD-10-CM

## 2022-08-07 DIAGNOSIS — R140 Abdominal distension (gaseous): Secondary | ICD-10-CM

## 2022-08-07 MED ORDER — PANCRELIPASE (LIP-PROT-AMYL) 36000-114000 UNITS PO CPEP: ORAL_CAPSULE | ORAL | 3 refills | Status: AC

## 2022-08-07 MED ORDER — PANTOPRAZOLE SODIUM 40 MG PO TBEC
40.0000 mg | DELAYED_RELEASE_TABLET | Freq: Every day | ORAL | 3 refills | Status: DC
Start: 2022-08-07 — End: 2022-11-09

## 2022-08-07 NOTE — Patient Instructions (Addendum)
We are arrange for you to have a CT scan of your abdomen to follow-up on pancreatitis.   As we discussed follow a healthy diet low in fat.  In general, you should stick to poultry or fish that is baked, boiled, broiled.  Avoid red meats and pork. Eat fruits and vegetables. Avoid processed and fried foods.  Start Creon 2 capsules with meals and 1 capsule with snacks.  These are pancreatic enzymes to help digest your food.  Start pantoprazole 40 mg daily.  This is to suppress the acid in your stomach and help with any irritation that has been caused by pancreatitis.  For constipation:   Start Colace (docusate sodium) 100 mg daily.  You can increase to 200 mg daily if needed.  Max dose is going to be 300 mg/day. Be sure to drink at least 64 ounces of water daily.  We will request your colonoscopy records from [facility].  We will follow-up with you in the office in about 4 weeks.  Do not hesitate to call if you have any worsening symptoms.  It was nice to meet you today!  Ermalinda Memos, PA-C [facility]

## 2022-08-07 NOTE — Telephone Encounter (Signed)
PA submitted via evicore for CT ABD. Pending review. Case# [id number]

## 2022-08-07 NOTE — Telephone Encounter (Signed)
Darl Pikes, can you request colonoscopy records from [facility]?

## 2022-08-08 NOTE — Telephone Encounter (Signed)
PA still pending review.

## 2022-08-11 NOTE — Telephone Encounter (Signed)
PA still pending medical review.

## 2022-08-12 NOTE — Telephone Encounter (Signed)
Can we file an appeal for this?

## 2022-08-12 NOTE — Telephone Encounter (Signed)
CT was denied per evicore "A study similar to the one requested has recently been performed. The results of that study  showed your doctor what they needed to see in order to treat your problem. No additional  imaging is needed at this time.  This finding was based on review of eviCore Abdomen Imaging Guidelines Section(s):  Acute Pancreatitis (AB 33.1) and 1.0 General Guidelines and Preface to the Imaging  Guidelines, section Preface-3.1 Clinical Information."

## 2022-08-12 NOTE — Telephone Encounter (Signed)
Peer to Peer SCHEDULED Mon 08/18/22 4:30 pm EDT With Amada Kingfisher

## 2022-08-17 NOTE — Telephone Encounter (Signed)
Received colonoscopy records from [facility].  Colonoscopy completed 01/24/2013 by Dr. Teena Dunk.  She was found to have a sessile polyp in the descending colon that was removed.  Pathology was not received.  Recommendation at the time of the colonoscopy was to repeat exam in 5 years.  We can discuss scheduling colonoscopy at her follow-up visit.  Darl Pikes, can we request colonoscopy polyp pathology from November 2014?

## 2022-08-18 NOTE — Telephone Encounter (Signed)
Peer to peer completed.  PA [id number] Approved from 08/18/22-02/14/23

## 2022-08-19 NOTE — Telephone Encounter (Signed)
LMOVM to call back for pt to give CT appt info.  6/24, arrival 930am, npo 4 hrs prior

## 2022-08-19 NOTE — Addendum Note (Signed)
Addended by: Armstead Peaks on: 08/19/2022 07:52 AM   Modules accepted: Orders

## 2022-08-20 NOTE — Telephone Encounter (Signed)
LMOVM to call back 

## 2022-08-21 NOTE — Telephone Encounter (Signed)
Can you mail letter and send information in MyChart as well?

## 2022-08-21 NOTE — Telephone Encounter (Signed)
Pt scheduled for Monday. She won't receive letter in time. She has not been active on Mychart since beginning may. I did leave a detail VM with appt details.

## 2022-08-21 NOTE — Telephone Encounter (Signed)
Noted  

## 2022-08-21 NOTE — Telephone Encounter (Signed)
Called pt again and #'s listed, left detailed message with CT appt details as I have tried several times to get in touch with her. FYI to [employer]

## 2022-08-25 ENCOUNTER — Ambulatory Visit (HOSPITAL_COMMUNITY): Payer: No Typology Code available for payment source

## 2022-08-28 NOTE — Telephone Encounter (Signed)
Sent a request to [facility] for path report from Nov 2014

## 2022-08-28 NOTE — Telephone Encounter (Signed)
Pathology report received.  She had tubular adenoma removed from her colon.  Will discuss scheduling colonoscopy at her follow-up.

## 2022-09-10 ENCOUNTER — Ambulatory Visit: Payer: No Typology Code available for payment source | Admitting: Gastroenterology

## 2022-09-11 ENCOUNTER — Ambulatory Visit: Payer: No Typology Code available for payment source | Admitting: Gastroenterology

## 2022-11-03 ENCOUNTER — Other Ambulatory Visit: Payer: Self-pay | Admitting: Gastroenterology

## 2022-11-03 DIAGNOSIS — R140 Abdominal distension (gaseous): Secondary | ICD-10-CM

## 2022-11-03 DIAGNOSIS — K859 Acute pancreatitis without necrosis or infection, unspecified: Secondary | ICD-10-CM

## 2022-12-09 ENCOUNTER — Ambulatory Visit: Payer: No Typology Code available for payment source | Admitting: Orthopedic Surgery

## 2022-12-09 ENCOUNTER — Ambulatory Visit: Payer: No Typology Code available for payment source | Admitting: Neurology

## 2022-12-09 ENCOUNTER — Encounter: Payer: Self-pay | Admitting: Orthopedic Surgery

## 2022-12-09 VITALS — BP 156/94 | HR 102 | Ht 67.0 in | Wt 304.0 lb

## 2022-12-09 DIAGNOSIS — M7502 Adhesive capsulitis of left shoulder: Secondary | ICD-10-CM | POA: Diagnosis not present

## 2022-12-09 NOTE — Patient Instructions (Signed)

## 2022-12-09 NOTE — Progress Notes (Signed)
New Patient Visit  Assessment: Olivia Payne is a 52 y.o. female with the following: 1. Adhesive capsulitis of left shoulder  Plan: Gennell How [employer] has pain and stiffness in her left shoulder.  No specific injury.  Presentation is consistent with adhesive capsulitis.  Radiographs are negative for acute injury.  At this point, I recommended a high-volume steroid injection of the left glenohumeral joint..  This was completed in clinic today.  As her pain improves, I have encouraged her to start working on range of motion.  If she continues to have issues, we will consider a referral to see Dr. Shon Baton for additional injections in the glenohumeral joint.  Procedure note injection - Left shoulder, ultrasound guidance   Verbal consent was obtained to inject the Left shoulder, glenohumeral joint  Timeout was completed to confirm the site of injection.   Using the ultrasound, the rotator cuff tendons were identified.  The joint space was also identified. The skin was prepped with alcohol and ethyl chloride was sprayed at the injection site.  A 21-gauge needle was used to inject 40 mg of Depo-Medrol and 1% lidocaine (4 cc)and 5 cc normal saline into the glenohumeral joint space of the Left shoulder using a posterolateral approach.  The needle was visualized entering the glenohumeral joint, and the medication was also visualized. There were no complications.  A sterile bandage was applied.   Note: In order to accurately identify the placement of the needle, ultrasound was required, to increase the accuracy, and specificity of the injection.   Follow-up: Return in about 4 weeks (around 01/06/2023).  Subjective:  Chief Complaint  Patient presents with   Shoulder Pain    R shoulder pain and limited ROM for 2 mos. Pt states she feels much better after a does of prednisone and meloxicam but is still very limited.     History of Present Illness: Olivia Payne is a 52 y.o. female who has been  referred by  Wille Glaser, PA-C for evaluation of left shoulder pain.  She is right-hand dominant.  She has had pain in the left shoulder for approximately 3 months.  No specific injury.  She notes progressively worsening pain, and inability to externally rotate the left arm.  She is a diabetic.  She notes some improvement since a short course of prednisone, taken approximate 1 month ago.  No numbness or tingling.   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    Cerebral aneurysm    Cerebral AVM    Depression    Diabetes mellitus with other specified manifestations    peripheral neuropathy   Facial pain    since intubation   GERD (gastroesophageal reflux disease)    hx   History of chicken pox    Hypertension    Hypothyroidism    Migraine    Neuropathy    OA (osteoarthritis) of knee    left knee with chronic pain   Sleep apnea    does not use Cpap   Thyroid disease     Past Surgical History:  Procedure Laterality Date   cerebral arteriogram     CESAREAN SECTION  1995   CRANIOPLASTY Right 07/09/2012   Procedure: CRANIOPLASTY;  Surgeon: Carmela Hurt, MD;  Location: [facility];  Service: Neurosurgery;  Laterality: Right;  Cranioplasty with bone retrieval from abdominal pocket   CRANIOTOMY Right 05/26/2012   Procedure: CRANIECTOMY INTRACRANIAL ARTERIO-VENOUS MALFORMATION DURAL COMPLEX (AVM). PLACEMENT OF BONE FLAB IN ABDOMINAL WALL;  Surgeon: Carmela Hurt, MD;  Location: [facility];  Service: Neurosurgery;  Laterality: Right;  RIGHT Craniectomy for arteriovenous malformation resection   DILATION AND CURETTAGE OF UTERUS  06/07/2002   EYE SURGERY  01/12/2013   laser    IR GENERIC HISTORICAL   02/12/2016   IR ANGIO INTRA EXTRACRAN SEL COM CAROTID INNOMINATE BILAT MOD SED 02/12/2016 Julieanne Cotton, MD [facility]   IR GENERIC HISTORICAL  02/12/2016   IR ANGIO VERTEBRAL SEL SUBCLAVIAN INNOMINATE UNI L MOD SED 02/12/2016 Julieanne Cotton, MD [facility]   IR GENERIC HISTORICAL  02/12/2016   IR ANGIO VERTEBRAL SEL VERTEBRAL UNI R MOD SED 02/12/2016 Julieanne Cotton, MD [facility]   RADIOLOGY WITH ANESTHESIA N/A 04/20/2012   Procedure: RADIOLOGY WITH ANESTHESIA;  Surgeon: Oneal Grout, MD;  Location: [facility];  Service: Radiology;  Laterality: N/A;   RADIOLOGY WITH ANESTHESIA N/A 05/26/2012   Procedure: RADIOLOGY WITH ANESTHESIA;  Surgeon: Oneal Grout, MD;  Location: [facility];  Service: Radiology;  Laterality: N/A;    Family History  Problem Relation Age of Onset   Cancer Mother        Breast Cancer   Depression Father    Diabetes Father    Heart disease Father    Heart attack Father 90   Hypertension Father    Heart failure Father    High Cholesterol Sister    Alzheimer's disease Maternal Grandmother    Cancer Maternal Grandmother    Diabetes Paternal Grandmother    Cancer Paternal Aunt        Ovarian Cancer   Suicidality Paternal Uncle    Bipolar disorder Cousin    Cancer Other        Lung Cancer-Maternal side of family   Colon cancer Neg Hx    Social History   Tobacco Use   Smoking status: Former    Current packs/day: 0.00    Average packs/day: 1.5 packs/day for 26.0 years (39.0 ttl pk-yrs)    Types: Cigarettes    Start date: 07/07/1977    Quit date: 07/08/2003    Years since quitting: 19.4   Smokeless tobacco: Never   Tobacco comments:    quit smoking in 2005  Vaping Use   Vaping status: Never Used  Substance Use Topics   Alcohol use: No   Drug use: No    Allergies  Allergen Reactions   Cleocin [Clindamycin Hcl] Anaphylaxis   Latex Swelling    "SEVERE REACTION"  When received flu shot.   Ketoconazole     No outpatient  medications have been marked as taking for the 12/09/22 encounter (Office Visit) with Oliver Barre, MD.    Objective: BP (!) 156/94   Pulse (!) 102   Ht 5\' 7"  (1.702 m)   Wt (!) 304 lb (137.9 kg)   BMI 47.61 kg/m   Physical Exam:  General: Alert and oriented. and No acute distress. Gait: Normal gait.  No deformity.  No redness.  Very restricted range of motion.  Internal rotation to the side of her hip.  5 to 10 degrees of external rotation at her side.  Forward flexion is limited  to below the level of her shoulder.  Fingers warm well-perfused.  IMAGING: I personally reviewed images previously obtained in clinic  X-rays left shoulder were previously obtained.  No acute injuries.          New Medications:  No orders of the defined types were placed in this encounter.     Oliver Barre, MD  12/09/2022 2:28 PM

## 2022-12-17 ENCOUNTER — Ambulatory Visit: Payer: No Typology Code available for payment source | Admitting: Neurology

## 2022-12-25 ENCOUNTER — Other Ambulatory Visit: Payer: Self-pay | Admitting: Neurology

## 2023-01-06 ENCOUNTER — Ambulatory Visit: Payer: No Typology Code available for payment source | Admitting: Orthopedic Surgery

## 2023-01-06 ENCOUNTER — Encounter: Payer: Self-pay | Admitting: Orthopedic Surgery

## 2023-01-06 VITALS — BP 151/78 | HR 100 | Ht 67.0 in | Wt 303.0 lb

## 2023-01-06 DIAGNOSIS — M7502 Adhesive capsulitis of left shoulder: Secondary | ICD-10-CM | POA: Diagnosis not present

## 2023-01-06 MED ORDER — PREDNISONE 10 MG (21) PO TBPK: ORAL_TABLET | ORAL | 0 refills | Status: AC

## 2023-01-06 NOTE — Progress Notes (Signed)
New Patient Visit  Assessment: Olivia Payne is a 52 y.o. female with the following: 1. Adhesive capsulitis of left shoulder  Plan: Olivia Payne is doing much better than the last visit, although she still has some restrictions in her range of motion.  We have previously discussed proceeding with additional injections, but she would like to avoid this currently, due to financial reasons.  I think this is reasonable.  We agreed on short course of prednisone to help with her discomfort, and potentially improve her range of motion.  She will return to clinic in approximately 2 months.  I have provided her with some additional shoulder exercises to continue to work on her range of motion.   Follow-up: Return in about 2 months (around 03/08/2023).  Subjective:  No chief complaint on file.   History of Present Illness: Olivia Payne is a 52 y.o. female who returns for evaluation of left shoulder pain.  I saw clinic about a month ago.  At that time, we proceeded with a high-volume steroid injection.  She notes improvements in her range of motion, although she does still have some pain and restrictions, especially when she attempts to reach her hand behind her back.  She has been doing exercises on her own.  She has taken NSAIDs recently.  She would like to avoid an additional injection or referral at this time, due to financial concerns.   Review of Systems: No fevers or chills No numbness or tingling No chest pain No shortness of breath No bowel or bladder dysfunction No GI distress No headaches    Objective: BP (!) 151/78   Pulse 100   Ht 5\' 7"  (1.702 m)   Wt (!) 303 lb (137.4 kg)   BMI 47.46 kg/m   Physical Exam:  General: Alert and oriented. and No acute distress. Gait: Normal gait.  Left shoulder without swelling.  She is able to forward flex the left shoulder to approximately 110 degrees, before she starts to arch her back.  15 degrees of external rotation at her side.   Internal rotation to back pocket.  IMAGING: No new imaging obtained today    New Medications:  Meds ordered this encounter  Medications   predniSONE (STERAPRED UNI-PAK 21 TAB) 10 MG (21) TBPK tablet    Sig: 10 mg DS 12 as directed    Dispense:  48 tablet    Refill:  0      Oliver Barre, MD  01/06/2023 9:12 AM

## 2023-01-06 NOTE — Patient Instructions (Signed)

## 2023-02-04 ENCOUNTER — Other Ambulatory Visit: Payer: Self-pay | Admitting: Gastroenterology

## 2023-02-04 DIAGNOSIS — R140 Abdominal distension (gaseous): Secondary | ICD-10-CM

## 2023-02-04 DIAGNOSIS — K859 Acute pancreatitis without necrosis or infection, unspecified: Secondary | ICD-10-CM

## 2023-02-04 NOTE — Telephone Encounter (Signed)
Sending in limited refills.  She is due for follow-up.  Please arrange.

## 2023-02-04 NOTE — Telephone Encounter (Signed)
I called and the patient answered and I told her the reason for my call and she hung up

## 2023-02-15 ENCOUNTER — Other Ambulatory Visit: Payer: Self-pay | Admitting: Gastroenterology

## 2023-02-15 DIAGNOSIS — R140 Abdominal distension (gaseous): Secondary | ICD-10-CM

## 2023-02-15 DIAGNOSIS — K859 Acute pancreatitis without necrosis or infection, unspecified: Secondary | ICD-10-CM

## 2023-03-13 ENCOUNTER — Ambulatory Visit: Payer: No Typology Code available for payment source | Admitting: Orthopedic Surgery

## 2023-03-17 ENCOUNTER — Ambulatory Visit: Payer: No Typology Code available for payment source | Admitting: Orthopedic Surgery

## 2023-03-17 ENCOUNTER — Encounter: Payer: Self-pay | Admitting: Orthopedic Surgery

## 2023-03-17 VITALS — BP 159/81 | HR 118 | Ht 67.0 in | Wt 299.0 lb

## 2023-03-17 DIAGNOSIS — M7502 Adhesive capsulitis of left shoulder: Secondary | ICD-10-CM

## 2023-03-17 NOTE — Progress Notes (Signed)
 Return patient Visit  Assessment: Olivia Payne is a 53 y.o. female with the following: 1. Adhesive capsulitis of left shoulder  Plan: Olivia Payne is having a lot of pain in her left shoulder.  Pain is nonradiating on her chest.  Prior ultrasound-guided injection was helpful, but when the pain returned, she wanted to try a prednisone  Dosepak.  This has not been effective.  She is hopeful for another injection today.  She continues to have restricted range of motion on physical exam.  I do think that she is dealing with adhesive capsulitis.  Condition was once again discussed.  Ultimately, she may benefit from a series of ultrasound-guided injections, but she does have some financial constraints.  She would like to proceed with another injection today.  This was completed in clinic today.  She will return to clinic as needed.   Procedure note injection - Left shoulder, ultrasound guidance   Verbal consent was obtained to inject the Left shoulder, glenohumeral joint  Timeout was completed to confirm the site of injection.   Using the ultrasound, the rotator cuff tendons were identified.  The joint space was also identified. The skin was prepped with alcohol and ethyl chloride was sprayed at the injection site.  A 21-gauge needle was used to inject 40 mg of Depo-Medrol  and 1% lidocaine  (4 cc) and 5 mL of normal saline into the glenohumeral joint space of the Left shoulder using a posterolateral approach.  The needle was visualized entering the glenohumeral joint, and the medication was also visualized. There were no complications.  A sterile bandage was applied.   Note: In order to accurately identify the placement of the needle, ultrasound was required, to increase the accuracy, and specificity of the injection.   Follow-up: Return if symptoms worsen or fail to improve.  Subjective:  Chief Complaint  Patient presents with   Shoulder Pain    L shoulder would like another injection      History of Present Illness: Olivia Payne is a 53 y.o. female who returns for evaluation of left shoulder pain.  I saw her in clinic several times for her left shoulder.  Prior ultrasound-guided injection did provide improvements in her pain.  However, the pain returned.  She tried a prednisone  Dosepak, which worsened her symptoms.  She continues to have restricted motion.  She is worried about her finances, but states her pain is so severe, that she would like to proceed with an injection.  Review of Systems: No fevers or chills No numbness or tingling No chest pain No shortness of breath No bowel or bladder dysfunction No GI distress No headaches    Objective: BP (!) 159/81   Pulse (!) 118   Ht 5' 7 (1.702 m)   Wt 299 lb (135.6 kg)   BMI 46.83 kg/m   Physical Exam:  General: Alert and oriented. and No acute distress. Gait: Normal gait.  Left shoulder without swelling.  She is able to forward flex the left shoulder to approximately 110 degrees, before she starts to arch her back.  15 degrees of external rotation at her side.  Internal rotation to back pocket.  IMAGING: No new imaging obtained today    New Medications:  No orders of the defined types were placed in this encounter.     Oneil DELENA Horde, MD  03/17/2023 11:03 AM

## 2023-03-17 NOTE — Patient Instructions (Signed)

## 2023-05-11 ENCOUNTER — Other Ambulatory Visit: Payer: Self-pay | Admitting: Gastroenterology

## 2023-05-11 DIAGNOSIS — K859 Acute pancreatitis without necrosis or infection, unspecified: Secondary | ICD-10-CM

## 2023-05-11 DIAGNOSIS — R140 Abdominal distension (gaseous): Secondary | ICD-10-CM

## 2023-05-12 NOTE — Telephone Encounter (Signed)
 Recommend OV to determine if refills are needed.

## 2023-05-15 NOTE — Telephone Encounter (Signed)
 Left patient a message to schedule ov to determine if refills on Pantoprazole are needed per Ermalinda Memos, PA_C

## 2023-06-30 ENCOUNTER — Ambulatory Visit (INDEPENDENT_AMBULATORY_CARE_PROVIDER_SITE_OTHER): Payer: No Typology Code available for payment source | Admitting: Orthopedic Surgery

## 2023-06-30 ENCOUNTER — Encounter: Payer: Self-pay | Admitting: Orthopedic Surgery

## 2023-06-30 DIAGNOSIS — M7502 Adhesive capsulitis of left shoulder: Secondary | ICD-10-CM | POA: Diagnosis not present

## 2023-06-30 MED ORDER — METHYLPREDNISOLONE ACETATE 40 MG/ML IJ SUSP
40.0000 mg | Freq: Once | INTRAMUSCULAR | Status: AC
  Administered 2023-06-30: 40 mg via INTRA_ARTICULAR

## 2023-06-30 NOTE — Progress Notes (Signed)
 Return patient Visit  Assessment: Olivia Payne is a 53 y.o. female with the following: 1. Adhesive capsulitis of left shoulder  Plan: Olivia Payne continues to have pain and restricted motion of the left shoulder.  She does feel as though she is improving, but most recent injection has worn off.  She is interested in another injection today.  Once again, we discussed the possibility of referral for more aggressive treatments for adhesive capsulitis.  She has financial constraints, and would prefer to continue with the current regimen.  We proceeded to complete a high-volume steroid injection in clinic today.  She will continue to work on her range of motion.  She will follow-up as needed.   Procedure note injection - Left shoulder, ultrasound guidance   Verbal consent was obtained to inject the Left shoulder, glenohumeral joint  Timeout was completed to confirm the site of injection.   Using the ultrasound, the rotator cuff tendons were identified.  The joint space was also identified. The skin was prepped with alcohol and ethyl chloride was sprayed at the injection site.  A 21-gauge needle was used to inject 40 mg of Depo-Medrol  and 1% lidocaine  (4 cc) and 8 mL of normal saline into the glenohumeral joint space of the Left shoulder using a posterolateral approach.  The needle was visualized entering the glenohumeral joint, and the medication was also visualized. There were no complications.  A sterile bandage was applied.   Note: In order to accurately identify the placement of the needle, ultrasound was required, to increase the accuracy, and specificity of the injection.   Follow-up: No follow-ups on file.  Subjective:  Chief Complaint  Patient presents with   Injections    Left shoulder injection    History of Present Illness: Olivia Payne is a 53 y.o. female who returns for evaluation of left shoulder pain.  I have seen her in clinic several times for her left shoulder.   She most recently had an injection a little over 3 months ago.  This improved her pain, as well as her motion.  She does continue to have restrictions in her motion.  She also has pain.  She states the shoulder feels as though it is "locking up".  Throughout the day, she states her motion improves.  She is still limited in her ability to travel to [place].  She would like to continue to receive treatment here.  We discussed the limitations.  She states her understanding.   Review of Systems: No fevers or chills No numbness or tingling No chest pain No shortness of breath No bowel or bladder dysfunction No GI distress No headaches    Objective: There were no vitals taken for this visit.  Physical Exam:  General: Alert and oriented. and No acute distress. Gait: Normal gait.  Left shoulder without swelling.  She is able to forward flex the left shoulder to approximately 110 degrees, before she starts to arch her back.  15 degrees of external rotation at her side.  Internal rotation to back pocket.  Slightly improved compared to prior evaluations.  IMAGING: No new imaging obtained today    New Medications:  No orders of the defined types were placed in this encounter.     Tonita Frater, MD  06/30/2023 9:39 AM

## 2023-06-30 NOTE — Addendum Note (Signed)
 Addended by: [place] Snow T on: 06/30/2023 10:20 AM   Modules accepted: Orders

## 2023-06-30 NOTE — Patient Instructions (Signed)

## 2023-08-11 ENCOUNTER — Other Ambulatory Visit: Payer: Self-pay | Admitting: Gastroenterology

## 2023-08-11 DIAGNOSIS — R140 Abdominal distension (gaseous): Secondary | ICD-10-CM

## 2023-08-11 DIAGNOSIS — K859 Acute pancreatitis without necrosis or infection, unspecified: Secondary | ICD-10-CM

## 2023-09-19 ENCOUNTER — Other Ambulatory Visit: Payer: Self-pay | Admitting: Gastroenterology

## 2023-09-19 DIAGNOSIS — K859 Acute pancreatitis without necrosis or infection, unspecified: Secondary | ICD-10-CM

## 2023-09-19 DIAGNOSIS — R140 Abdominal distension (gaseous): Secondary | ICD-10-CM

## 2023-09-24 NOTE — Telephone Encounter (Signed)
 I am not sure if patient is still taking this medication.  Looks like last refill was in December 2024.  Please contact patient to find out if pantoprazole  is needed.  If so, I can send in a limited refill supply, and she will need to schedule an office visit.

## 2024-01-31 ENCOUNTER — Other Ambulatory Visit: Payer: Self-pay | Admitting: Neurology

## 2024-06-27 ENCOUNTER — Ambulatory Visit (HOSPITAL_COMMUNITY): Admitting: Registered Nurse
